# Patient Record
Sex: Female | Born: 1940 | Race: White | Hispanic: No | Smoking: Former smoker
Health system: Southern US, Community
[De-identification: ages and names within clinical notes are randomized; demographics above are authoritative.]

## PROBLEM LIST (undated history)

## (undated) DIAGNOSIS — C801 Malignant (primary) neoplasm, unspecified: Secondary | ICD-10-CM

## (undated) DIAGNOSIS — I10 Essential (primary) hypertension: Secondary | ICD-10-CM

## (undated) DIAGNOSIS — E782 Mixed hyperlipidemia: Secondary | ICD-10-CM

## (undated) DIAGNOSIS — G629 Polyneuropathy, unspecified: Secondary | ICD-10-CM

## (undated) DIAGNOSIS — E538 Deficiency of other specified B group vitamins: Secondary | ICD-10-CM

## (undated) DIAGNOSIS — E119 Type 2 diabetes mellitus without complications: Secondary | ICD-10-CM

## (undated) DIAGNOSIS — I480 Paroxysmal atrial fibrillation: Secondary | ICD-10-CM

## (undated) DIAGNOSIS — I272 Pulmonary hypertension, unspecified: Secondary | ICD-10-CM

## (undated) DIAGNOSIS — J449 Chronic obstructive pulmonary disease, unspecified: Secondary | ICD-10-CM

## (undated) DIAGNOSIS — E049 Nontoxic goiter, unspecified: Secondary | ICD-10-CM

## (undated) DIAGNOSIS — Z9889 Other specified postprocedural states: Secondary | ICD-10-CM

## (undated) DIAGNOSIS — K279 Peptic ulcer, site unspecified, unspecified as acute or chronic, without hemorrhage or perforation: Secondary | ICD-10-CM

## (undated) DIAGNOSIS — Z853 Personal history of malignant neoplasm of breast: Secondary | ICD-10-CM

## (undated) DIAGNOSIS — K900 Celiac disease: Secondary | ICD-10-CM

## (undated) HISTORY — DX: Essential (primary) hypertension: I10

## (undated) HISTORY — PX: MASTECTOMY: SHX3

## (undated) HISTORY — PX: DILATION AND CURETTAGE OF UTERUS: SHX78

## (undated) HISTORY — DX: Type 2 diabetes mellitus without complications: E11.9

## (undated) HISTORY — DX: Paroxysmal atrial fibrillation: I48.0

## (undated) HISTORY — PX: HYSTEROSCOPY W/ ENDOMETRIAL ABLATION: SUR665

## (undated) HISTORY — DX: Peptic ulcer, site unspecified, unspecified as acute or chronic, without hemorrhage or perforation: K27.9

## (undated) HISTORY — DX: Other specified postprocedural states: Z98.890

## (undated) HISTORY — DX: Nontoxic goiter, unspecified: E04.9

## (undated) HISTORY — DX: Personal history of malignant neoplasm of breast: Z85.3

## (undated) HISTORY — DX: Celiac disease: K90.0

## (undated) HISTORY — DX: Pulmonary hypertension, unspecified: I27.20

## (undated) HISTORY — DX: Mixed hyperlipidemia: E78.2

## (undated) HISTORY — DX: Malignant (primary) neoplasm, unspecified: C80.1

## (undated) HISTORY — DX: Chronic obstructive pulmonary disease, unspecified: J44.9

---

## 1996-11-14 ENCOUNTER — Encounter: Payer: Self-pay | Admitting: Internal Medicine

## 2004-05-02 ENCOUNTER — Inpatient Hospital Stay (HOSPITAL_COMMUNITY): Admission: AD | Admit: 2004-05-02 | Discharge: 2004-05-03 | Payer: Self-pay | Admitting: Cardiology

## 2004-05-02 ENCOUNTER — Ambulatory Visit: Payer: Self-pay | Admitting: Cardiology

## 2004-05-21 ENCOUNTER — Ambulatory Visit: Payer: Self-pay | Admitting: Cardiology

## 2005-10-25 ENCOUNTER — Ambulatory Visit: Payer: Self-pay | Admitting: Internal Medicine

## 2009-01-16 ENCOUNTER — Encounter: Payer: Self-pay | Admitting: Internal Medicine

## 2009-01-19 ENCOUNTER — Encounter: Payer: Self-pay | Admitting: Internal Medicine

## 2009-01-23 ENCOUNTER — Ambulatory Visit: Payer: Self-pay | Admitting: Cardiology

## 2009-01-23 ENCOUNTER — Encounter: Payer: Self-pay | Admitting: Internal Medicine

## 2009-01-24 ENCOUNTER — Encounter: Payer: Self-pay | Admitting: Internal Medicine

## 2009-01-24 ENCOUNTER — Telehealth: Payer: Self-pay | Admitting: Internal Medicine

## 2009-01-26 ENCOUNTER — Encounter: Payer: Self-pay | Admitting: Internal Medicine

## 2009-01-26 DIAGNOSIS — E785 Hyperlipidemia, unspecified: Secondary | ICD-10-CM

## 2009-01-26 DIAGNOSIS — I4949 Other premature depolarization: Secondary | ICD-10-CM

## 2009-01-26 DIAGNOSIS — R002 Palpitations: Secondary | ICD-10-CM

## 2009-01-26 DIAGNOSIS — I10 Essential (primary) hypertension: Secondary | ICD-10-CM

## 2009-01-31 ENCOUNTER — Ambulatory Visit: Payer: Self-pay | Admitting: Internal Medicine

## 2009-01-31 DIAGNOSIS — E049 Nontoxic goiter, unspecified: Secondary | ICD-10-CM | POA: Insufficient documentation

## 2009-01-31 DIAGNOSIS — I2789 Other specified pulmonary heart diseases: Secondary | ICD-10-CM | POA: Insufficient documentation

## 2009-02-05 ENCOUNTER — Encounter: Payer: Self-pay | Admitting: Internal Medicine

## 2009-02-12 ENCOUNTER — Encounter: Payer: Self-pay | Admitting: Internal Medicine

## 2009-02-21 ENCOUNTER — Encounter: Payer: Self-pay | Admitting: Internal Medicine

## 2009-02-23 ENCOUNTER — Encounter: Payer: Self-pay | Admitting: Internal Medicine

## 2009-02-27 ENCOUNTER — Ambulatory Visit: Payer: Self-pay | Admitting: Internal Medicine

## 2009-02-27 DIAGNOSIS — R0602 Shortness of breath: Secondary | ICD-10-CM

## 2009-02-28 LAB — CONVERTED CEMR LAB
BUN: 14 mg/dL (ref 6–23)
Basophils Absolute: 0.1 10*3/uL (ref 0.0–0.1)
Basophils Relative: 1.5 % (ref 0.0–3.0)
CO2: 34 meq/L — ABNORMAL HIGH (ref 19–32)
Calcium: 9.2 mg/dL (ref 8.4–10.5)
Chloride: 98 meq/L (ref 96–112)
Creatinine, Ser: 0.7 mg/dL (ref 0.4–1.2)
Eosinophils Absolute: 0.4 10*3/uL (ref 0.0–0.7)
Eosinophils Relative: 4.5 % (ref 0.0–5.0)
GFR calc non Af Amer: 87.89 mL/min (ref >60)
Glucose, Bld: 201 mg/dL — ABNORMAL HIGH (ref 70–99)
HCT: 38.5 % (ref 36.0–46.0)
Hemoglobin: 12.6 g/dL (ref 12.0–15.0)
INR: 1.1 — ABNORMAL HIGH (ref 0.8–1.0)
Lymphocytes Relative: 17 % (ref 12.0–46.0)
Lymphs Abs: 1.3 10*3/uL (ref 0.7–4.0)
MCHC: 32.7 g/dL (ref 30.0–36.0)
MCV: 89.4 fL (ref 78.0–100.0)
Monocytes Absolute: 0.8 10*3/uL (ref 0.1–1.0)
Monocytes Relative: 10 % (ref 3.0–12.0)
Neutro Abs: 5.3 10*3/uL (ref 1.4–7.7)
Neutrophils Relative %: 67 % (ref 43.0–77.0)
Platelets: 247 10*3/uL (ref 150.0–400.0)
Potassium: 4 meq/L (ref 3.5–5.1)
Prothrombin Time: 11 s (ref 9.1–11.7)
RBC: 4.3 M/uL (ref 3.87–5.11)
RDW: 14.5 % (ref 11.5–14.6)
Sodium: 138 meq/L (ref 135–145)
WBC: 7.9 10*3/uL (ref 4.5–10.5)

## 2009-03-02 ENCOUNTER — Ambulatory Visit: Payer: Self-pay | Admitting: Internal Medicine

## 2009-03-02 ENCOUNTER — Inpatient Hospital Stay (HOSPITAL_BASED_OUTPATIENT_CLINIC_OR_DEPARTMENT_OTHER): Admission: RE | Admit: 2009-03-02 | Discharge: 2009-03-02 | Payer: Self-pay | Admitting: Internal Medicine

## 2009-03-09 ENCOUNTER — Telehealth: Payer: Self-pay | Admitting: Internal Medicine

## 2009-03-09 ENCOUNTER — Telehealth (INDEPENDENT_AMBULATORY_CARE_PROVIDER_SITE_OTHER): Payer: Self-pay | Admitting: *Deleted

## 2009-03-20 ENCOUNTER — Other Ambulatory Visit: Admission: RE | Admit: 2009-03-20 | Discharge: 2009-03-20 | Payer: Self-pay | Admitting: Interventional Radiology

## 2009-03-20 ENCOUNTER — Encounter: Payer: Self-pay | Admitting: Internal Medicine

## 2009-03-20 ENCOUNTER — Encounter: Admission: RE | Admit: 2009-03-20 | Discharge: 2009-03-20 | Payer: Self-pay | Admitting: Internal Medicine

## 2009-04-02 ENCOUNTER — Encounter: Payer: Self-pay | Admitting: Internal Medicine

## 2009-04-03 ENCOUNTER — Ambulatory Visit: Payer: Self-pay | Admitting: Internal Medicine

## 2009-04-03 DIAGNOSIS — R609 Edema, unspecified: Secondary | ICD-10-CM

## 2009-04-05 ENCOUNTER — Ambulatory Visit: Payer: Self-pay | Admitting: Cardiology

## 2009-04-05 ENCOUNTER — Encounter: Payer: Self-pay | Admitting: Internal Medicine

## 2009-04-06 LAB — CONVERTED CEMR LAB
ALT: 13 U/L
AST: 13 U/L
Albumin: 3.7 g/dL
Alkaline Phosphatase: 74 U/L
BUN: 14 mg/dL
Bilirubin, Direct: 0.1 mg/dL
CO2: 34 meq/L — ABNORMAL HIGH
Calcium: 9.5 mg/dL
Chloride: 100 meq/L
Creatinine, Ser: 0.8 mg/dL
GFR calc non Af Amer: 75.32 mL/min
Glucose, Bld: 217 mg/dL — ABNORMAL HIGH
Potassium: 4 meq/L
Pro B Natriuretic peptide (BNP): 224 pg/mL — ABNORMAL HIGH
Sodium: 138 meq/L
Total Bilirubin: 0.8 mg/dL
Total Protein: 6.3 g/dL

## 2009-04-24 ENCOUNTER — Encounter: Payer: Self-pay | Admitting: Internal Medicine

## 2009-06-11 ENCOUNTER — Ambulatory Visit: Payer: Self-pay | Admitting: Internal Medicine

## 2009-06-20 ENCOUNTER — Encounter: Payer: Self-pay | Admitting: Internal Medicine

## 2009-06-21 ENCOUNTER — Telehealth: Payer: Self-pay | Admitting: Internal Medicine

## 2009-07-10 ENCOUNTER — Ambulatory Visit: Payer: Self-pay | Admitting: Cardiology

## 2009-07-11 ENCOUNTER — Encounter: Payer: Self-pay | Admitting: Cardiology

## 2009-07-11 ENCOUNTER — Encounter (INDEPENDENT_AMBULATORY_CARE_PROVIDER_SITE_OTHER): Payer: Self-pay | Admitting: *Deleted

## 2009-07-13 ENCOUNTER — Ambulatory Visit: Payer: Self-pay | Admitting: Cardiology

## 2009-07-13 LAB — CONVERTED CEMR LAB: POC INR: 1.1

## 2009-07-16 ENCOUNTER — Encounter: Payer: Self-pay | Admitting: Cardiology

## 2009-07-17 ENCOUNTER — Ambulatory Visit: Payer: Self-pay | Admitting: Cardiology

## 2009-07-17 ENCOUNTER — Encounter: Payer: Self-pay | Admitting: Cardiology

## 2009-07-17 LAB — CONVERTED CEMR LAB: POC INR: 1.6

## 2009-07-19 ENCOUNTER — Encounter (INDEPENDENT_AMBULATORY_CARE_PROVIDER_SITE_OTHER): Payer: Self-pay | Admitting: *Deleted

## 2009-07-20 ENCOUNTER — Ambulatory Visit: Payer: Self-pay | Admitting: Cardiology

## 2009-07-20 LAB — CONVERTED CEMR LAB: POC INR: 2.6

## 2009-07-23 ENCOUNTER — Encounter: Payer: Self-pay | Admitting: Cardiology

## 2009-07-24 ENCOUNTER — Ambulatory Visit: Payer: Self-pay | Admitting: Cardiology

## 2009-07-24 LAB — CONVERTED CEMR LAB: POC INR: 2.3

## 2009-08-03 ENCOUNTER — Ambulatory Visit: Payer: Self-pay | Admitting: Cardiology

## 2009-08-03 LAB — CONVERTED CEMR LAB: POC INR: 2.8

## 2009-08-17 ENCOUNTER — Ambulatory Visit: Payer: Self-pay | Admitting: Cardiology

## 2009-08-17 LAB — CONVERTED CEMR LAB: POC INR: 2.3

## 2009-08-27 ENCOUNTER — Ambulatory Visit: Payer: Self-pay | Admitting: Cardiology

## 2009-08-27 DIAGNOSIS — I4891 Unspecified atrial fibrillation: Secondary | ICD-10-CM | POA: Insufficient documentation

## 2009-09-05 ENCOUNTER — Telehealth (INDEPENDENT_AMBULATORY_CARE_PROVIDER_SITE_OTHER): Payer: Self-pay | Admitting: *Deleted

## 2009-09-07 ENCOUNTER — Ambulatory Visit: Payer: Self-pay | Admitting: Cardiology

## 2009-09-07 LAB — CONVERTED CEMR LAB: POC INR: 3.1

## 2009-09-12 ENCOUNTER — Ambulatory Visit: Payer: Self-pay | Admitting: Internal Medicine

## 2009-09-18 ENCOUNTER — Telehealth (INDEPENDENT_AMBULATORY_CARE_PROVIDER_SITE_OTHER): Payer: Self-pay | Admitting: *Deleted

## 2009-09-18 ENCOUNTER — Ambulatory Visit: Payer: Self-pay | Admitting: Cardiology

## 2009-09-26 ENCOUNTER — Ambulatory Visit: Payer: Self-pay

## 2009-09-26 ENCOUNTER — Ambulatory Visit: Payer: Self-pay | Admitting: Cardiovascular Disease

## 2009-09-26 ENCOUNTER — Ambulatory Visit (HOSPITAL_COMMUNITY): Admission: RE | Admit: 2009-09-26 | Discharge: 2009-09-26 | Payer: Self-pay | Admitting: Internal Medicine

## 2009-09-28 ENCOUNTER — Ambulatory Visit: Payer: Self-pay | Admitting: Cardiology

## 2009-09-28 LAB — CONVERTED CEMR LAB: POC INR: 4.2

## 2009-10-12 ENCOUNTER — Ambulatory Visit: Payer: Self-pay | Admitting: Cardiology

## 2009-10-12 LAB — CONVERTED CEMR LAB: POC INR: 3.1

## 2009-11-02 ENCOUNTER — Ambulatory Visit: Payer: Self-pay | Admitting: Cardiology

## 2009-11-02 LAB — CONVERTED CEMR LAB: POC INR: 2.6

## 2009-11-23 ENCOUNTER — Ambulatory Visit: Payer: Self-pay | Admitting: Cardiology

## 2009-11-23 LAB — CONVERTED CEMR LAB: POC INR: 3.2

## 2009-12-14 ENCOUNTER — Ambulatory Visit: Payer: Self-pay | Admitting: Cardiology

## 2009-12-14 LAB — CONVERTED CEMR LAB: POC INR: 1.9

## 2009-12-25 ENCOUNTER — Ambulatory Visit: Payer: Self-pay | Admitting: Cardiology

## 2009-12-25 ENCOUNTER — Ambulatory Visit: Payer: Self-pay | Admitting: Internal Medicine

## 2009-12-25 LAB — CONVERTED CEMR LAB: POC INR: 2.1

## 2010-01-01 ENCOUNTER — Encounter: Payer: Self-pay | Admitting: Internal Medicine

## 2010-01-06 ENCOUNTER — Telehealth (INDEPENDENT_AMBULATORY_CARE_PROVIDER_SITE_OTHER): Payer: Self-pay | Admitting: *Deleted

## 2010-01-08 ENCOUNTER — Ambulatory Visit: Payer: Self-pay | Admitting: Internal Medicine

## 2010-01-08 ENCOUNTER — Ambulatory Visit: Admission: RE | Admit: 2010-01-08 | Payer: Self-pay | Source: Home / Self Care | Admitting: Internal Medicine

## 2010-01-09 ENCOUNTER — Telehealth: Payer: Self-pay | Admitting: Internal Medicine

## 2010-01-18 ENCOUNTER — Ambulatory Visit: Payer: Self-pay

## 2010-01-22 ENCOUNTER — Encounter: Payer: Self-pay | Admitting: Internal Medicine

## 2010-02-12 ENCOUNTER — Telehealth: Payer: Self-pay | Admitting: Internal Medicine

## 2010-02-15 LAB — BASIC METABOLIC PANEL
BUN: 14 mg/dL (ref 6–23)
CO2: 30 mEq/L (ref 19–32)
Calcium: 9.4 mg/dL (ref 8.4–10.5)
Chloride: 99 mEq/L (ref 96–112)
Creatinine, Ser: 1.27 mg/dL — ABNORMAL HIGH (ref 0.4–1.2)
GFR calc Af Amer: 50 mL/min — ABNORMAL LOW (ref >60)
GFR calc non Af Amer: 41 mL/min — ABNORMAL LOW (ref >60)
Glucose, Bld: 187 mg/dL — ABNORMAL HIGH (ref 70–99)
Potassium: 4.8 mEq/L (ref 3.5–5.1)
Sodium: 136 mEq/L (ref 135–145)

## 2010-02-15 LAB — APTT: aPTT: 32 seconds (ref 24–37)

## 2010-02-15 LAB — CBC
HCT: 36.2 % (ref 36.0–46.0)
Hemoglobin: 11.7 g/dL — ABNORMAL LOW (ref 12.0–15.0)
MCH: 29.1 pg (ref 26.0–34.0)
MCHC: 32.3 g/dL (ref 30.0–36.0)
MCV: 90 fL (ref 78.0–100.0)
Platelets: 318 10*3/uL (ref 150–400)
RBC: 4.02 MIL/uL (ref 3.87–5.11)
RDW: 13 % (ref 11.5–15.5)
WBC: 10.6 10*3/uL — ABNORMAL HIGH (ref 4.0–10.5)

## 2010-02-15 LAB — PROTIME-INR
INR: 0.96 (ref 0.00–1.49)
Prothrombin Time: 13 seconds (ref 11.6–15.2)

## 2010-02-22 ENCOUNTER — Telehealth (INDEPENDENT_AMBULATORY_CARE_PROVIDER_SITE_OTHER): Payer: Self-pay | Admitting: *Deleted

## 2010-02-22 ENCOUNTER — Ambulatory Visit (HOSPITAL_COMMUNITY)
Admission: RE | Admit: 2010-02-22 | Discharge: 2010-02-22 | Payer: Self-pay | Source: Home / Self Care | Attending: Obstetrics and Gynecology | Admitting: Obstetrics and Gynecology

## 2010-02-25 LAB — GLUCOSE, CAPILLARY
Glucose-Capillary: 215 mg/dL — ABNORMAL HIGH (ref 70–99)
Glucose-Capillary: 178 mg/dL — ABNORMAL HIGH (ref 70–99)

## 2010-03-01 NOTE — Op Note (Signed)
  NAMEPENNYE, Wanda [name]              ACCOUNT NO.:  [id number]  MEDICAL RECORD NO.:  [id number]          PATIENT TYPE:  AMB  LOCATION:  [place]                           FACILITY:  [facility]  PHYSICIAN:  Lana Flaim L. Spyros Winch, M.D.DATE OF BIRTH:  [birth year]  DATE OF PROCEDURE:  02/22/2010 DATE OF DISCHARGE:                              OPERATIVE REPORT   PREOPERATIVE DIAGNOSIS:  Postmenopausal bleeding.  POSTOPERATIVE DIAGNOSES:  Postmenopausal bleeding, endometrial polyp and submucosal fibroid.  PROCEDURE:  Dilation and curettage, hysteroscopy, ThermaChoice endometrial ablation.  SURGEON:  Trini Christiansen L. Helane Rima, MD  ANESTHESIA:  MAC with paracervical block.  FINDINGS:  Multiple endometrial polyps and submucosal fibroids sent to Pathology.  ESTIMATED BLOOD LOSS:  Minimal.  COMPLICATIONS:  None.  DESCRIPTION OF PROCEDURE:  The patient was taken to the operating room. She was prepped and draped.  In-and-out catheter was used to empty the bladder.  Speculum was inserted into the vagina.  The cervix was grasped with a tenaculum.  Paracervical block was performed.  The cervical internal os was gently dilated using Pratt dilators and the hysteroscope was inserted.  The uterine cavity was large, there were several small to medium-sized polyps and there was a very large submucosal fibroid that took up almost the posterior wall of the uterus and the hysteroscope was removed and I performed uterine curettage with a curette, retrieving a moderate amount of tissue in use of polyp forceps multiple times to remove all of the polypoid tissue.  I then reinserted the hysteroscope and noted that the uterine cavity was cleaned except for the submucosal fibroid which was way too large to resect by the hysteroscope.  I then inserted the ThermaChoice III machine and performed a ThermaChoice endometrial ablation according to the manufacturer's specifications for an 8-minute cycle.  At the end of the procedure,  the intact balloon was removed, all instruments were removed from the vagina.  All sponge, lap, and instrument counts were correct x2.  The patient went to recovery room in stable condition.     Malayna Noori L. Helane Rima, M.D.    Nevin Bloodgood  D:  02/22/2010  T:  02/22/2010  Job:  [id number]  Electronically Signed by Dian Queen M.D. on 03/01/2010 08:43:58 AM

## 2010-03-05 ENCOUNTER — Ambulatory Visit: Admission: RE | Admit: 2010-03-05 | Discharge: 2010-03-05 | Payer: Self-pay | Source: Home / Self Care

## 2010-03-12 NOTE — Medication Information (Signed)
Summary: ccr-lr  Anticoagulant Therapy  Managed by: Edrick Oh, RN PCP: Lennie Hummer Supervising MD: Dannielle Burn MD, Luvenia Heller Indication 1: Atrial Fibrillation Lab Used: [facility] [place] Site: [place] INR POC 4.2  Dietary changes: no    Health status changes: no    Bleeding/hemorrhagic complications: no    Recent/future hospitalizations: no    Any changes in medication regimen? no    Recent/future dental: no  Any missed doses?: no       Is patient compliant with meds? yes       Allergies: 1)  ! * Dilaudid 2)  ! Pcn 3)  ! Prednisone 4)  ! * Gluten  Anticoagulation Management History:      The patient is taking warfarin and comes in today for a routine follow up visit.  Positive risk factors for bleeding include an age of 18 years or older.  The bleeding index is 'intermediate risk'.  Positive CHADS2 values include History of HTN.  Negative CHADS2 values include Age > 67 years old.  Her last INR was 1.1 ratio.  Anticoagulation responsible provider: Dannielle Burn MD, Luvenia Heller.  INR POC: 4.2.  Cuvette Lot#: [id number].  Exp: 09/2010.    Anticoagulation Management Assessment/Plan:      The patient's current anticoagulation dose is Warfarin sodium 5 mg tabs: use as directed per Anticoagulative Clinic.  The target INR is 2.0-3.0.  The next INR is due 10/12/2009.  Anticoagulation instructions were given to patient.  Results were reviewed/authorized by Edrick Oh, RN.  She was notified by Edrick Oh RN.         Prior Anticoagulation Instructions: INR 3.1 Take coumadin 1/2 tablet tonight then resume 1 1/2 tablets once daily   Current Anticoagulation Instructions: INR 4.2 Hold coumadin tonight then decrease dose to 7.43m once daily except 540mon Tuesdays and Saturdays

## 2010-03-12 NOTE — Medication Information (Signed)
Summary: ccr-lr  Anticoagulant Therapy  Managed by: Edrick Oh, RN PCP: Lennie Hummer Supervising MD: Dannielle Burn MD, Luvenia Heller Indication 1: Atrial Fibrillation Lab Used: [facility] [place] Site: [place] INR POC 2.3  Dietary changes: no    Health status changes: no    Bleeding/hemorrhagic complications: no    Recent/future hospitalizations: no    Any changes in medication regimen? no    Recent/future dental: no  Any missed doses?: no       Is patient compliant with meds? yes       Allergies: 1)  ! * Dilaudid 2)  ! Pcn 3)  ! Prednisone 4)  ! * Gluten  Anticoagulation Management History:      The patient is taking warfarin and comes in today for a routine follow up visit.  Positive risk factors for bleeding include an age of 72 years or older.  The bleeding index is 'intermediate risk'.  Positive CHADS2 values include History of HTN.  Negative CHADS2 values include Age > 3 years old.  Her last INR was 1.1 ratio.  Anticoagulation responsible provider: Dannielle Burn MD, Luvenia Heller.  INR POC: 2.3.  Cuvette Lot#: [id number].    Anticoagulation Management Assessment/Plan:      The next INR is due 08/03/2009.  Anticoagulation instructions were given to patient.  Results were reviewed/authorized by Edrick Oh, RN.  She was notified by Edrick Oh RN.        Coagulation management information includes: Not pending DCCV at this time.  Prior Anticoagulation Instructions: INR 2.6 Continue coumadin 7.50m once daily  Stop Lovenox Recheck INR 07/24/09  Current Anticoagulation Instructions: INR 2.3 Continue coumadin 7.584monce daily

## 2010-03-12 NOTE — Medication Information (Signed)
Summary: ccr-lr  Anticoagulant Therapy  Managed by: Porfirio Oar, PharmD PCP: Lennie Hummer Supervising MD: Dannielle Burn MD, Luvenia Heller Indication 1: Atrial Fibrillation Lab Used: [facility] [place] Site: [place] INR POC 2.8  Dietary changes: no    Health status changes: no    Bleeding/hemorrhagic complications: no    Recent/future hospitalizations: no    Any changes in medication regimen? no    Recent/future dental: no  Any missed doses?: no       Is patient compliant with meds? yes       Allergies: 1)  ! * Dilaudid 2)  ! Pcn 3)  ! Prednisone 4)  ! * Gluten  Anticoagulation Management History:      The patient is taking warfarin and comes in today for a routine follow up visit.  Positive risk factors for bleeding include an age of 72 years or older.  The bleeding index is 'intermediate risk'.  Positive CHADS2 values include History of HTN.  Negative CHADS2 values include Age > 51 years old.  Her last INR was 1.1 ratio.  Anticoagulation responsible provider: Dannielle Burn MD, Luvenia Heller.  INR POC: 2.8.  Cuvette Lot#: [id number].  Exp: 09/2010.    Anticoagulation Management Assessment/Plan:      The next INR is due 08/17/2009.  Anticoagulation instructions were given to patient.  Results were reviewed/authorized by Porfirio Oar, PharmD.  She was notified by Porfirio Oar PharmD.         Prior Anticoagulation Instructions: INR 2.3 Continue coumadin 7.10m once daily   Current Anticoagulation Instructions: INR 2.8  Continue same dose of 1 1/2 tablets every day.

## 2010-03-12 NOTE — Letter (Signed)
Summary: [facility] D/C DR. DHRUV VYAS  [facility] D/C DR. DHRUV VYAS   Imported By: Delfino Lovett 07/24/2009 12:25:44  _____________________________________________________________________  External Attachment:    Type:   Image     Comment:   External Document

## 2010-03-12 NOTE — Progress Notes (Signed)
Summary: PATIENT C/O BEING IN A-FIB  Phone Note Call from Patient Call back at Home Phone [phone]   Summary of Call: patient called saying she thinks she is in a-fib with HR 126, and she just felt different. Nurse instructed patient to go to ED for evaluation. patient verbalized understanding of plan. Initial call taken by: Georgina Peer,  September 18, 2009 10:38 AM

## 2010-03-12 NOTE — Progress Notes (Signed)
Summary: Cardiology - Epistaxis and Vaginal Bleeding  Phone Note Call from Patient Call back at Home Phone [phone]   Caller: Patient Reason for Call: Talk to Doctor Summary of Call: Returned call from pt concerning vaginal bleeding x3 days and blood from her nose.  Pt states that she has had vaginal bleeding when she wipes for the past 3 days or so.  Denies toilet being filled with blood.  She has recently been treated for a UTI but has finished her abx and denies any pain with urination.  Pt also c/o several drops of blood from her nose when she wipes her nose.  Pt is on 3 L of O2 per n/c.  This occured 2-3 times while pt was sitting in church.  She has had no further episodes of epistaxis.  Pt is on coumadin for Afib and had an INR drawn on 11/22 which was 2.3.  She denies any change in medication and states complaince with coumadin instructions.  Pt is also scheduled for a RHC on 11/29.  Therefore, I have advised the patient to hold her coumadin tonight and have her INR redrawn in the morning.  Pt is willing to drive to [place] if necessary.  If INR is ok then I would suggest obtaining a UA to ensure UTI was cleared with abx.  I also feel that the epistaxis may be from the use of oxygen but again pt will have INR drawn to ensure it is not supratherapeutic.  Pt voiced understanding and appreciated the call back.  If she does not hear from our office by noon I have asked her to call.  Pt will report to the ER if bleeding from her nose or vagina can not be stopped.  Initial call taken by: Teressa Senter NP-PA,  January 06, 2010 8:18 PM     Appended Document: Cardiology - Epistaxis and Vaginal Bleeding Called pt.  Nose bleeds and vaginal bleeding has improved although she still has a little of both.  Pt does not want to come in for INR check today because she is scheduled for heart cath in the morning at 8:00am and INR will be checked there.  Appended Document: Cardiology - Epistaxis and  Vaginal Bleeding Will hold coumadin. i called Dr. Dian Queen this am to discuss. They will see her very soon.

## 2010-03-12 NOTE — Letter (Signed)
Summary: Risk analyst at [facility]. [address]   [place], [place] [address]   Phone: [phone]  Fax: [phone]        July 19, 2009 MRN: [id number]   [facility] [address] [place], [place]  [address]   Dear Ms. Mcgregory,  Your test ordered by Rande Lawman has been reviewed by your physician (or physician assistant) and was found to be normal or stable. Your physician (or physician assistant) felt no changes were needed at this time.  ____ Echocardiogram  ____ Cardiac Stress Test  ____ Lab Work  ____ Peripheral vascular study of arms, legs or neck  ____ CT scan or X-ray  ____ Lung or Breathing test  __X__ Other:  1)  hemoccult cards negative x 3 for blood                      2)  EKG within normal limits and maintaining normal sinus rhythm per Dr. Dannielle Burn.   Thank you.   Lovina Reach, LPN    Bryon Lions, M.D., F.A.C.C. Maceo Pro, M.D., F.A.C.C. Cammy Copa, M.D., F.A.C.C. Vonda Antigua, M.D., F.A.C.C. Vita Barley, M.D., F.A.C.C. Mare Ferrari, M.D., F.A.C.C. Emi Belfast, PA-C

## 2010-03-12 NOTE — Assessment & Plan Note (Signed)
Summary: 6 WK FU -PAT.CHECK OUT. 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 The patient also has a history of obesity, hypertension diabetes mellitus and COPD. Her DLCO is 43%. She has combined obstructive and restrictive lung disease. She has significant hypoxemia with hypercarbia at rest and is on oxygen therapy.PA pressures have been measured at 83 meters of mercury systolic. PVR 4.9 which units. LV function is within normal limits.  The patient was admitted several months ago with new onset atrial fibrillation associated with presyncope.in the interim she has gone in and out of atrial fibrillation. However her symptoms of dyspnea have not worsened after rate control was obtained. The patient previously that I would have no plans on starting her on antiarrhythmic drug therapy. She is symptomatic from her pulmonary hypertension but not from paroxysmal atrial fibrillation with rate controlled currently.her blood pressure also is increased we did stop her ACE inhibitor and ARB during her hospitalization due to hypotension. I told her today that we didn't restart this.  The patient's compliant with Coumadin therapy and reports no complications.  Preventive Screening-Counseling & Management  Alcohol-Tobacco     Smoking Status: quit     Year Quit: 1997  Current Medications (verified): 1)  Chlorthalidone 25 Mg Tabs (Chlorthalidone) .Marland Kitchen.. 1 Tab Once Daily 2)  Aspirin 81 Mg Tbec (Aspirin) .... Take One Tablet By Mouth Daily 3)  Toprol Xl 100 Mg Xr24h-Tab (Metoprolol Succinate) .Marland Kitchen.. 1 Tab Once Daily 4)  Fluoxetine Hcl 40 Mg Caps (Fluoxetine Hcl) .Marland Kitchen.. 1 Cap Once Daily 5)  Actos 30 Mg Tabs (Pioglitazone Hcl) .Marland Kitchen.. 1 Tab Once Daily 6)   Omeprazole 20 Mg Tbec (Omeprazole) .Marland Kitchen.. 1 Tab Two Times A Day 7)  Spironolactone 25 Mg Tabs (Spironolactone) .... Take One Tablet By Mouth Daily 8)  Alprazolam 1 Mg Tabs (Alprazolam) .... At Bedtime 9)  Tybaso Nebulizer .... Qid 10)  Digoxin 0.125 Mg Tabs (Digoxin) .... Take 1 Tablet By Mouth Every Other Day 11)  Lovenox 100 Mg/ml Soln (Enoxaparin Sodium) .... Inject 181m Subcutaneously As Directed Two Times A Day 12)  Warfarin Sodium 5 Mg Tabs (Warfarin Sodium) .... Use As Directed Per Anticoagulative Clinic 13)  Tyvaso 0.6 Mg/ml Soln (Treprostinil) .... 9 Breaths Four Times A Day Per Nebulizer 14)  Lisinopril 20 Mg Tabs (Lisinopril) .... Take 1 Tablet By Mouth Once A Day  Allergies (verified): 1)  ! * Dilaudid 2)  ! Pcn 3)  ! Prednisone 4)  ! * Gluten  Comments:  Nurse/Medical Assistant: The patient's medication list and allergies were reviewed with the patient and were updated in the Medication and Allergy Lists.  Past History:  Past Medical History: Last updated: 04/03/2009 1. Hypertension, severe 2. Hyperlipidemia 3. PVCs 4. 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  Pulmoanry HTN      -- Right atrial pressure mean of 11, RV pressure 84/6 with an EDP of 18, PA pressure 83/28 with a mean of 49.  Pulmonary capillary wedge pressure 15.  Fick cardiac output 7.0 L/min.  Cardiac index 3.0 L/min/m2.  Pulmonary vascular resistance is 4.9 Woods units. 5. Diabetes 6. hx of breast cancer s/p L mastectomy 7. Celiac disease 8. Depression 9. COPD 10. Cor pulmonale 11. Morbid obesity 12. PUD  Family History: Last updated:  01/31/2009 Non-contributory  Social History: Last updated: 01/31/2009 Widowed. Former Therapist, art. Retired. H/o heavy tobaccu use. Quit 1997. No signifcant ETOH.   Social History: Smoking Status:  quit  Review of Systems       The patient complains of palpitations and shortness of breath.  The patient denies fatigue, malaise, fever, weight gain/loss,  vision loss, decreased hearing, hoarseness, chest pain, prolonged cough, wheezing, sleep apnea, coughing up blood, abdominal pain, blood in stool, nausea, vomiting, diarrhea, heartburn, incontinence, blood in urine, muscle weakness, joint pain, leg swelling, rash, skin lesions, headache, fainting, dizziness, depression, anxiety, enlarged lymph nodes, easy bruising or bleeding, and environmental allergies.    Vital Signs:  Patient profile:   70 year old female Height:      67 inches Weight:      255 pounds O2 Sat:      91 % on 2 L/min Pulse rate:   56 / minute BP sitting:   176 / 89  (left arm) Cuff size:   large  Vitals Entered By: Georgina Peer (August 27, 2009 10:00 AM)  O2 Flow:  2 L/min  Serial Vital Signs/Assessments:  Time      Position  BP       Pulse  Resp  Temp     By 10:06 AM            163/88   56                    Lydia Anderson                                PEF    PreRx  PostRx Time      O2 Sat  O2 Type     L/min  L/min  L/min   By 10:06 AM  92  %   2 L/min                           Georgina Peer   Physical Exam  Additional Exam:  General: Well-developed, well-nourished in no distress, wearing oxygen head: Normocephalic and atraumatic eyes PERRLA/EOMI intact, conjunctiva and lids normal nose: No deformity or lesions mouth normal dentition, normal posterior pharynx neck: Supple, no JVD.  No masses, thyromegaly or abnormal cervical nodes lungs: Normal breath sounds bilaterally without wheezing.  Normal percussion heart: Irregular rate and rhythm with normal S1 and S2, no S3 or S4.  PMI is normal.  No pathological murmurs abdomen: Normal bowel sounds, abdomen is soft and nontender without masses, organomegaly or hernias noted.  No hepatosplenomegaly musculoskeletal: Back normal, normal gait muscle strength and tone normal pulsus: Pulse is normal in all 4 extremities Extremities: trace peripheral pitting edema neurologic: Alert and oriented x 3 skin: Intact without  lesions or rashes cervical nodes: No significant adenopathy psychologic: Normal affect    EKG  Procedure date:  08/27/2009  Findings:      atrial fibrillation. Heart rate 62 beats per minute. No significant ST-T wave changes.  Impression & Recommendations:  Problem # 1:  ATRIAL FIBRILLATION (ICD-427.31) the patient is being paroxysmal atrial fibrillation. She is in atrial fibrillation the clinic today.however her rate is controlled and I will make no medication changes. Her updated medication list for this problem includes:    Aspirin 81 Mg Tbec (Aspirin) .Marland Kitchen... Take one tablet by mouth daily    Toprol Xl 100 Mg  Xr24h-tab (Metoprolol succinate) .Marland Kitchen... 1 tab once daily    Digoxin 0.125 Mg Tabs (Digoxin) .Marland Kitchen... Take 1 tablet by mouth every other day    Warfarin Sodium 5 Mg Tabs (Warfarin sodium) ..... Use as directed per anticoagulative clinic  Problem # 2:  PULMONARY HYPERTENSION (ICD-416.8) the patient is a treprostenil and further up titration of combination therapy will be decided by the [facility]  office.  Problem # 3:  COUMADIN THERAPY (ICD-V58.61) Assessment: Comment Only  Other Orders: EKG w/ Interpretation (93000)  Patient Instructions: 1)  Lisinopril 76m  2)  Follow up in  6 months Prescriptions: LISINOPRIL 20 MG TABS (LISINOPRIL) Take 1 tablet by mouth once a day  #30 x 6   Entered by:   GLovina Reach LPN   Authorized by:   GTerald Sleeper MD, [facility]  Signed by:   GLovina Reach LPN on 058/44/6520  Method used:   [facility]        [facility] [phone] (retail)       [address]  [address]      Ph: [phone]      Fax: [phone]  RxID:   [id number]

## 2010-03-12 NOTE — Medication Information (Signed)
Summary: [facility]  Anticoagulant Therapy  Managed by: Edrick Oh, RN PCP: Lennie Hummer Supervising MD: Dannielle Burn MD, Luvenia Heller Indication 1: Atrial Fibrillation Lab Used: [facility] [place] Site: [place] INR POC 2.1  Dietary changes: no    Health status changes: no    Bleeding/hemorrhagic complications: no    Recent/future hospitalizations: no    Any changes in medication regimen? no    Recent/future dental: no  Any missed doses?: no       Is patient compliant with meds? yes       Allergies: 1)  ! * Dilaudid 2)  ! Pcn 3)  ! Prednisone 4)  ! * Gluten  Anticoagulation Management History:      The patient is taking warfarin and comes in today for a routine follow up visit.  Positive risk factors for bleeding include an age of 72 years or older.  The bleeding index is 'intermediate risk'.  Positive CHADS2 values include History of HTN.  Negative CHADS2 values include Age > 72 years old.  Her last INR was 1.1 ratio.  Anticoagulation responsible provider: Dannielle Burn MD, Luvenia Heller.  INR POC: 2.1.  Cuvette Lot#: [id number].  Exp: 09/2010.    Anticoagulation Management Assessment/Plan:      The patient's current anticoagulation dose is Warfarin sodium 5 mg tabs: use as directed per Anticoagulative Clinic.  The target INR is 2.0-3.0.  The next INR is due 01/18/2010.  Anticoagulation instructions were given to patient.  Results were reviewed/authorized by Edrick Oh, RN.  She was notified by Edrick Oh RN.         Prior Anticoagulation Instructions: INR 1.9 Take coumadin 2 tablets tonight then resume 7.6m once daily except 517mon T,Th,Sat Will be starting Abx today for UTI  Current Anticoagulation Instructions: INR 2.1 Continue coumadin 7.58m3mnce daily except 58mg60m T,Th,Sat Cath scheduled for 01/08/10 Recheck INR 01/18/10

## 2010-03-12 NOTE — Medication Information (Signed)
Summary: ccr-lr  Anticoagulant Therapy  Managed by: Edrick Oh, RN PCP: Lennie Hummer Supervising MD: Domenic Polite MD, Mikeal Hawthorne Indication 1: Atrial Fibrillation Lab Used: [facility] [place] Site: [place] INR POC 3.1  Dietary changes: no    Health status changes: no    Bleeding/hemorrhagic complications: no    Recent/future hospitalizations: no    Any changes in medication regimen? no    Recent/future dental: no  Any missed doses?: no       Is patient compliant with meds? yes       Allergies: 1)  ! * Dilaudid 2)  ! Pcn 3)  ! Prednisone 4)  ! * Gluten  Anticoagulation Management History:      The patient is taking warfarin and comes in today for a routine follow up visit.  Positive risk factors for bleeding include an age of 72 years or older.  The bleeding index is 'intermediate risk'.  Positive CHADS2 values include History of HTN.  Negative CHADS2 values include Age > 33 years old.  Her last INR was 1.1 ratio.  Anticoagulation responsible provider: Domenic Polite MD, Mikeal Hawthorne.  INR POC: 3.1.  Cuvette Lot#: [id number].  Exp: 09/2010.    Anticoagulation Management Assessment/Plan:      The patient's current anticoagulation dose is Warfarin sodium 5 mg tabs: use as directed per Anticoagulative Clinic.  The target INR is 2.0-3.0.  The next INR is due 11/02/2009.  Anticoagulation instructions were given to patient.  Results were reviewed/authorized by Edrick Oh, RN.  She was notified by Edrick Oh RN.         Prior Anticoagulation Instructions: INR 4.2 Hold coumadin tonight then decrease dose to 7.60m once daily except 529mon Tuesdays and Saturdays  Current Anticoagulation Instructions: INR 3.1 Take coumadin 1 tablet tonight then resume 1 1/2 tablets once daily except 1 tablet on Tuesdays and Saturdays

## 2010-03-12 NOTE — Letter (Signed)
[facility]   Imported By: Marilynne Drivers 02/12/2009 10:09:00  _____________________________________________________________________  External Attachment:    Type:   Image     Comment:   External Document  Appended Document: [facility] - I discussed with Dr. Koleen Nimrod and he would like [name] to do RHC to get clearer assessment of volume status and pulmonary pressures. This is reasonable. Can you arrange with her?  thanks  Appended Document: [facility] cath sch for 1/21, pt aware

## 2010-03-12 NOTE — Assessment & Plan Note (Signed)
Summary: PER CHECK OUT/SF   Visit Type:  Follow-up Referring Provider:  Linde Gillis Primary Provider:  Lennie Hummer  CC:  no complaints.  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 we have been following her for pulmonary hypertension.  Found to be hypoxemic in Dec 2010 started on O2.  CT chest 12/10: No PE. Elevation of R hemidiaphragm. Areas of centrilobular emphysema. atelectasis in both bases. Large left lobe of thyroid with trachea deviation.  Echo 01/23/09: 55-60% mild LVH with pseudonormalizaiton. Mild to moderate MR. RV mild to moderately dilated. Mild RV dysfunction. Mild AS mean 62m HG.  RSVP 67  PFTs FEV1 1.23L (50%) FEV 1.81 (53%) DLCO 43% predicted.   Cath 1/11: RA 11, RV 84/6 with an EDP of 18, PA 83/28 with a mean of 49. PCWP 15  Fick cardiac output 7.0 L/min.  Cardiac index 3.0  PVR is 4.9 Woods units.  Started on Tyvaso in March 2011. Using 4 times a day. Doing much better. Was with her friends over the weeking at [employer] Was able to keep up with her friends while walking. Continues to go to pulmonary rehab and can walk 1/2 mile uses 6L of O2.   No edema or palpitations. BP at rehab 110-115/50-60s.   Current Medications (verified): 1)  Chlorthalidone 25 Mg Tabs (Chlorthalidone) ..Marland Kitchen. 1 Tab Once Daily 2)  Aspirin 81 Mg Tbec (Aspirin) .... Take One Tablet By Mouth Daily 3)  Lisinopril 40 Mg Tabs (Lisinopril) ..Marland Kitchen. 1 Tab Once Daily 4)  Toprol Xl 100 Mg Xr24h-Tab (Metoprolol Succinate) ..Marland Kitchen. 1 Tab Once Daily 5)  Diovan 320 Mg Tabs (Valsartan) ..Marland Kitchen. 1 Tab Once Daily 6)  Fluoxetine Hcl 40 Mg Caps (Fluoxetine Hcl) ..Marland Kitchen. 1 Cap Once Daily 7)  Actos 30 Mg Tabs (Pioglitazone Hcl) ..Marland Kitchen. 1 Tab Once Daily 8)  Omeprazole 20 Mg Tbec (Omeprazole) ..Marland Kitchen. 1 Tab Two Times A Day 9)  Spiriva Handihaler 18 Mcg Caps (Tiotropium Bromide Monohydrate)  .... Use As Directed At Bedtime 10)  Ventolin Hfa 108 (90 Base) Mcg/act Aers (Albuterol Sulfate) .... Use As Directed Three Times A Day 11)  Spironolactone 25 Mg Tabs (Spironolactone) .... Take One Tablet By Mouth Daily 12)  Alprazolam 1 Mg Tabs (Alprazolam) .... At Bedtime 13)  Tybaso Nebulizer .... Qid  Allergies (verified): 1)  ! * Dilaudid 2)  ! Pcn 3)  ! Prednisone 4)  ! * Gluten  Past History:  Past Medical History: Last updated: 04/03/2009 1. Hypertension, severe 2. Hyperlipidemia 3. PVCs 4. 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  Pulmoanry HTN      -- Right atrial pressure mean of 11, RV pressure 84/6 with an EDP of 18, PA pressure 83/28 with a mean of 49.  Pulmonary capillary wedge pressure 15.  Fick cardiac output 7.0 L/min.  Cardiac index 3.0 L/min/m2.  Pulmonary vascular resistance is 4.9 Woods units. 5. Diabetes 6. hx of breast cancer s/p L mastectomy 7. Celiac disease 8. Depression 9. COPD 10. Cor pulmonale 11. Morbid obesity 12. PUD  Review of Systems       As per HPI and past medical history; otherwise all systems negative.   Vital Signs:  Patient profile:   70year old female Height:      69 inches Weight:      253 pounds BMI:     37.50 Pulse rate:   54 /  minute Resp:     16 per minute BP sitting:   172 / 86  (right arm)  Vitals Entered By: Levora Angel, CNA (Jun 11, 2009 10:53 AM)  Physical Exam  General:  looks good. Walks briskly with O2. no resp difficulty.  HEENT: normal. + nasal cannula Neck: supple. JVP 6. Carotids 2+ bilat; no bruits. No lymphadenopathy or thryomegaly appreciated. Cor: PMI nondisplaced. Regular rate & rhythm. No rubs, gallops, 2/6 AS 2/6 TR  no RV heave + Left mastectomy Lungs: clear Abdomen: obese soft, nontender, nondistended. No hepatosplenomegaly. No bruits or masses. Good bowel sounds. Extremities: no cyanosis, clubbing, rash, edema Neuro: alert & orientedx3, cranial nerves grossly intact. moves all 4 extremities  w/o difficulty. affect pleasant    Impression & Recommendations:  Problem # 1:  PULMONARY HYPERTENSION (ICD-416.8) Doing great with Tyvaso. Now NYHA class II. Continue current therapy and pulmonary rehab. Follow 6 MW time with Dr. Koleen Nimrod.  Problem # 2:  HYPERTENSION, UNSPECIFIED (ICD-401.9) BP up today which is unusual for her. Will follow in pulmonary rehab. If still up can get back to [name] to adjust.   Other Orders: EKG w/ Interpretation (93000)  Patient Instructions: 1)  Follow up in 3 months

## 2010-03-12 NOTE — Medication Information (Signed)
Summary: CCN - SRS  Anticoagulant Therapy  Managed by: Edrick Oh, RN PCP: Lennie Hummer Supervising MD: Dannielle Burn MD, Luvenia Heller Indication 1: Atrial Fibrillation Lab Used: [facility] [place] Site: [place] INR POC 1.1  Dietary changes: no    Health status changes: no    Bleeding/hemorrhagic complications: no    Recent/future hospitalizations: yes       Details: Pt d/c from [facility] on 07/11/09  with new A Fib  Has H/O Pulm HTN  Any changes in medication regimen? yes       Details: Started on coumadin 28m qd   Has 588mtablet  Recent/future dental: no  Any missed doses?: no       Is patient compliant with meds? yes      Comments: coumadin teaching performed in the hospital.  Pt is a nurse.  Questions answered.  Allergies: 1)  ! * Dilaudid 2)  ! Pcn 3)  ! Prednisone 4)  ! * Gluten  Anticoagulation Management History:      The patient is taking warfarin and comes in today for a routine follow up visit.  Positive risk factors for bleeding include an age of [age]ears or older.  The bleeding index is 'intermediate risk'.  Positive CHADS2 values include History of HTN.  Negative CHADS2 values include Age > 7518ears old.  Her last INR was 1.1 ratio.  Anticoagulation responsible provider: DeDannielle BurnD, GuLuvenia Heller INR POC: 1.1.    Anticoagulation Management Assessment/Plan:      The next INR is due 07/17/2009.  Anticoagulation instructions were given to patient.  Results were reviewed/authorized by LiEdrick OhRN.  She was notified by LiEdrick OhN.         Current Anticoagulation Instructions: INR 1.1 Pt to take coumadin 1078monight and tomorrow night then increase dose to 7.5mg80mll next INR check on 07/17/09.

## 2010-03-12 NOTE — Medication Information (Signed)
Summary: ccr-lr  Anticoagulant Therapy  Managed by: Edrick Oh, RN PCP: Lennie Hummer Supervising MD: Dannielle Burn MD, Luvenia Heller Indication 1: Atrial Fibrillation Lab Used: [facility] [place] Site: [place] INR POC 2.6  Dietary changes: no    Health status changes: no    Bleeding/hemorrhagic complications: no    Recent/future hospitalizations: no    Any changes in medication regimen? no    Recent/future dental: no  Any missed doses?: no       Is patient compliant with meds? yes       Allergies: 1)  ! * Dilaudid 2)  ! Pcn 3)  ! Prednisone 4)  ! * Gluten  Anticoagulation Management History:      The patient is taking warfarin and comes in today for a routine follow up visit.  Positive risk factors for bleeding include an age of 72 years or older.  The bleeding index is 'intermediate risk'.  Positive CHADS2 values include History of HTN.  Negative CHADS2 values include Age > 28 years old.  Her last INR was 1.1 ratio.  Anticoagulation responsible provider: Dannielle Burn MD, Luvenia Heller.  INR POC: 2.6.  Cuvette Lot#: [id number].  Exp: 09/2010.    Anticoagulation Management Assessment/Plan:      The patient's current anticoagulation dose is Warfarin sodium 5 mg tabs: use as directed per Anticoagulative Clinic.  The target INR is 2.0-3.0.  The next INR is due 11/23/2009.  Anticoagulation instructions were given to patient.  Results were reviewed/authorized by Edrick Oh, RN.  She was notified by Edrick Oh RN.         Prior Anticoagulation Instructions: INR 3.1 Take coumadin 1 tablet tonight then resume 1 1/2 tablets once daily except 1 tablet on Tuesdays and Saturdays  Current Anticoagulation Instructions: INR 2.6 Continue coumadin 7.89m once daily except 541mon T,Sat

## 2010-03-12 NOTE — Miscellaneous (Signed)
Summary: Chronic DZ Progress Note  Chronic DZ Progress Note   Imported By: Sallee Provencal 05/21/2009 15:08:45  _____________________________________________________________________  External Attachment:    Type:   Image     Comment:   External Document

## 2010-03-12 NOTE — Progress Notes (Signed)
Summary: VERIFICATION OF MEDICATION(SPIRONOLACTON&CHLORTHALIDONE)  ---- Converted from flag ---- ---- 09/04/2009 6:03 PM, Terald Sleeper, MD, [facility] wrote: yes she is  ---- 09/04/2009 9:40 AM, Georgina Peer wrote: can you verify that she is to be on both spironolactone and chlorthalidone. thanks ------------------------------  Phone Note From Pharmacy   Caller: [facility] [phone]* Call For: nurse  Summary of Call: verification of patient being on spironolactone and chlorthalidone. Per MD,patientis on both. information faxed to pharmacy. Initial call taken by: Georgina Peer,  September 05, 2009 8:33 AM

## 2010-03-12 NOTE — Medication Information (Signed)
Summary: ccr-lr  Anticoagulant Therapy  Managed by: Edrick Oh, RN PCP: Lennie Hummer Supervising MD: Domenic Polite MD, Mikeal Hawthorne Indication 1: Atrial Fibrillation Lab Used: [facility] [place] Site: [place] INR POC 2.6  Dietary changes: no    Health status changes: no    Bleeding/hemorrhagic complications: no    Recent/future hospitalizations: no    Any changes in medication regimen? no    Recent/future dental: no  Any missed doses?: no       Is patient compliant with meds? yes       Allergies: 1)  ! * Dilaudid 2)  ! Pcn 3)  ! Prednisone 4)  ! * Gluten  Anticoagulation Management History:      The patient is taking warfarin and comes in today for a routine follow up visit.  Positive risk factors for bleeding include an age of 72 years or older.  The bleeding index is 'intermediate risk'.  Positive CHADS2 values include History of HTN.  Negative CHADS2 values include Age > 5 years old.  Her last INR was 1.1 ratio.  Anticoagulation responsible provider: Domenic Polite MD, Mikeal Hawthorne.  INR POC: 2.6.  Cuvette Lot#: [id number].    Anticoagulation Management Assessment/Plan:      The next INR is due 07/24/2009.  Anticoagulation instructions were given to patient.  Results were reviewed/authorized by Edrick Oh, RN.  She was notified by Edrick Oh RN.         Prior Anticoagulation Instructions: INR 1.6 Take coumadin 72m x 2 then resume 7.5106monce daily.  Recheck INR on 07/20/09 Continue lovenox  Current Anticoagulation Instructions: INR 2.6 Continue coumadin 7.35m46mnce daily  Stop Lovenox Recheck INR 07/24/09

## 2010-03-12 NOTE — Consult Note (Signed)
Summary: CARDIOLOGY CONSULT/ [facility]SULT/ [place]   Imported By: Delfino Lovett 07/16/2009 15:40:55  _____________________________________________________________________  External Attachment:    Type:   Image     Comment:   External Document

## 2010-03-12 NOTE — Medication Information (Signed)
Summary: ccr-lr  Anticoagulant Therapy  Managed by: Edrick Oh, RN PCP: Lennie Hummer Supervising MD: Domenic Polite MD, Mikeal Hawthorne Indication 1: Atrial Fibrillation Lab Used: [facility] [place] Site: [place] INR POC 3.1  Dietary changes: no    Health status changes: no    Bleeding/hemorrhagic complications: no    Recent/future hospitalizations: no    Any changes in medication regimen? no    Recent/future dental: no  Any missed doses?: no       Is patient compliant with meds? yes       Allergies: 1)  ! * Dilaudid 2)  ! Pcn 3)  ! Prednisone 4)  ! * Gluten  Anticoagulation Management History:      The patient is taking warfarin and comes in today for a routine follow up visit.  Positive risk factors for bleeding include an age of 68 years or older.  The bleeding index is 'intermediate risk'.  Positive CHADS2 values include History of HTN.  Negative CHADS2 values include Age > 6 years old.  Her last INR was 1.1 ratio.  Anticoagulation responsible provider: Domenic Polite MD, Mikeal Hawthorne.  INR POC: 3.1.  Exp: 09/2010.    Anticoagulation Management Assessment/Plan:      The patient's current anticoagulation dose is Warfarin sodium 5 mg tabs: use as directed per Anticoagulative Clinic.  The target INR is 2.0-3.0.  The next INR is due 09/28/2009.  Anticoagulation instructions were given to patient.  Results were reviewed/authorized by Edrick Oh, RN.  She was notified by Edrick Oh RN.         Prior Anticoagulation Instructions: INR 2.3 Continue coumadin 7.25m once daily   Current Anticoagulation Instructions: INR 3.1 Take coumadin 1/2 tablet tonight then resume 1 1/2 tablets once daily

## 2010-03-12 NOTE — Letter (Signed)
Summary: Cardiac Catheterization Instructions- [place], [place]  [address] [place]   [place],  [address]   Phone: [phone]  Fax: [phone]     02/23/2009 MRN: [id number]  [facility] [address] [place],   [address]  Dear Ms. Kagel,   You are scheduled for a Cardiac Catheterization on Fri 1/21 with  Dr. Haroldine Laws  Please arrive to the 1st floor of the Heart and Vascular Center at [facility] at 7:30 am / pm on the day of your procedure. Please do not arrive before 6:30 a.m. Call the Heart and Vascular Center at [phone] if you are unable to make your appointmnet. The Code to get into the parking garage under the building is 0090. Take the elevators to the 1st floor. You must have someone to drive you home. Someone must be with you for the first 24 hours after you arrive home. Please wear clothes that are easy to get on and off and wear slip-on shoes. Do not eat or drink after midnight except water with your medications that morning. Bring all your medications and current insurance cards with you.  _X__ DO NOT take these medications before your procedure: _______Chlorthalidone, Actos, Spironolactone  ___ Make sure you take your aspirin.  ___ You may take ALL of your medications with water that morning. ________________________________________________________________________________________________________________________________  ___ DO NOT take ANY medications before your procedure.  ___ Pre-med instructions:  ________________________________________________________________________________________________________________________________  The usual length of stay after your procedure is 2 to 3 hours. This can vary.  If you have any questions, please call the office at the number listed above.   Kevan Rosebush, RN

## 2010-03-12 NOTE — Medication Information (Signed)
Summary: ccr-lr  Anticoagulant Therapy  Managed by: Edrick Oh, RN PCP: Lennie Hummer Supervising MD: Dannielle Burn MD, Luvenia Heller Indication 1: Atrial Fibrillation Lab Used: [facility] [place] Site: [place] INR POC 1.6  Dietary changes: no    Health status changes: no    Bleeding/hemorrhagic complications: no    Recent/future hospitalizations: no    Any changes in medication regimen? yes       Details: still on Lovenox  Recent/future dental: no  Any missed doses?: no       Is patient compliant with meds? yes       Allergies: 1)  ! * Dilaudid 2)  ! Pcn 3)  ! Prednisone 4)  ! * Gluten  Anticoagulation Management History:      The patient is taking warfarin and comes in today for a routine follow up visit.  Positive risk factors for bleeding include an age of 72 years or older.  The bleeding index is 'intermediate risk'.  Positive CHADS2 values include History of HTN.  Negative CHADS2 values include Age > 72 years old.  Her last INR was 1.1 ratio.  Anticoagulation responsible provider: Dannielle Burn MD, Luvenia Heller.  INR POC: 1.6.    Anticoagulation Management Assessment/Plan:      The next INR is due 07/20/2009.  Anticoagulation instructions were given to patient.  Results were reviewed/authorized by Edrick Oh, RN.  She was notified by Edrick Oh RN.         Prior Anticoagulation Instructions: INR 1.1 Pt to take coumadin 26m tonight and tomorrow night then increase dose to 7.533mtill next INR check on 07/17/09.  Current Anticoagulation Instructions: INR 1.6 Take coumadin 108m 2 then resume 7.5mg32mce daily.  Recheck INR on 07/20/09 Continue lovenox

## 2010-03-12 NOTE — Assessment & Plan Note (Signed)
Summary: 3 month rov.sl   Visit Type:  Follow-up Referring Provider:  Linde Gillis Primary Provider:  Lennie Hummer  CC:  no complaints.  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 We have been following her for pulmonary hypertension.  Found to be hypoxemic in Dec 2010 started on O2. CT chest 12/10: No PE. Elevation of R hemidiaphragm. Areas of centrilobular emphysema. atelectasis in both bases. Large left lobe of thyroid with trachea deviation.  Echo 01/23/09: 55-60% mild LVH with pseudonormalizaiton. Mild to moderate MR. RV mild to moderately dilated. Mild RV dysfunction. Mild AS mean 61m HG.  RSVP 67. PFTs FEV1 1.23L (50%) FEV 1.81 (53%) DLCO 43% predicted.   Cath 1/11: RA 11, RV 84/6 with an EDP of 18, PA 83/28 with a mean of 49. PCWP 15  Fick cardiac output 7.0 L/min.  Cardiac index 3.0  PVR is 4.9 Woods units.  Started on Tyvaso (treprostinil) in March 2011. Using 4 times a day. Continues to do very well. Feels she can do what she wants. Has finished pulmonary rehab. Had 63MW recently with Dr. HKoleen Nimrod Results not available. Told to increase O2 to 3L at rest at 6L with exertion.  6MW (July 2011): 1952msats 92% rest (3L) ambulations sats to 83%.  Doing fine. Breathing continues to improve. Walks all round [employer] without problem. No problems with Tyvaso. No edema or dizziness.  Echo in 8/11. Normal LV. RV size and function totally normal (reviewed personally). No signifcant TR - unable to measure RV pressure. No recurrent AF.     Current Medications (verified): 1)  Chlorthalidone 25 Mg Tabs (Chlorthalidone) ...Marland Kitchen 1 Tab Once Daily 2)  Aspirin 81 Mg Tbec (Aspirin) .... Take One Tablet By Mouth Daily 3)  Fluoxetine Hcl 40 Mg Caps (Fluoxetine Hcl) ...Marland Kitchen 1 Cap Once Daily 4)  Omeprazole 20 Mg Tbec (Omeprazole) ...Marland Kitchen 1 Tab Once Daily 5)  Spironolactone  25 Mg Tabs (Spironolactone) .... Take One Tablet By Mouth Daily 6)  Alprazolam 1 Mg Tabs (Alprazolam) .... At Bedtime 7)  Digoxin 0.125 Mg Tabs (Digoxin) .... Take 1 Tablet By Mouth Every Other Day 8)  Warfarin Sodium 5 Mg Tabs (Warfarin Sodium) .... Use As Directed Per Anticoagulative Clinic 9)  Tyvaso 0.6 Mg/ml Soln (Treprostinil) .... 9 Breaths Four Times A Day Per Nebulizer 10)  Lisinopril 20 Mg Tabs (Lisinopril) .... Take 1 Tablet By Mouth Once A Day 11)  Diltiazem Hcl Er Beads 240 Mg Xr24h-Cap (Diltiazem Hcl Er Beads) .... Take One Capsule By Mouth Daily  Allergies (verified): 1)  ! * Dilaudid 2)  ! Pcn 3)  ! Prednisone 4)  ! * Gluten  Past History:  Past Medical History: Last updated: 04/03/2009 1. Hypertension, severe 2. Hyperlipidemia 3. PVCs 4. 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  Pulmoanry HTN      -- Right atrial pressure mean of 11, RV pressure 84/6 with an EDP of 18, PA pressure 83/28 with a mean of 49.  Pulmonary capillary wedge pressure 15.  Fick cardiac output 7.0 L/min.  Cardiac index 3.0 L/min/m2.  Pulmonary vascular resistance is 4.9 Woods units. 5. Diabetes 6. hx of breast cancer s/p L mastectomy 7. Celiac disease 8. Depression 9. COPD 10. Cor pulmonale 11. Morbid obesity 12. PUD  Review of Systems       As per HPI and past medical history; otherwise all systems  negative.   Vital Signs:  Patient profile:   70 year old female Height:      67 inches Weight:      250 pounds BMI:     39.30 Pulse rate:   67 / minute BP sitting:   128 / 70  (left arm) Cuff size:   large  Vitals Entered By: Mignon Pine, RMA (December 25, 2009 11:36 AM) CC: no complaints Comments 6 min walk performed, pt walked 1085f, oxygen was increased to 4L due to sats staying around 82-83% sats then came up to around 87% HKevan Rosebush RN  December 25, 2009 12:47 PM    Physical Exam  General:  looks good. Walks briskly with O2. no resp difficulty.  HEENT: normal. + nasal  cannula Neck: supple. JVP flat. Carotids 2+ bilat; no bruits. No lymphadenopathy or thryomegaly appreciated. Cor: PMI nondisplaced. Regular rate & rhythm. No rubs, gallops, 2/6 AS 2/6 TR  no RV heave + Left mastectomy Lungs: clear Abdomen: obese soft, nontender, nondistended. No hepatosplenomegaly. No bruits or masses. Good bowel sounds. Extremities: no cyanosis, clubbing, rash, edema Neuro: alert & orientedx3, cranial nerves grossly intact. moves all 4 extremities w/o difficulty. affect pleasant    New Orders:     1)  EKG w/ Interpretation (93000)  Due: 12/25/2009   Impression & Recommendations:  Problem # 1:  PULMONARY HYPERTENSION (ICD-416.8) Much improved by echo and symptomatically. Now NYHA II-III. Continue Tyvaso. Repeat 6MW today to assess functional capacity and assure adequate oxygenation. Repeat RHC in next few weeks to assess need for combination therapy.   Problem # 2:  ATRIAL FIBRILLATION (ICD-427.31) No recent re-occurence. Continue coumadin.   Other Orders: EKG w/ Interpretation (93000)  Patient Instructions: 1)  You are scheduled for a heart cath on Tue 11/29, please see instruction sheet given to you. 2)  Your physician recommends that you return for lab work in: week of 11/22 at your primary care MDs office, we have given you a prescription for this. 3)  Follow up in 4 months.

## 2010-03-12 NOTE — Letter (Signed)
Summary: [facility] - BP Readings   Imported By: Marilynne Drivers 06/21/2009 16:56:13  _____________________________________________________________________  External Attachment:    Type:   Image     Comment:   External Document

## 2010-03-12 NOTE — Progress Notes (Signed)
Summary: pt has med questions post hospital  Phone Note Call from Patient   Caller: Patient [phone] Reason for Call: Talk to Nurse Summary of Call: pt has questions re taking med and being a diabetic,also when can she resume coumadin? (today's her birthyday) Initial call taken by: Lorenda Hatchet,  January 09, 2010 9:07 AM  Follow-up for Phone Call        spoke w/pt and discussed w/Dr Bensimhon pt to hold coumadin until she sees Dr Helane Rima and she is to start metformin today and will f/u w/pcp regarding diabetes Kevan Rosebush, RN  January 09, 2010 2:08 PM

## 2010-03-12 NOTE — Miscellaneous (Signed)
Summary: Rehab Report/ CARDIAC REHAB DISCHARGE SUMMARY  Rehab Report/ CARDIAC REHAB DISCHARGE SUMMARY   Imported By: Bartholomew Boards 07/23/2009 16:31:57  _____________________________________________________________________  External Attachment:    Type:   Image     Comment:   External Document

## 2010-03-12 NOTE — Medication Information (Signed)
Summary: ccr-lr  Anticoagulant Therapy  Managed by: Edrick Oh, RN PCP: Lennie Hummer Supervising MD: Dannielle Burn MD, Luvenia Heller Indication 1: Atrial Fibrillation Lab Used: [facility] [place] Site: [place] INR POC 1.9  Dietary changes: no    Health status changes: no    Bleeding/hemorrhagic complications: no    Recent/future hospitalizations: no    Any changes in medication regimen? no    Recent/future dental: no  Any missed doses?: no       Is patient compliant with meds? yes       Allergies: 1)  ! * Dilaudid 2)  ! Pcn 3)  ! Prednisone 4)  ! * Gluten  Anticoagulation Management History:      Positive risk factors for bleeding include an age of 65 years or older.  The bleeding index is 'intermediate risk'.  Positive CHADS2 values include History of HTN.  Negative CHADS2 values include Age > 58 years old.  Her last INR was 1.1 ratio.  Anticoagulation responsible provider: Dannielle Burn MD, Luvenia Heller.  INR POC: 1.9.  Exp: 09/2010.    Anticoagulation Management Assessment/Plan:      The patient's current anticoagulation dose is Warfarin sodium 5 mg tabs: use as directed per Anticoagulative Clinic.  The target INR is 2.0-3.0.  The next INR is due 12/25/2009.  Anticoagulation instructions were given to patient.  Results were reviewed/authorized by Edrick Oh, RN.  She was notified by Edrick Oh RN.         Prior Anticoagulation Instructions: INR 3.2 Take 92m tonight then decrease dose to 7.556monce daily except 36m39mn T,Th,Sat  Current Anticoagulation Instructions: INR 1.9 Take coumadin 2 tablets tonight then resume 7.36mg52mce daily except 36mg 18mT,Th,Sat Will be starting Abx today for UTI

## 2010-03-12 NOTE — Progress Notes (Signed)
  Phone Note From Other Clinic   Caller: Dr.WIlliam Koleen Nimrod Details for Reason: Pt.Information Initial call taken by: Farley Ly    Faxed Cath over to [place]  March 09, 2009 8:49 AM

## 2010-03-12 NOTE — Medication Information (Signed)
Summary: ccr-lr  Anticoagulant Therapy  Managed by: Edrick Oh, RN PCP: Lennie Hummer Supervising MD: Dannielle Burn MD, Luvenia Heller Indication 1: Atrial Fibrillation Lab Used: [facility] [place] Site: [place] INR POC 3.2  Dietary changes: no    Health status changes: no    Bleeding/hemorrhagic complications: no    Recent/future hospitalizations: no    Any changes in medication regimen? no    Recent/future dental: no  Any missed doses?: no       Is patient compliant with meds? yes       Allergies: 1)  ! * Dilaudid 2)  ! Pcn 3)  ! Prednisone 4)  ! * Gluten  Anticoagulation Management History:      The patient is taking warfarin and comes in today for a routine follow up visit.  Positive risk factors for bleeding include an age of 37 years or older.  The bleeding index is 'intermediate risk'.  Positive CHADS2 values include History of HTN.  Negative CHADS2 values include Age > 38 years old.  Her last INR was 1.1 ratio.  Anticoagulation responsible Thamara Leger: Dannielle Burn MD, Luvenia Heller.  INR POC: 3.2.  Cuvette Lot#: [id number].  Exp: 09/2010.    Anticoagulation Management Assessment/Plan:      The patient's current anticoagulation dose is Warfarin sodium 5 mg tabs: use as directed per Anticoagulative Clinic.  The target INR is 2.0-3.0.  The next INR is due 11/23/2009.  Anticoagulation instructions were given to patient.  Results were reviewed/authorized by Edrick Oh, RN.  She was notified by Edrick Oh RN.         Prior Anticoagulation Instructions: INR 2.6 Continue coumadin 7.64m once daily except 511mon T,Sat  Current Anticoagulation Instructions: INR 3.2 Take 65m3monight then decrease dose to 7.65mg46mce daily except 65mg 42mT,Th,Sat

## 2010-03-12 NOTE — Miscellaneous (Signed)
Summary: Lovenox, Digoxin  Clinical Lists Changes  Medications: Added new medication of DIGOXIN 0.125 MG TABS (DIGOXIN) Take 1 tablet by mouth every other day - Signed Added new medication of LOVENOX 100 MG/ML SOLN (ENOXAPARIN SODIUM) Inject 135m subcutaneously as directed two times a day - Signed Rx of DIGOXIN 0.125 MG TABS (DIGOXIN) Take 1 tablet by mouth every other day;  #15 x 6;  Signed;  Entered by: JGurney Maxin RN, BSN;  Authorized by: GTerald Sleeper MD, FSwift [facility]  Method used: [phone], [address] [place]  [address] Ph: [phone] Fax: [phone] of LOVENOX 100 MG/ML SOLN (ENOXAPARIN SODIUM) Inject 1072msubcutaneously as directed two times a day;  #10 x 1;  Signed;  Entered by: JeGurney MaxinRN, BSN;  Authorized by: GuTerald SleeperMD, FAMethodist [facility] Method used: [facility] [phone] [address]  [address]Ph: [phone]: [phone]  Prescriptions: LOVENOX 100 MG/ML SOLN (ENOXAPARIN SODIUM) Inject 10089mubcutaneously as directed two times a day  #10 x 1   Entered by:   JenGurney MaxinN, BSN   Authorized by:   GuyTerald SleeperD, FACManati Medical Center Dr Alejandro Otero LopezSigned by:   JenGurney MaxinN, BSN on 07/11/2009   Method used:   [facility]        [phone]       [address]  [id number]    Ph: [phone]    Fax: [phone]:   [id number] 0.125 MG TABS (DIGOXIN) Take 1 tablet by mouth every other day  #15 x 6   Entered by:   JenGurney MaxinN, BSN   Authorized by:   GuyTerald SleeperD, FACIllinois Valley Community HospitalSigned by:   JenGurney MaxinN, BSN on 07/11/2009   Method used:   [facility]        [phone])       [address]  [address]    Ph: [phone]    Fax: [phone]:  :    [id number] Per Dr. De Lutricia Feilend prescriptions for Digoxin 0.125m51m mouth every other day and Lovenox 100mg96mcutaneously two times a [facility]. JenniGurney Maxin BSN  July 11, 2009 1:38 PM

## 2010-03-12 NOTE — Assessment & Plan Note (Signed)
Summary: 2 month rov.sl   Visit Type:  Follow-up Referring Provider:  Linde Gillis Primary Provider:  Lennie Hummer  CC:  Afib episode saw Dr Dannielle Burn.  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 We have been following her for pulmonary hypertension.  Found to be hypoxemic in Dec 2010 started on O2.  CT chest 12/10: No PE. Elevation of R hemidiaphragm. Areas of centrilobular emphysema. atelectasis in both bases. Large left lobe of thyroid with trachea deviation.  Echo 01/23/09: 55-60% mild LVH with pseudonormalizaiton. Mild to moderate MR. RV mild to moderately dilated. Mild RV dysfunction. Mild AS mean 40m HG.  RSVP 67  PFTs FEV1 1.23L (50%) FEV 1.81 (53%) DLCO 43% predicted.   Cath 1/11: RA 11, RV 84/6 with an EDP of 18, PA 83/28 with a mean of 49. PCWP 15  Fick cardiac output 7.0 L/min.  Cardiac index 3.0  PVR is 4.9 Woods units.  Started on Tyvaso (treprostinil) in March 2011. Using 4 times a day. Continues to do very well. Feels she can do what she wants. Has finished pulmonary rehab. Had 62MW recently with Dr. HKoleen Nimrod Results not available. Told to increase O2 to 3L at rest at 6L with exertion.  6MW (July 2011): 1956msats 92% rest (3L) ambulations sats to 83%  Last month had an episode of AF, lasted about 24 hours and resolved. Had recurrent epsiode a week or two later. None since. Tolerating coumadin well.  No syncope, presyncope.   New Orders:     1)  EKG w/ Interpretation (93000)     2)  Echocardiogram (Echo)   Current Medications (verified): 1)  Chlorthalidone 25 Mg Tabs (Chlorthalidone) ...Marland Kitchen 1 Tab Once Daily 2)  Aspirin 81 Mg Tbec (Aspirin) .... Take One Tablet By Mouth Daily 3)  Toprol Xl 100 Mg Xr24h-Tab (Metoprolol Succinate) ...Marland Kitchen 1 Tab Once Daily 4)  Fluoxetine Hcl 40 Mg Caps (Fluoxetine Hcl) ...Marland Kitchen 1 Cap Once Daily 5)  Actos 30 Mg  Tabs (Pioglitazone Hcl) ...Marland Kitchen 1 Tab Once Daily 6)  Omeprazole 20 Mg Tbec (Omeprazole) ...Marland Kitchen 1 Tab Once Daily 7)  Spironolactone 25 Mg Tabs (Spironolactone) .... Take One Tablet By Mouth Daily 8)  Alprazolam 1 Mg Tabs (Alprazolam) .... At Bedtime 9)  Digoxin 0.125 Mg Tabs (Digoxin) .... Take 1 Tablet By Mouth Every Other Day 10)  Warfarin Sodium 5 Mg Tabs (Warfarin Sodium) .... Use As Directed Per Anticoagulative Clinic 11)  Tyvaso 0.6 Mg/ml Soln (Treprostinil) .... 9 Breaths Four Times A Day Per Nebulizer 12)  Lisinopril 20 Mg Tabs (Lisinopril) .... Take 1 Tablet By Mouth Once A Day  Allergies (verified): 1)  ! * Dilaudid 2)  ! Pcn 3)  ! Prednisone 4)  ! * Gluten  Past History:  Past Medical History: Last updated: 04/03/2009 1. Hypertension, severe 2. Hyperlipidemia 3. PVCs 4. 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  Pulmoanry HTN      -- Right atrial pressure mean of 11, RV pressure 84/6 with an EDP of 18, PA pressure 83/28 with a mean of 49.  Pulmonary capillary wedge pressure 15.  Fick cardiac output 7.0 L/min.  Cardiac index 3.0 L/min/m2.  Pulmonary vascular resistance is 4.9 Woods units. 5. Diabetes 6. hx of breast cancer s/p L mastectomy 7. Celiac disease 8. Depression 9. COPD 10. Cor pulmonale 11. Morbid obesity 12. PUD  Review of Systems  As per HPI and past medical history; otherwise all systems negative.   Vital Signs:  Patient profile:   70 year old female Height:      67 inches Weight:      256 pounds BMI:     40.24 Pulse rate:   50 / minute BP sitting:   146 / 90  (left arm) Cuff size:   large  Vitals Entered By: Mignon Pine, RMA (September 12, 2009 10:26 AM)  Physical Exam  General:  looks good. Walks briskly with O2. no resp difficulty.  HEENT: normal. + nasal cannula Neck: supple. JVP flat. Carotids 2+ bilat; no bruits. No lymphadenopathy or thryomegaly appreciated. Cor: PMI nondisplaced. Regular rate & rhythm. No rubs, gallops, 2/6 AS 2/6 TR   no RV heave + Left mastectomy Lungs: clear Abdomen: obese soft, nontender, nondistended. No hepatosplenomegaly. No bruits or masses. Good bowel sounds. Extremities: no cyanosis, clubbing, rash, edema Neuro: alert & orientedx3, cranial nerves grossly intact. moves all 4 extremities w/o difficulty. affect pleasant    Impression & Recommendations:  Problem # 1:  PULMONARY HYPERTENSION (ICD-416.8) Symptomatically improved on Tyvaso but 6MW still poor. Will repeat echo to reassess pulmonary pressures. I thinkw eshould consider adding bosentan fo combination therapy however given lung disease will have to watch very closely for shunting and worsening hypoxemia. I have left a message for Dr. Koleen Nimrod to discuss.   Problem # 2:  ATRIAL FIBRILLATION (ICD-427.31) Quiescent for now. Given lung disease will change Toprol to cardizem 180. Continue coumadin.   Other Orders: EKG w/ Interpretation (93000) Echocardiogram (Echo)  Patient Instructions: 1)  Stop Toprol 2)  Start Diltiazem 130m daily 3)  Your physician has requested that you have an echocardiogram.  Echocardiography is a painless test that uses sound waves to create images of your heart. It provides your doctor with information about the size and shape of your heart and how well your heart's chambers and valves are working.  This procedure takes approximately one hour. There are no restrictions for this procedure. 4)  Follow up in 3 months. Prescriptions: DILT-CD 180 MG XR24H-CAP (DILTIAZEM HCL COATED BEADS) Take 1 tablet by mouth once a day  #30 x 6   Entered by:   HKevan Rosebush RN   Authorized by:   DJolaine Artist MD, FVal[facility]  Signed by:   HKevan Rosebush RN on 09/12/2009   Method used:   [facility]ron[facility] to        [facility] [phone] (retail)       [address] [place]  [address]      Ph: [phone]      Fax: [phone]  RxID:   [id number]

## 2010-03-12 NOTE — Progress Notes (Signed)
Summary: bp   Phone Note Call from Patient Call back at Home Phone [phone]   Caller: Patient Reason for Call: Talk to Nurse Summary of Call: request call back, wants to speak to nurse about bp, please call before 1:00pm Initial call taken by: Darnell Level,  Jun 21, 2009 9:37 AM  Follow-up for Phone Call        pt reports BP at pulm rehab has been running ok by manual checks, will let us know if becomes elevated Kevan Rosebush, RN  Jun 21, 2009 12:08 PM

## 2010-03-12 NOTE — Letter (Signed)
Summary: Cardiac Catheterization Instructions- [place] [place]  [address] [place]   [place], [place] [address]   Phone: [phone]  Fax: [phone]     12/25/2009 MRN: [id number]  [facility] [address] [place], [place]  [address]  Dear Ms. Otten,   You are scheduled for a Cardiac Catheterization on Tue 01/08/10 with Dr.Bensimhon  Please arrive to the 1st floor of the Heart and Vascular Center at [facility] at 12:30pm on the day of your procedure. Please do not arrive before 6:30 a.m. Call the Heart and Vascular Center at [phone] if you are unable to make your appointmnet. The Code to get into the parking garage under the building is 2000. Take the elevators to the 1st floor. You must have someone to drive you home. Someone must be with you for the first 24 hours after you arrive home. Please wear clothes that are easy to get on and off and wear slip-on shoes. Do not eat or drink after midnight except water with your medications that morning. Bring all your medications and current insurance cards with you.  ___ DO NOT take these medications before your procedure: ________________________________________________________________  ___ Make sure you take your aspirin.  ___ You may take ALL of your medications with water that morning. ________________________________________________________________________________________________________________________________  ___ DO NOT take ANY medications before your procedure.  ___ Pre-med instructions:  ________________________________________________________________________________________________________________________________  The usual length of stay after your procedure is 2 to 3 hours. This can vary.  If you have any questions, please call the office at the number listed above.   Kevan Rosebush, RN

## 2010-03-12 NOTE — Progress Notes (Signed)
Summary: thyroid biopsy  Phone Note Other Incoming   Caller: Dr Henderson's office Summary of Call: received called this am from Dr Henderson's office, they were going to sch pt for thyroid biopsy but she wants it done in [place] so they would like for [name] to handle it.  Per Dr Haroldine Laws need to sch ultrasound guided fine needle aspiration of thyroid, have called pt and Left message to call back  Initial call taken by: Kevan Rosebush, RN,  March 09, 2009 5:46 PM  Follow-up for Phone Call        pt is aware we will sch thryoid u/s, she is available about anytime Kevan Rosebush, RN  March 13, 2009 4:52 PM

## 2010-03-12 NOTE — Assessment & Plan Note (Signed)
Summary: Wanda [name]   Referring Provider:  Linde Gillis Primary Provider:  Lennie Hummer  CC:  no complaints.  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 we have been following her for pulmonary hypertension.   Had cardiac cath March 2006 for palpitations. Minimimal non-obstructive CAD. Blood pressure was severely elevated 212/107. LVEDP 30.  Found to be hypoxemic in Dec 2010 started on O2.  CT chest 12/10: No PE. Elevation of R hemidiaphragm. Areas of centrilobular emphysema. atelectasis in both bases. Large left lobe of thyroid with trachea deviation.  Echo 01/23/09: 55-60% mild LVH with pseudonormalizaiton. Mild to moderate MR. RV mild to moderately dilated. Mild RV dysfunction. Mild AS mean 32m HG.  RSVP 67  PFTs FEV1 1.23L (50%) FEV 1.81 (53%) DLCO 43% predicted.   Cath 1/11: RA 11, RV 84/6 with an EDP of 18, PA 83/28 with a mean of 49. PCWP 15  Fick cardiac output 7.0 L/min.  Cardiac index 3.0  PVR is 4.9 Woods units.  Doing much better. Able to do most activities if she goes slowly. Continues to go to pulmonary rehab and sats stay in 92-93% on 3L. Edema resolved. No syncope or presyncope. Much more energy. Has sleep study and was told she didn't need CPAP. No has much more energy. Dr. HKoleen Nimrodconsidering starting Tyvaso.  Had thryoid biopsy which was benign. 148y/[facility]  Current Medications (verified): 1)  Chlorthalidone 25 Mg Tabs (Chlorthalidone) ..Marland Kitchen. 1 Tab Once Daily 2)  Aspirin 81 Mg Tbec (Aspirin) .... Take One Tablet By Mouth Daily 3)  Lisinopril 40 Mg Tabs (Lisinopril) ..Marland Kitchen. 1 Tab Once Daily 4)  Toprol Xl 100 Mg Xr24h-Tab (Metoprolol Succinate) ..Marland Kitchen. 1 Tab Once Daily 5)  Diovan 320 Mg Tabs (Valsartan) ..Marland Kitchen. 1 Tab Once Daily 6)  Fluoxetine Hcl 40 Mg Caps (Fluoxetine Hcl) ..Marland Kitchen. 1 Cap Once Daily 7)  Actos 30 Mg Tabs  (Pioglitazone Hcl) ..Marland Kitchen. 1 Tab Once Daily 8)  Omeprazole 20 Mg Tbec (Omeprazole) ..Marland Kitchen. 1 Tab Two Times A Day 9)  Spiriva Handihaler 18 Mcg Caps (Tiotropium Bromide Monohydrate) .... Use As Directed At Bedtime 10)  Ventolin Hfa 108 (90 Base) Mcg/act Aers (Albuterol Sulfate) .... Use As Directed Three Times A Day 11)  Spironolactone 25 Mg Tabs (Spironolactone) .... Take One Tablet By Mouth Daily 12)  Alprazolam 1 Mg Tabs (Alprazolam) .... At Bedtime  Allergies (verified): 1)  ! * Dilaudid 2)  ! Pcn 3)  ! Prednisone 4)  ! * Gluten  Past History:  Past Medical History: 1. Hypertension, severe 2. Hyperlipidemia 3. PVCs 4. 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  Pulmoanry HTN      -- Right atrial pressure mean of 11, RV pressure 84/6 with an EDP of 18, PA pressure 83/28 with a mean of 49.  Pulmonary capillary wedge pressure 15.  Fick cardiac output 7.0 L/min.  Cardiac index 3.0 L/min/m2.  Pulmonary vascular resistance is 4.9 Woods units. 5. Diabetes 6. hx of breast cancer s/p L mastectomy 7. Celiac disease 8. Depression 9. COPD 10. Cor pulmonale 11. Morbid obesity 12. PUD  Review of Systems       As per HPI and past medical history; otherwise all systems negative.   Vital Signs:  Patient profile:   70year old female Height:      69 inches Weight:      254  pounds BMI:     37.64 Pulse rate:   60 / minute BP sitting:   128 / 82  (left arm) Cuff size:   large  Vitals Entered By: Mignon Pine, RMA (April 03, 2009 11:25 AM)  Physical Exam  General:  Elderly. Walks briskly with O2. no resp difficulty.  HEENT: normal. + nasal cannula Neck: supple. JVP 6. Carotids 2+ bilat; no bruits. No lymphadenopathy or thryomegaly appreciated. Cor: PMI nondisplaced. Regular rate & rhythm. No rubs, gallops, 2/6 AS 2/6 TR  no RV heave + Left mastectomy Lungs: clear Abdomen: obese soft, nontender, nondistended. No hepatosplenomegaly. No bruits or masses. Good bowel sounds. Extremities: no  cyanosis, clubbing, rash, edema Neuro: alert & orientedx3, cranial nerves grossly intact. moves all 4 extremities w/o difficulty. affect pleasant    Impression & Recommendations:  Problem # 1:  PULMONARY HYPERTENSION (ICD-416.8) She is much improved with adequate oxygenation and pulmonary rehab. I am very happy with her progress. I suspect most of her PAH is due to intrinsic lung disease and chronic left sided diastolic dysfunction, however given that her PA pressures are elevated out of proportion to her lung disease, I think it is reasonable to try inhaled prostacyclins to she if she improves. Need to watch closely for any decompensation dure to increased shunting. Will repeat echo to see if pulmonary pressures improving in face of improved oxygenation.  Problem # 2:  HYPERTENSION, UNSPECIFIED (ICD-401.9) Blood pressure well controlled. Continue current regimen. Check electrolytes on spironolactone.  Problem # 3:  EDEMA (ICD-782.3) Resolved.   Other Orders: TLB-BMP (Basic Metabolic Panel-BMET) (25366-YQIHKVQ) TLB-Hepatic/Liver Function Pnl (80076-HEPATIC) TLB-BNP (B-Natriuretic Peptide) (83880-BNPR) Echocardiogram (Echo)  Patient Instructions: 1)  Labs today 2)  Your physician has requested that you have an echocardiogram.  Echocardiography is a painless test that uses sound waves to create images of your heart. It provides your doctor with information about the size and shape of your heart and how well your heart's chambers and valves are working.  This procedure takes approximately one hour. There are no restrictions for this procedure. 3)  Follow up in 3 months

## 2010-03-12 NOTE — Cardiovascular Report (Signed)
Summary: CDC Pre-Cath Orders  CDC Pre-Cath Orders   Imported By: Jamelle Haring 02/28/2009 09:36:30  _____________________________________________________________________  External Attachment:    Type:   Image     Comment:   External Document

## 2010-03-12 NOTE — Letter (Signed)
Summary: [facility]   Imported By: Marilynne Drivers 08/28/2009 17:59:39  _____________________________________________________________________  External Attachment:    Type:   Image     Comment:   External Document

## 2010-03-12 NOTE — Letter (Signed)
Summary: [facility] Cardiac Progress Note  [facility] Cardiac Progress Note   Imported By: Sallee Provencal 04/12/2009 11:19:16  _____________________________________________________________________  External Attachment:    Type:   Image     Comment:   External Document

## 2010-03-12 NOTE — Medication Information (Signed)
Summary: rov/sp  Anticoagulant Therapy  Managed by: Edrick Oh, RN PCP: Lennie Hummer Supervising MD: Domenic Polite MD, Mikeal Hawthorne Indication 1: Atrial Fibrillation Lab Used: [facility] [place] Site: [place] INR POC 2.3  Dietary changes: no    Health status changes: no    Bleeding/hemorrhagic complications: no    Recent/future hospitalizations: no    Any changes in medication regimen? no    Recent/future dental: no  Any missed doses?: no       Is patient compliant with meds? yes       Allergies: 1)  ! * Dilaudid 2)  ! Pcn 3)  ! Prednisone 4)  ! * Gluten  Anticoagulation Management History:      The patient is taking warfarin and comes in today for a routine follow up visit.  Positive risk factors for bleeding include an age of 72 years or older.  The bleeding index is 'intermediate risk'.  Positive CHADS2 values include History of HTN.  Negative CHADS2 values include Age > 61 years old.  Her last INR was 1.1 ratio.  Anticoagulation responsible provider: Domenic Polite MD, Mikeal Hawthorne.  INR POC: 2.3.  Cuvette Lot#: [id number].  Exp: 09/2010.    Anticoagulation Management Assessment/Plan:      The target INR is 2.0-3.0.  The next INR is due 09/07/2009.  Anticoagulation instructions were given to patient.  Results were reviewed/authorized by Edrick Oh, RN.  She was notified by Edrick Oh RN.         Prior Anticoagulation Instructions: INR 2.8  Continue same dose of 1 1/2 tablets every day.   Current Anticoagulation Instructions: INR 2.3 Continue coumadin 7.59m once daily

## 2010-03-14 NOTE — Progress Notes (Signed)
Summary: from office not in nov - clear for surgery in jan 13  Phone Note From Other Clinic   Caller: heather office [phone]. fax [phone] Details of Complaint: from office notes in nov. can pt be clear for surgery . [facility]  Initial call taken by: Neil Crouch,  February 12, 2010 10:17 AM  Follow-up for Phone Call        Pt is scheduled for a Hysteroscopy D& C and Endometrial ablation on 1/13/`12.  Nira Conn is aware Dr. Haroldine Laws is out of the office until Thursday and will await surgical clearance letter. Horton Chin RN     Appended Document: from office not in nov - clear for surgery in jan 13 ok to proceed.  Appended Document: from office not in nov - clear for surgery in jan 13 Heather aware and note faxed

## 2010-03-14 NOTE — Progress Notes (Signed)
Summary: Coumadin Question  Phone Note From Other Clinic   Summary of Call: Received a call from nurse in Dr. Christen Butter office asking when pt should restart coumadin following endometrial ablation. Notified nurse that the physician who performed procedure requiring Coumadin to be held should make the determination as to when to restart the Coumadin as they know how much the pt bled during the procedure or could bleed following the procedure. Notified nurse that pt should have CCR visit 3-4 days after resuming coumadin. Nurse verbalized understanding. Initial call taken by: Gurney Maxin, RN, BSN,  February 22, 2010 5:05 PM

## 2010-03-14 NOTE — Medication Information (Signed)
Summary: rov/tm  Anticoagulant Therapy  Managed by: Edrick Oh, RN PCP: Lennie Hummer Supervising MD: Domenic Polite MD, Mikeal Hawthorne Indication 1: Atrial Fibrillation Lab Used: [facility] [place] Site: [place] INR POC 1.6  Dietary changes: no    Health status changes: yes       Details: Had uterine bleeding  Bleeding/hemorrhagic complications: no    Recent/future hospitalizations: yes       Details: Saw Dr Cynda Familia   Had D & C and ablation for endometriosis  Any changes in medication regimen? no    Recent/future dental: no  Any missed doses?: yes     Details: Was off coumadin for above procedure but restarted last week at regular dose  Is patient compliant with meds? yes       Allergies: 1)  ! * Dilaudid 2)  ! Pcn 3)  ! Prednisone 4)  ! * Gluten  Anticoagulation Management History:      The patient is taking warfarin and comes in today for a routine follow up visit.  Positive risk factors for bleeding include an age of 72 years or older.  The bleeding index is 'intermediate risk'.  Positive CHADS2 values include History of HTN.  Negative CHADS2 values include Age > 72 years old.  Her last INR was 1.1 ratio.  Anticoagulation responsible provider: Domenic Polite MD, Mikeal Hawthorne.  INR POC: 1.6.  Cuvette Lot#: [id number].  Exp: 09/2010.    Anticoagulation Management Assessment/Plan:      The patient's current anticoagulation dose is Warfarin sodium 5 mg tabs: use as directed per Anticoagulative Clinic.  The target INR is 2.0-3.0.  The next INR is due 03/19/2010.  Anticoagulation instructions were given to patient.  Results were reviewed/authorized by Edrick Oh, RN.  She was notified by Edrick Oh RN.         Prior Anticoagulation Instructions: INR 2.1 Continue coumadin 7.78m once daily except 562mon T,Th,Sat Cath scheduled for 01/08/10 Recheck INR 01/18/10  Current Anticoagulation Instructions: INR 1.6 Restarted coumadin last week Take 1019monight then resume 7.5mg47mce daily except 5mg 36m T,Th,Sat

## 2010-03-19 ENCOUNTER — Encounter: Payer: Self-pay | Admitting: Cardiology

## 2010-03-19 ENCOUNTER — Encounter (INDEPENDENT_AMBULATORY_CARE_PROVIDER_SITE_OTHER): Payer: Medicare Other

## 2010-03-19 DIAGNOSIS — Z7901 Long term (current) use of anticoagulants: Secondary | ICD-10-CM

## 2010-03-19 DIAGNOSIS — I4891 Unspecified atrial fibrillation: Secondary | ICD-10-CM

## 2010-03-20 NOTE — Letter (Signed)
Summary: [facility] Office Visit Note   [facility] Office Visit Note   Imported By: Sallee Provencal 03/12/2010 15:58:30  _____________________________________________________________________  External Attachment:    Type:   Image     Comment:   External Document

## 2010-03-28 NOTE — Medication Information (Signed)
Summary: ccr-lr  Lab Visit  Orders Today:  Anticoagulant Therapy  Managed by: Edrick Oh, RN PCP: Lennie Hummer Supervising MD: Dannielle Burn MD, Luvenia Heller Indication 1: Atrial Fibrillation Lab Used: [facility] [place] Site: [place] INR POC 3.0  Dietary changes: no    Health status changes: no    Bleeding/hemorrhagic complications: no    Recent/future hospitalizations: no    Any changes in medication regimen? no    Recent/future dental: no  Any missed doses?: no       Is patient compliant with meds? yes         Anticoagulation Management History:      The patient is taking warfarin and comes in today for a routine follow up visit.  Positive risk factors for bleeding include an age of 72 years or older.  The bleeding index is 'intermediate risk'.  Positive CHADS2 values include History of HTN.  Negative CHADS2 values include Age > 92 years old.  Her last INR was 1.1 ratio.  Anticoagulation responsible provider: Dannielle Burn MD, Luvenia Heller.  INR POC: 3.0.  Cuvette Lot#: [id number].  Exp: 09/2010.    Anticoagulation Management Assessment/Plan:      The patient's current anticoagulation dose is Warfarin sodium 5 mg tabs: use as directed per Anticoagulative Clinic.  The target INR is 2.0-3.0.  The next INR is due 04/02/2010.  Anticoagulation instructions were given to patient.  Results were reviewed/authorized by Edrick Oh, RN.  She was notified by Edrick Oh RN.         Prior Anticoagulation Instructions: INR 1.6 Restarted coumadin last week Take 64m tonight then resume 7.5565monce daily except 65m9mn T,Th,Sat  Current Anticoagulation Instructions: INR 3.0 Continue coumadin 7.65mg3mce daily except 65mg 36mTuesdays, Thursdays and Saturdays

## 2010-04-02 ENCOUNTER — Encounter: Payer: Self-pay | Admitting: Cardiology

## 2010-04-02 ENCOUNTER — Encounter (INDEPENDENT_AMBULATORY_CARE_PROVIDER_SITE_OTHER): Payer: Medicare Other

## 2010-04-02 DIAGNOSIS — Z7901 Long term (current) use of anticoagulants: Secondary | ICD-10-CM

## 2010-04-02 DIAGNOSIS — I4891 Unspecified atrial fibrillation: Secondary | ICD-10-CM

## 2010-04-02 LAB — CONVERTED CEMR LAB: POC INR: 2.5

## 2010-04-08 ENCOUNTER — Telehealth (INDEPENDENT_AMBULATORY_CARE_PROVIDER_SITE_OTHER): Payer: Self-pay | Admitting: *Deleted

## 2010-04-09 ENCOUNTER — Encounter: Payer: Self-pay | Admitting: Cardiology

## 2010-04-09 ENCOUNTER — Encounter (INDEPENDENT_AMBULATORY_CARE_PROVIDER_SITE_OTHER): Payer: Medicare Other

## 2010-04-09 DIAGNOSIS — I4891 Unspecified atrial fibrillation: Secondary | ICD-10-CM

## 2010-04-09 DIAGNOSIS — Z7901 Long term (current) use of anticoagulants: Secondary | ICD-10-CM

## 2010-04-09 NOTE — Medication Information (Signed)
Summary: ccr-lr  Anticoagulant Therapy  Managed by: Edrick Oh, RN PCP: Lennie Hummer Supervising MD: Dannielle Burn MD, Luvenia Heller Indication 1: Atrial Fibrillation Lab Used: [facility] [place] Site: [place] INR POC 2.5  Dietary changes: no    Health status changes: yes       Details: Has UTI  Bleeding/hemorrhagic complications: no    Recent/future hospitalizations: no    Any changes in medication regimen? yes       Details: Started on Bactrim DS 03/31/10  Will finish 04/07/10  Recent/future dental: no  Any missed doses?: yes     Details: Dr Woody Seller to pt to hold coumadin last 2 nights due to Abx  Is patient compliant with meds? yes       Allergies: 1)  ! * Dilaudid 2)  ! Pcn 3)  ! Prednisone 4)  ! * Gluten  Anticoagulation Management History:      The patient is taking warfarin and comes in today for a routine follow up visit.  Positive risk factors for bleeding include an age of 7 years or older.  The bleeding index is 'intermediate risk'.  Positive CHADS2 values include History of HTN.  Negative CHADS2 values include Age > 63 years old.  Her last INR was 1.1 ratio.  Anticoagulation responsible provider: Dannielle Burn MD, Luvenia Heller.  INR POC: 2.5.  Cuvette Lot#: [id number].  Exp: 09/2010.    Anticoagulation Management Assessment/Plan:      The patient's current anticoagulation dose is Warfarin sodium 5 mg tabs: use as directed per Anticoagulative Clinic.  The target INR is 2.0-3.0.  The next INR is due 04/09/2010.  Anticoagulation instructions were given to patient.  Results were reviewed/authorized by Edrick Oh, RN.  She was notified by Edrick Oh RN.         Prior Anticoagulation Instructions: INR 3.0 Continue coumadin 7.40m once daily except 560mon Tuesdays, Thursdays and Saturdays  Current Anticoagulation Instructions: INR 2.5 Taking Bactrim DS Take coumadin 52m452mnce daily until finished with Bactrim then resume 7.52mg83mce daily except 52mg 852mT,Th,Sat

## 2010-04-10 ENCOUNTER — Encounter: Payer: Self-pay | Admitting: Physician Assistant

## 2010-04-10 ENCOUNTER — Ambulatory Visit (INDEPENDENT_AMBULATORY_CARE_PROVIDER_SITE_OTHER): Payer: Medicare Other | Admitting: Cardiology

## 2010-04-10 DIAGNOSIS — I2510 Atherosclerotic heart disease of native coronary artery without angina pectoris: Secondary | ICD-10-CM

## 2010-04-10 DIAGNOSIS — I4891 Unspecified atrial fibrillation: Secondary | ICD-10-CM

## 2010-04-16 ENCOUNTER — Encounter (INDEPENDENT_AMBULATORY_CARE_PROVIDER_SITE_OTHER): Payer: Medicare Other

## 2010-04-16 ENCOUNTER — Encounter: Payer: Self-pay | Admitting: Cardiology

## 2010-04-16 DIAGNOSIS — I4891 Unspecified atrial fibrillation: Secondary | ICD-10-CM

## 2010-04-16 DIAGNOSIS — Z7901 Long term (current) use of anticoagulants: Secondary | ICD-10-CM

## 2010-04-16 LAB — CONVERTED CEMR LAB: POC INR: 2

## 2010-04-18 NOTE — Progress Notes (Signed)
Summary: Antibiotic  Phone Note Call from Patient   Caller: Patient Reason for Call: Talk to Nurse Summary of Call: patient states she has been put on antibiotic and wants to know if she has to change her Coumadin/tg Initial call taken by: Alphonsus Sias [facility],  April 08, 2010 2:25 PM  Follow-up for Phone Call        Pt will be starting cipro 567m two times a day today x 7 days.  She has appt for INR check tomorrow and I will adjust coumadin then based on her INR results.  Pt aware. Follow-up by: LEdrick OhRN,  April 08, 2010 2:47 PM

## 2010-04-18 NOTE — Assessment & Plan Note (Addendum)
Summary: 6 month fu recv reminder vs   Visit Type:  Follow-up Primary Provider:  Lennie Hummer   History of Present Illness: patient presents for scheduled six-month followup with Dr. Dannielle Burn, for ongoing monitoring and management of atrial fibrillation. She is followed by Dr. Haroldine Laws, in our [place] office, one pulmonary hypertension, and is due to see him next month, after having undergone relatively recent reevaluation with a right heart catheterization.  Clinically, she is doing extremely well. She has lost nearly 20 pounds, since her last OV with [name] in July. She is on continuous oxygen and  Tyvaso, for her pulmonary hypertension. She feels that the latter medication has helped improve her blood pressure control.  She is currently on a second trial of antibiotic for a UTI.  Preventive Screening-Counseling & Management  Alcohol-Tobacco     Smoking Status: quit  Comments: quit smoking about 15 yrs ago.  Current Medications (verified): 1)  Chlorthalidone 25 Mg Tabs (Chlorthalidone) .Marland Kitchen.. 1 Tab Once Daily 2)  Aspirin 81 Mg Tbec (Aspirin) .... Take One Tablet By Mouth Daily 3)  Fluoxetine Hcl 40 Mg Caps (Fluoxetine Hcl) .Marland Kitchen.. 1 Cap Once Daily 4)  Omeprazole 20 Mg Tbec (Omeprazole) .Marland Kitchen.. 1 Tab Once Daily 5)  Spironolactone 25 Mg Tabs (Spironolactone) .... Take One Tablet By Mouth Daily 6)  Alprazolam 1 Mg Tabs (Alprazolam) .... At Bedtime 7)  Digoxin 0.125 Mg Tabs (Digoxin) .... Take 1 Tablet By Mouth Every Other Day 8)  Warfarin Sodium 5 Mg Tabs (Warfarin Sodium) .... Use As Directed Per Anticoagulative Clinic 9)  Tyvaso 0.6 Mg/ml Soln (Treprostinil) .... 9 Breaths Four Times A Day Per Nebulizer 10)  Lisinopril 20 Mg Tabs (Lisinopril) .... Take 1 Tablet By Mouth Once A Day 11)  Diltiazem Hcl Er Beads 240 Mg Xr24h-Cap (Diltiazem Hcl Er Beads) .... Take One Capsule By Mouth Daily  Allergies: 1)  ! * Dilaudid 2)  ! Pcn 3)  ! Prednisone 4)  ! * Gluten  Comments:  Nurse/Medical  Assistant: The patient's medications were reviewed with the patient and were updated in the Medication List. Pt verbalized understanding.  Gurney Maxin, RN, BSN (April 10, 2010 11:03 AM)  Past History:  Past Medical History: Last updated: 04/03/2009 1. Hypertension, severe 2. Hyperlipidemia 3. PVCs 4. 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  Pulmoanry HTN      -- Right atrial pressure mean of 11, RV pressure 84/6 with an EDP of 18, PA pressure 83/28 with a mean of 49.  Pulmonary capillary wedge pressure 15.  Fick cardiac output 7.0 L/min.  Cardiac index 3.0 L/min/m2.  Pulmonary vascular resistance is 4.9 Woods units. 5. Diabetes 6. hx of breast cancer s/p L mastectomy 7. Celiac disease 8. Depression 9. COPD 10. Cor pulmonale 11. Morbid obesity 12. PUD  Review of Systems       No fevers, chills, hemoptysis, dysphagia, melena, hematocheezia, hematuria, rash, claudication, orthopnea, pnd, pedal edema. All other systems negative.   Vital Signs:  Patient profile:   70 year old female Height:      67 inches Weight:      238 pounds Pulse rate:   64 / minute BP sitting:   123 / 80  (left arm) Cuff size:   large  Vitals Entered By: Gurney Maxin, RN, BSN (April 10, 2010 10:57 AM) Comments Follow up office visit.    Physical Exam  Additional Exam:  GEN: 70 year old female, obese, sitting upright, no distress HEENT: NCAT,PERRLA,EOMI; nasal cannula in place NECK: palpable  pulses, no bruits; no JVD; no TM LUNGS: CTA bilaterally HEART: RRR (S1S2); no significant murmurs; no rubs; no gallops ABD: soft, NT; intact BS EXT: intact distal pulses; no edema SKIN: warm, dry MUSC: no obvious deformity NEURO: A/O (x3)     EKG  Procedure date:  04/10/2010  Findings:      first-degree AV block at 65 bpm; normal axis; no ischemic changes; question prior ASMI  Impression & Recommendations:  Problem # 1:  ATRIAL FIBRILLATION (ICD-427.31)  maintaining normal sinus rhythm.  On chronic Coumadin, followed here in our [place] clinic.  Problem # 2:  PULMONARY HYPERTENSION (ICD-416.8)  stable on current treatment regimen, followed by Dr. Haroldine Laws in our [place] office.  Problem # 3:  HYPERTENSION, UNSPECIFIED (ICD-401.9)  markedly improved, on current medication regimen. Lisinopril added, at time of last OV.  Problem # 4:  MORBID OBESITY (ICD-278.01)  patient has lost 17 pounds, by improved diet and increased exercise tolerance.  Other Orders: EKG w/ Interpretation (93000)  Patient Instructions: 1)  Your physician recommends that you continue on your current medications as directed. Please refer to the Current Medication list given to you today. 2)  Follow up in  6 months

## 2010-04-18 NOTE — Medication Information (Signed)
Summary: ccr-lr  Anticoagulant Therapy  Managed by: Edrick Oh, RN PCP: Lennie Hummer Supervising MD: Domenic Polite MD, Mikeal Hawthorne Indication 1: Atrial Fibrillation Lab Used: [facility] [place] Site: [place] INR POC 3.3  Dietary changes: no    Health status changes: yes       Details: Has UTI  Bleeding/hemorrhagic complications: no    Recent/future hospitalizations: no    Any changes in medication regimen? yes       Details: Started on cipro 560m bid x 7 days today  Recent/future dental: no  Any missed doses?: no       Is patient compliant with meds? yes       Allergies: 1)  ! * Dilaudid 2)  ! Pcn 3)  ! Prednisone 4)  ! * Gluten  Anticoagulation Management History:      The patient is taking warfarin and comes in today for a routine follow up visit.  Positive risk factors for bleeding include an age of 89years or older.  The bleeding index is 'intermediate risk'.  Positive CHADS2 values include History of HTN.  Negative CHADS2 values include Age > 740years old.  Her last INR was 1.1 ratio.  Anticoagulation responsible provider: MDomenic PoliteMD, SMikeal Hawthorne  INR POC: 3.3.  Cuvette Lot#: [id number]  Exp: 09/2010.    Anticoagulation Management Assessment/Plan:      The patient's current anticoagulation dose is Warfarin sodium 5 mg tabs: use as directed per Anticoagulative Clinic.  The target INR is 2.0-3.0.  The next INR is due 04/16/2010.  Anticoagulation instructions were given to patient.  Results were reviewed/authorized by LEdrick Oh RN.  She was notified by LEdrick OhRN.         Prior Anticoagulation Instructions: INR 2.5 Taking Bactrim DS Take coumadin 532monce daily until finished with Bactrim then resume 7.63m39mnce daily except 63mg37m T,Th,Sat  Current Anticoagulation Instructions: INR 3.3 Hold coumadin tonight then continue coumadin 63mg 22me daily until finished with Cipro.  Recheck INR on 04/16/10

## 2010-04-19 ENCOUNTER — Encounter: Payer: Self-pay | Admitting: Internal Medicine

## 2010-04-19 ENCOUNTER — Ambulatory Visit (INDEPENDENT_AMBULATORY_CARE_PROVIDER_SITE_OTHER): Payer: Medicare Other | Admitting: Internal Medicine

## 2010-04-19 DIAGNOSIS — I2789 Other specified pulmonary heart diseases: Secondary | ICD-10-CM

## 2010-04-19 DIAGNOSIS — I4891 Unspecified atrial fibrillation: Secondary | ICD-10-CM

## 2010-04-23 LAB — POCT I-STAT 3, ART BLOOD GAS (G3+)
Acid-base deficit: 5 mmol/L — ABNORMAL HIGH (ref 0.0–2.0)
Bicarbonate: 22.4 mEq/L (ref 20.0–24.0)
O2 Saturation: 93 %
pO2, Arterial: 77 mmHg — ABNORMAL LOW (ref 80.0–100.0)

## 2010-04-23 LAB — POCT I-STAT, CHEM 8
Calcium, Ion: 1.17 mmol/L (ref 1.12–1.32)
Chloride: 96 mEq/L (ref 96–112)
HCT: 35 % — ABNORMAL LOW (ref 36.0–46.0)
Potassium: 4.3 mEq/L (ref 3.5–5.1)
Sodium: 141 mEq/L (ref 135–145)

## 2010-04-23 LAB — POCT I-STAT 3, VENOUS BLOOD GAS (G3P V)
Acid-Base Excess: 3 mmol/L — ABNORMAL HIGH (ref 0.0–2.0)
O2 Saturation: 53 %
pH, Ven: 7.363 — ABNORMAL HIGH (ref 7.250–7.300)

## 2010-04-23 NOTE — Medication Information (Signed)
Summary: ccr-lr  Anticoagulant Therapy  Managed by: Edrick Oh, RN PCP: Lennie Hummer Supervising MD: Dannielle Burn MD, Luvenia Heller Indication 1: Atrial Fibrillation Lab Used: [facility] [place] Site: [place] INR POC 2.0  Dietary changes: no    Health status changes: no    Bleeding/hemorrhagic complications: no    Recent/future hospitalizations: no    Any changes in medication regimen? no    Recent/future dental: no  Any missed doses?: no       Is patient compliant with meds? yes       Allergies: 1)  ! * Dilaudid 2)  ! Pcn 3)  ! Prednisone 4)  ! * Gluten  Anticoagulation Management History:      The patient is taking warfarin and comes in today for a routine follow up visit.  Positive risk factors for bleeding include an age of 72 years or older.  The bleeding index is 'intermediate risk'.  Positive CHADS2 values include History of HTN.  Negative CHADS2 values include Age > 10 years old.  Her last INR was 1.1 ratio.  Anticoagulation responsible provider: Dannielle Burn MD, Luvenia Heller.  INR POC: 2.0.  Cuvette Lot#: [id number].  Exp: 09/2010.    Anticoagulation Management Assessment/Plan:      The patient's current anticoagulation dose is Warfarin sodium 5 mg tabs: use as directed per Anticoagulative Clinic.  The target INR is 2.0-3.0.  The next INR is due 05/03/2010.  Anticoagulation instructions were given to patient.  Results were reviewed/authorized by Edrick Oh, RN.  She was notified by Edrick Oh RN.         Prior Anticoagulation Instructions: INR 3.3 Hold coumadin tonight then continue coumadin 34m once daily until finished with Cipro.  Recheck INR on 04/16/10  Current Anticoagulation Instructions: INR 2.0 Finished cipro today Resume coumadin 7.549monce daily except 50m24mn T,Th,Sat

## 2010-04-28 LAB — POCT I-STAT 3, VENOUS BLOOD GAS (G3P V)
Acid-Base Excess: 6 mmol/L — ABNORMAL HIGH (ref 0.0–2.0)
Acid-Base Excess: 6 mmol/L — ABNORMAL HIGH (ref 0.0–2.0)
Bicarbonate: 32.7 mEq/L — ABNORMAL HIGH (ref 20.0–24.0)
TCO2: 36 mmol/L (ref 0–100)
pCO2, Ven: 55.1 mmHg — ABNORMAL HIGH (ref 45.0–50.0)
pCO2, Ven: 57.5 mmHg — ABNORMAL HIGH (ref 45.0–50.0)
pH, Ven: 7.368 — ABNORMAL HIGH (ref 7.250–7.300)
pH, Ven: 7.404 — ABNORMAL HIGH (ref 7.250–7.300)
pO2, Ven: 34 mmHg (ref 30.0–45.0)
pO2, Ven: 36 mmHg (ref 30.0–45.0)

## 2010-04-30 NOTE — Assessment & Plan Note (Signed)
Summary: 4 MONTH/D.MILLER/BH   Visit Type:  Follow-up Referring Provider:  Linde Gillis Primary Provider:  Lennie Hummer  CC:  pt has no complaints today.  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  We have been following her for pulmonary hypertension.  Found to be hypoxemic in Dec 2010 started on O2. CT chest 12/10: No PE. Elevation of R hemidiaphragm. Areas of centrilobular emphysema. atelectasis in both bases. Large left lobe of thyroid with trachea deviation.  Echo 01/23/09: 55-60% mild LVH with pseudonormalizaiton. Mild to moderate MR. RV mild to moderately dilated. Mild RV dysfunction. Mild AS mean 63m HG.  RSVP 67. PFTs FEV1 1.23L (50%) FEV 1.81 (53%) DLCO 43% predicted.  Cath 1/11: RA 11, RV 84/6 with an EDP of 18, PA 83/28 with a mean of 49. PCWP 15  Fick cardiac output 7.0 L/min.  Cardiac index 3.0  PVR is 4.9 Woods units.  Started on Tyvaso (treprostinil) in March 2011. Repeat RHC 11/11 with marked improvement  of pressures. RA 4 RV 43/4 with EDP of 10.  PAP 42/15 with a mean of 25.  PCWP 9 Fick cardiac output 4.7.  Cardiac index 2.1.  PVR 3.4 Woods units.  Femoral artery saturation is 93% on 3 L of supplemental oxygen.  PA saturations were 53% and 57%.  6MW (July 2011): 1929msats 92% rest (3L) ambulations sats to 83%.  Doing fine. Breathing continues to improve. Walks all round [employer] without problem. No problems with Tyvaso. No edema or dizziness.  Echo in 8/11. Normal LV. RV size and function totally normal (reviewed personally). No signifcant TR - unable to measure RV pressure. No recurrent AF.     Problems Prior to Update: 1)  Morbid Obesity  (ICD-278.01) 2)  Coumadin Therapy  (ICD-V58.61) 3)  Atrial Fibrillation  (ICD-427.31) 4)  Edema  (ICD-782.3) 5)  Pre-operative Cardiovascular Examination  (ICD-V72.81) 6)  Shortness of  Breath  (ICD-786.05) 7)  Goiter, Unspecified  (ICD-240.9) 8)  Pulmonary Hypertension  (ICD-416.8) 9)  Hypertension, Unspecified  (ICD-401.9) 10)  Hyperlipidemia-mixed  (ICD-272.4) 11)  Premature Ventricular Contractions  (ICD-427.69) 12)  Palpitations  (ICD-785.1)  Current Medications (verified): 1)  Chlorthalidone 25 Mg Tabs (Chlorthalidone) ...Marland Kitchen 1 Tab Once Daily 2)  Aspirin 81 Mg Tbec (Aspirin) .... Take One Tablet By Mouth Daily 3)  Fluoxetine Hcl 40 Mg Caps (Fluoxetine Hcl) ...Marland Kitchen 1 Cap Once Daily 4)  Omeprazole 20 Mg Tbec (Omeprazole) ...Marland Kitchen 1 Tab Once Daily 5)  Spironolactone 25 Mg Tabs (Spironolactone) .... Take One Tablet By Mouth Daily 6)  Alprazolam 1 Mg Tabs (Alprazolam) .... At Bedtime 7)  Digoxin 0.125 Mg Tabs (Digoxin) .... Take 1 Tablet By Mouth Every Other Day 8)  Warfarin Sodium 5 Mg Tabs (Warfarin Sodium) .... Use As Directed Per Anticoagulative Clinic 9)  Tyvaso 0.6 Mg/ml Soln (Treprostinil) .... 9 Breaths Four Times A Day Per Nebulizer 10)  Lisinopril 20 Mg Tabs (Lisinopril) .... Take 1 Tablet By Mouth Once A Day 11)  Diltiazem Hcl Er Beads 240 Mg Xr24h-Cap (Diltiazem Hcl Er Beads) .... Take One Capsule By Mouth Daily 12)  Kombiglyze Xr 06-998 Mg Xr24h-Tab (Saxagliptin-Metformin) .... Take 1 Tablet By Mouth Once A Day  Allergies (verified): 1)  ! * Dilaudid 2)  ! Pcn 3)  ! Prednisone 4)  ! * Gluten  Past History:  Past Medical History: Last updated: 04/03/2009 1. Hypertension,  severe 2. Hyperlipidemia 3. PVCs 4. 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  Pulmoanry HTN      -- Right atrial pressure mean of 11, RV pressure 84/6 with an EDP of 18, PA pressure 83/28 with a mean of 49.  Pulmonary capillary wedge pressure 15.  Fick cardiac output 7.0 L/min.  Cardiac index 3.0 L/min/m2.  Pulmonary vascular resistance is 4.9 Woods units. 5. Diabetes 6. hx of breast cancer s/p L mastectomy 7. Celiac disease 8. Depression 9. COPD 10. Cor pulmonale 11. Morbid obesity 12.  PUD  Review of Systems       As per HPI and past medical history; otherwise all systems negative.   Vital Signs:  Patient profile:   70 year old female Height:      67 inches Weight:      242 pounds BMI:     38.04 Pulse rate:   68 / minute Resp:     19 per minute BP sitting:   130 / 72  (left arm) Cuff size:   large  Vitals Entered By: Evelene Croon, CMA (April 19, 2010 10:42 AM) CC: pt has no complaints today   Serial Vital Signs/Assessments:  Comments: 6 min.walk--on 3L oxygen.  Rest oxygen sat 96% heart rate 66. Low of 86% at 3 minutes. Continued at 86% for rest of walk. Walked 304 meters. By: Thompson Grayer, RN, BSN    Physical Exam  General:  looks good. Walks briskly with O2. no resp difficulty.  HEENT: normal. + nasal cannula Neck: supple. JVP flat. Carotids 2+ bilat; no bruits. No lymphadenopathy or thryomegaly appreciated. Cor: PMI nondisplaced. Regular rate & rhythm. No rubs, gallops, 2/6 AS 2/6 TR  no RV heave + Left mastectomy Lungs: clear Abdomen: obese soft, nontender, nondistended. No hepatosplenomegaly. No bruits or masses. Good bowel sounds. Extremities: no cyanosis, clubbing, rash, edema Neuro: alert & orientedx3, cranial nerves grossly intact. moves all 4 extremities w/o difficulty. affect pleasant    Impression & Recommendations:  Problem # 1:  PULMONARY HYPERTENSION (ICD-416.8) Doing well. Pressures much improved. NYHA II. Will check 6 MW today, Continue Tyvaso and supplemenatal O2.   Addendum 6MW today estimated at 387m(patient and nurse walked off typical course for a bit) - much improved from previous (1973m However desaturated to 86%. Have instructed her to increase oxygen to 4-5L/min with exertion and follow with her home pulse-ox - keep sats >=90%  Problem # 2:  ATRIAL FIBRILLATION (ICD-427.31) Quiescent. Continue coumadin  Patient Instructions: 1)  Your physician recommends that you schedule a follow-up appointment in: 4 months  with Dr. BeHaroldine Laws)  Your physician recommends that you continue on your current medications as directed. Please refer to the Current Medication list given to you today.

## 2010-05-01 ENCOUNTER — Encounter: Payer: Self-pay | Admitting: *Deleted

## 2010-05-01 DIAGNOSIS — Z7901 Long term (current) use of anticoagulants: Secondary | ICD-10-CM

## 2010-05-01 DIAGNOSIS — I4891 Unspecified atrial fibrillation: Secondary | ICD-10-CM

## 2010-05-03 ENCOUNTER — Ambulatory Visit (INDEPENDENT_AMBULATORY_CARE_PROVIDER_SITE_OTHER): Payer: Medicare Other | Admitting: *Deleted

## 2010-05-03 DIAGNOSIS — I4891 Unspecified atrial fibrillation: Secondary | ICD-10-CM

## 2010-05-03 DIAGNOSIS — Z7901 Long term (current) use of anticoagulants: Secondary | ICD-10-CM | POA: Insufficient documentation

## 2010-05-28 ENCOUNTER — Ambulatory Visit (INDEPENDENT_AMBULATORY_CARE_PROVIDER_SITE_OTHER): Payer: Medicare Other | Admitting: *Deleted

## 2010-05-28 DIAGNOSIS — I4891 Unspecified atrial fibrillation: Secondary | ICD-10-CM

## 2010-05-28 DIAGNOSIS — Z7901 Long term (current) use of anticoagulants: Secondary | ICD-10-CM

## 2010-05-28 LAB — POCT INR: INR: 2.9

## 2010-06-20 ENCOUNTER — Other Ambulatory Visit: Payer: Self-pay | Admitting: *Deleted

## 2010-06-20 ENCOUNTER — Other Ambulatory Visit: Payer: Self-pay | Admitting: Cardiology

## 2010-06-20 MED ORDER — DIGOXIN 125 MCG PO TABS: 125.0000 ug | ORAL_TABLET | ORAL | Status: DC

## 2010-06-25 ENCOUNTER — Encounter: Payer: Medicare Other | Admitting: *Deleted

## 2010-06-28 ENCOUNTER — Ambulatory Visit (INDEPENDENT_AMBULATORY_CARE_PROVIDER_SITE_OTHER): Payer: Medicare Other | Admitting: *Deleted

## 2010-06-28 DIAGNOSIS — Z7901 Long term (current) use of anticoagulants: Secondary | ICD-10-CM

## 2010-06-28 DIAGNOSIS — I4891 Unspecified atrial fibrillation: Secondary | ICD-10-CM

## 2010-06-28 LAB — POCT INR: INR: 2.3

## 2010-06-28 NOTE — Cardiovascular Report (Signed)
[name], [name]              ACCOUNT NO.:  [id number]   MEDICAL RECORD NO.:  [id number]          PATIENT TYPE:  INP   LOCATION:  5956                         FACILITY:  [place]   PHYSICIAN:  Loretha Brasil. Lia Foyer, M.D. 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  She has multiple cardiac risk  factors.  Therefore, cardiac catheterization was felt to be indicated and  she was transferred by Dr. Dannielle Burn for further evaluation and treatment.   PROCEDURES:  1.  Left heart catheterization.  2.  Selective coronary arteriography.  3.  Selective left ventriculography.   DESCRIPTION OF PROCEDURE:  The patient was brought to the catheterization  lab, prepped and draped in usual fashion.  Through an anterior puncture, the  right femoral artery was easily entered.  A 6 French sheath was placed.  Views of the left and right coronaries were obtained in multiple  angiographic projections.  Central aorta and left atrial pressures were  measured with pigtail.  Ventriculography was performed in the RAO  projection.  Following a pressure pullback, the pigtail was removed.  During  the course of the procedure, the patient was noted be hypertensive and was  given two doses of intravenous labetalol, the first been 20 mg and the  second being 10 mg respectively.  She tolerated procedure well was taken to  holding area in satisfactory clinical condition.   HEMODYNAMIC DATA:  1.  Central aorta pressure 212/107.  2.  Left ventricular pressure 191/30.  3.  No gradient or pullback across the aortic valve.   ANGIOGRAPHIC DATA:  1.  Ventriculography was formed in the RAO projection.  Overall systolic      function was preserved.  2.  Left main was free of critical disease.  3.  Left anterior  descending artery coursed to the apex.  There was some      mild luminal irregularity in the LAD particularly in the bend and in the      distal vessel where the vessel tapered rather quickly.  The worst area      was probably no more than 30% narrowing just after the takeoff of the      major diagonal branch.  It did not appear to be high grade.  4.  The circumflex was a large caliber vessel that provided three marginal      branches.  Other than minimal luminal irregularity it was free of      critical disease.  5.  The right coronary artery provided a posterior descending and      posterolateral branch.  There is perhaps 30% narrowing proximal and mid      vessel.  No areas of high-grade obstruction were noted.   CONCLUSION:  1.  Well-preserved left ventricular function.  2.  Systemic hypertension.  3.  Noncritical coronary abnormalities as noted above.   DISPOSITION:  1.  Extensive risk factor  reduction.  2.  I discussed the case with Dr. Dannielle Burn.  She will follow up with him in      [place] and with Dr. Ronne Binning.      TDS/MEDQ  D:  05/03/2004  T:  05/03/2004  Job:  [id number]   cc:   Zollie Beckers, M.D.   Ernestine Mcmurray, M.D. [facility]   CV Laboratory

## 2010-06-28 NOTE — Cardiovascular Report (Signed)
Wanda [name], FAITH              ACCOUNT NO.:  [id number]   MEDICAL RECORD NO.:  [id number]          PATIENT TYPE:  INP   LOCATION:  5638                         FACILITY:  [place]   PHYSICIAN:  Loretha Brasil. Lia Foyer, M.D. [facility] OF BIRTH:  1940-06-02   DATE OF PROCEDURE:  05/03/2004  DATE OF DISCHARGE:                              CARDIAC CATHETERIZATION   INDICATIONS:  A 70 year old female transferred from [facility] because of  symptoms associated with rapid heartbeat.  The patient was seen by Dr.  Dannielle Burn and cardiac catheterization recommended.  She had chest heaviness and  has a history of diabetes, hypertension, hypercholesterolemia, and in fact,  has been a long-term smoker, although recently quit.  She was brought to the  catheterization lab for further evaluation.   PROCEDURES:  1.  Left heart catheterization.  2.  Selective coronary arteriography  3.  Selective left ventriculography.   DESCRIPTION OF PROCEDURE:  The patient was brought to the catheterization  lab, prepped and draped in usual fashion. Through an anterior puncture, the  right femoral artery was easily entered and a 6 French sheath was placed.  The blood pressure was quite high at 212/110.  With this, first a 20 mg dose  of intravenous labetalol was administered, and this was followed by a 10 mg  dose bringing the blood pressure into an acceptable range.  Coronary  arteriography was performed without complication.  Ventriculography was  performed in the RAO projection.  Overall, she tolerated procedure well.  There were no major complications.   HEMODYNAMIC DATA:  1.  Central aortic pressure 212/107.  2.  Left ventricular pressure 191/30.   ANGIOGRAPHIC DATA:  1.  Left main coronary is free of critical disease.  2.  The left anterior descending artery coursed to the apex.   Dictation ended at this point.      TDS/MEDQ  D:  05/03/2004  T:  05/03/2004  Job:  [id number]

## 2010-06-28 NOTE — Discharge Summary (Signed)
NAMEZAHRIYAH, JOO              ACCOUNT NO.:  [id number]   MEDICAL RECORD NO.:  [id number]          PATIENT TYPE:  INP   LOCATION:  4132                         FACILITY:  [place]   PHYSICIAN:  Loretha Brasil. Lia Foyer, M.D. [facility] OF BIRTH:  1940/12/25   DATE OF ADMISSION:  05/02/2004  DATE OF DISCHARGE:                           DISCHARGE SUMMARY - REFERRING   DISCHARGING PHYSICIAN:  Dr. Lia Foyer.   SUMMARY OF HISTORY:  Mr. Weigel is a 70 year old female who, on the day  prior to her [facility] admission, while coming back from grocery shopping,  suddenly developed a rapid and possibly irregular heart rate which lasted  over an hour.  This was associated with shortness of breath, chest tightness  radiating to the neck.  She drove herself to the emergency room, but at the  time of arrival, her symptoms had abated and the EKG did not show any  dysrhythmias.  She did have some sinus tachycardia with occasional PVCs at  [facility].   Her history is notable for a history of palpitations associated with PVCs  with a negative echocardiogram in the past.  She also has a history of  hypertension, dyslipidemia, diabetes, on insulin, breast cancer with left  breast reconstruction, celiac disease and depression, remote tobacco use.   LABORATORY:  At [facility], admission sodium was 141, potassium 3.7,  BUN 13, creatinine 0.8, glucose 207, normal LFTs.  TSH was 0.73 with the T3  uptake of 39, thyroxine 8.7 and an FTI of 8.5.  CK, MBs and troponin were  negative for myocardial infarction.  PTT was 27.4 with a PT of 11.7, INR 0.9  and a BNP of 227.  Hemoglobin and hematocrit were 14.4 and 43.8, normal  indices, platelets 352,000, WBC 10.2.   EKG showed normal sinus rhythm, delayed R wave progression.   Subsequent chemistries and hematology at [facility] were unremarkable.   Chest x-ray at [facility] showed prominent heart size with scarring in the bases  versus vascular congestion.   HOSPITAL COURSE:  Ms. Seese was transferred to [facility] and admitted  to 3700.  She underwent cardiac catheterization on May 03, 2004; please  refer to dictated note.  According to Dr. Maren Beach note, she had a normal  ejection fraction, 30% mid LAD and 30% proximal RCA and it was felt that her  chest discomfort was noncardiac.  A D-dimer was less than 0.22.  After her  bed rest and sheath removal, it was felt that the patient could be  discharged home.  It was noted that during her admission she did not have  any dysrhythmias on telemetry.   DISCHARGE DIAGNOSES:  1.  Noncardiac chest discomfort.  2.  Palpitations.  3.  History as previously.   DISPOSITION:  Her new medication will include Toprol XL 50 mg daily.  She  was asked to continue her lisinopril 10 mg, aspirin 325 mg daily, as well as  her Xanax, Prozac, Avandia and chlorthalidone.   ACTIVITY:  She was advised no lifting, driving, sexual activity or heavy  exertion for 2 days.   DIET:  Maintain low-salt/-fat-cholesterol ADA  diet.   DISCHARGE INSTRUCTIONS:  If she has any problems with her catheterization  site, she was asked to call our office immediately.   FOLLOWUP:  She was asked to call [place] office on Monday to arrange a 2- to 3-  week followup appointment with Dr. Dannielle Burn.      EW/MEDQ  D:  05/03/2004  T:  05/04/2004  Job:  [id number]   cc:   Ernestine Mcmurray, M.D. [facility] [place] Torrie Mayers, MD

## 2010-07-26 ENCOUNTER — Encounter: Payer: Medicare Other | Admitting: *Deleted

## 2010-07-30 ENCOUNTER — Ambulatory Visit (INDEPENDENT_AMBULATORY_CARE_PROVIDER_SITE_OTHER): Payer: Medicare Other | Admitting: *Deleted

## 2010-07-30 DIAGNOSIS — Z7901 Long term (current) use of anticoagulants: Secondary | ICD-10-CM

## 2010-07-30 DIAGNOSIS — I4891 Unspecified atrial fibrillation: Secondary | ICD-10-CM

## 2010-08-22 ENCOUNTER — Other Ambulatory Visit: Payer: Self-pay | Admitting: Cardiology

## 2010-08-27 ENCOUNTER — Encounter: Payer: Self-pay | Admitting: Cardiology

## 2010-08-27 ENCOUNTER — Encounter: Payer: Medicare Other | Admitting: *Deleted

## 2010-09-03 ENCOUNTER — Ambulatory Visit (INDEPENDENT_AMBULATORY_CARE_PROVIDER_SITE_OTHER): Payer: Medicare Other | Admitting: *Deleted

## 2010-09-03 DIAGNOSIS — Z7901 Long term (current) use of anticoagulants: Secondary | ICD-10-CM

## 2010-09-03 DIAGNOSIS — I4891 Unspecified atrial fibrillation: Secondary | ICD-10-CM

## 2010-09-03 LAB — POCT INR: INR: 3.1

## 2010-09-09 ENCOUNTER — Other Ambulatory Visit: Payer: Self-pay | Admitting: *Deleted

## 2010-09-09 MED ORDER — DIGOXIN 125 MCG PO TABS: 125.0000 ug | ORAL_TABLET | ORAL | Status: DC

## 2010-09-09 MED ORDER — WARFARIN SODIUM 5 MG PO TABS: ORAL_TABLET | ORAL | Status: DC

## 2010-09-09 MED ORDER — LISINOPRIL 20 MG PO TABS: 20.0000 mg | ORAL_TABLET | Freq: Every day | ORAL | Status: DC

## 2010-09-10 ENCOUNTER — Other Ambulatory Visit: Payer: Self-pay | Admitting: Internal Medicine

## 2010-09-11 ENCOUNTER — Other Ambulatory Visit: Payer: Self-pay | Admitting: Cardiology

## 2010-09-18 ENCOUNTER — Telehealth: Payer: Self-pay | Admitting: Internal Medicine

## 2010-09-18 NOTE — Telephone Encounter (Signed)
Pharmacy calling regarding RX refill of pt spironolactone. Please return pharmacy call to advise/discuss.  VGJ#:[id number]

## 2010-09-23 ENCOUNTER — Telehealth: Payer: Self-pay | Admitting: Internal Medicine

## 2010-09-23 ENCOUNTER — Other Ambulatory Visit: Payer: Self-pay | Admitting: *Deleted

## 2010-09-23 MED ORDER — SPIRONOLACTONE 25 MG PO TABS: 25.0000 mg | ORAL_TABLET | Freq: Every day | ORAL | Status: DC

## 2010-09-23 NOTE — Telephone Encounter (Signed)
Please return call to provide approval and directions for pt to take spironolactone.  Ref # [id number]

## 2010-09-23 NOTE — Telephone Encounter (Signed)
Spoke w/[facility] and gave verbal order for med

## 2010-10-01 ENCOUNTER — Ambulatory Visit (INDEPENDENT_AMBULATORY_CARE_PROVIDER_SITE_OTHER): Payer: Medicare Other | Admitting: *Deleted

## 2010-10-01 DIAGNOSIS — I4891 Unspecified atrial fibrillation: Secondary | ICD-10-CM

## 2010-10-01 DIAGNOSIS — Z7901 Long term (current) use of anticoagulants: Secondary | ICD-10-CM

## 2010-10-01 LAB — POCT INR: INR: 3.3

## 2010-10-22 ENCOUNTER — Ambulatory Visit (INDEPENDENT_AMBULATORY_CARE_PROVIDER_SITE_OTHER): Payer: Medicare Other | Admitting: *Deleted

## 2010-10-22 DIAGNOSIS — I4891 Unspecified atrial fibrillation: Secondary | ICD-10-CM

## 2010-10-22 DIAGNOSIS — Z7901 Long term (current) use of anticoagulants: Secondary | ICD-10-CM

## 2010-11-19 ENCOUNTER — Ambulatory Visit (INDEPENDENT_AMBULATORY_CARE_PROVIDER_SITE_OTHER): Payer: Medicare Other | Admitting: *Deleted

## 2010-11-19 DIAGNOSIS — I4891 Unspecified atrial fibrillation: Secondary | ICD-10-CM

## 2010-11-19 DIAGNOSIS — Z7901 Long term (current) use of anticoagulants: Secondary | ICD-10-CM

## 2010-12-10 ENCOUNTER — Other Ambulatory Visit: Payer: Self-pay | Admitting: *Deleted

## 2010-12-10 MED ORDER — DIGOXIN 125 MCG PO TABS: 125.0000 ug | ORAL_TABLET | ORAL | Status: DC

## 2010-12-17 ENCOUNTER — Ambulatory Visit (INDEPENDENT_AMBULATORY_CARE_PROVIDER_SITE_OTHER): Payer: Medicare Other | Admitting: *Deleted

## 2010-12-17 DIAGNOSIS — I4891 Unspecified atrial fibrillation: Secondary | ICD-10-CM

## 2010-12-17 DIAGNOSIS — Z7901 Long term (current) use of anticoagulants: Secondary | ICD-10-CM

## 2011-01-07 ENCOUNTER — Ambulatory Visit (INDEPENDENT_AMBULATORY_CARE_PROVIDER_SITE_OTHER): Payer: Medicare Other | Admitting: *Deleted

## 2011-01-07 DIAGNOSIS — Z7901 Long term (current) use of anticoagulants: Secondary | ICD-10-CM

## 2011-01-07 DIAGNOSIS — I4891 Unspecified atrial fibrillation: Secondary | ICD-10-CM

## 2011-01-13 ENCOUNTER — Telehealth: Payer: Self-pay | Admitting: Internal Medicine

## 2011-01-13 ENCOUNTER — Other Ambulatory Visit: Payer: Self-pay | Admitting: *Deleted

## 2011-01-13 MED ORDER — DILTIAZEM HCL ER COATED BEADS 240 MG PO CP24: 240.0000 mg | ORAL_CAPSULE | Freq: Every day | ORAL | Status: DC

## 2011-01-17 ENCOUNTER — Encounter: Payer: Medicare Other | Admitting: *Deleted

## 2011-01-17 ENCOUNTER — Ambulatory Visit: Payer: Medicare Other | Admitting: Cardiology

## 2011-02-07 ENCOUNTER — Ambulatory Visit (INDEPENDENT_AMBULATORY_CARE_PROVIDER_SITE_OTHER): Payer: Medicare Other | Admitting: *Deleted

## 2011-02-07 DIAGNOSIS — I4891 Unspecified atrial fibrillation: Secondary | ICD-10-CM

## 2011-02-07 DIAGNOSIS — Z7901 Long term (current) use of anticoagulants: Secondary | ICD-10-CM

## 2011-02-21 ENCOUNTER — Ambulatory Visit (INDEPENDENT_AMBULATORY_CARE_PROVIDER_SITE_OTHER): Payer: Medicare Other | Admitting: *Deleted

## 2011-02-21 DIAGNOSIS — I4891 Unspecified atrial fibrillation: Secondary | ICD-10-CM

## 2011-02-21 DIAGNOSIS — Z7901 Long term (current) use of anticoagulants: Secondary | ICD-10-CM

## 2011-02-21 LAB — POCT INR: INR: 2

## 2011-02-25 ENCOUNTER — Encounter: Payer: Self-pay | Admitting: *Deleted

## 2011-02-28 ENCOUNTER — Ambulatory Visit: Payer: Medicare Other | Admitting: Cardiology

## 2011-03-06 ENCOUNTER — Ambulatory Visit (INDEPENDENT_AMBULATORY_CARE_PROVIDER_SITE_OTHER): Payer: Medicare Other | Admitting: *Deleted

## 2011-03-06 ENCOUNTER — Ambulatory Visit (INDEPENDENT_AMBULATORY_CARE_PROVIDER_SITE_OTHER): Payer: Medicare Other | Admitting: Cardiology

## 2011-03-06 ENCOUNTER — Encounter: Payer: Self-pay | Admitting: Cardiology

## 2011-03-06 VITALS — BP 116/79 | HR 74 | Ht 67.0 in | Wt 231.0 lb

## 2011-03-06 DIAGNOSIS — I4891 Unspecified atrial fibrillation: Secondary | ICD-10-CM

## 2011-03-06 DIAGNOSIS — I2789 Other specified pulmonary heart diseases: Secondary | ICD-10-CM

## 2011-03-06 DIAGNOSIS — I272 Pulmonary hypertension, unspecified: Secondary | ICD-10-CM

## 2011-03-06 DIAGNOSIS — I480 Paroxysmal atrial fibrillation: Secondary | ICD-10-CM

## 2011-03-06 DIAGNOSIS — Z0389 Encounter for observation for other suspected diseases and conditions ruled out: Secondary | ICD-10-CM

## 2011-03-06 DIAGNOSIS — R0602 Shortness of breath: Secondary | ICD-10-CM

## 2011-03-06 DIAGNOSIS — Z853 Personal history of malignant neoplasm of breast: Secondary | ICD-10-CM

## 2011-03-06 DIAGNOSIS — Z7901 Long term (current) use of anticoagulants: Secondary | ICD-10-CM

## 2011-03-06 LAB — POCT INR: INR: 1.9

## 2011-03-06 NOTE — Patient Instructions (Signed)
Your physician wants you to follow-up in: 6 months. You will receive a reminder letter in the mail one-two months in advance. If you don't receive a letter, please call our office to schedule the follow-up appointment. Your physician recommends that you continue on your current medications as directed. Please refer to the Current Medication list given to you today. 6 minute walk test on oxygen Your physician has recommended that you wear a holter monitor. Holter monitors are medical devices that record the heart's electrical activity. Doctors most often use these monitors to diagnose arrhythmias. Arrhythmias are problems with the speed or rhythm of the heartbeat. The monitor is a small, portable device. You can wear one while you do your normal daily activities. This is usually used to diagnose what is causing palpitations/syncope (passing out). If the results of your test are normal or stable, you will receive a letter. If they are abnormal, the nurse will contact you by phone.

## 2011-03-07 ENCOUNTER — Encounter: Payer: Medicare Other | Admitting: *Deleted

## 2011-03-09 ENCOUNTER — Encounter: Payer: Self-pay | Admitting: Cardiology

## 2011-03-09 DIAGNOSIS — I272 Pulmonary hypertension, unspecified: Secondary | ICD-10-CM | POA: Insufficient documentation

## 2011-03-09 DIAGNOSIS — Z0389 Encounter for observation for other suspected diseases and conditions ruled out: Secondary | ICD-10-CM | POA: Insufficient documentation

## 2011-03-09 DIAGNOSIS — Z853 Personal history of malignant neoplasm of breast: Secondary | ICD-10-CM | POA: Insufficient documentation

## 2011-03-09 DIAGNOSIS — I4891 Unspecified atrial fibrillation: Secondary | ICD-10-CM | POA: Insufficient documentation

## 2011-03-09 NOTE — Progress Notes (Signed)
Wanda Real, MD, [facility] ABIM Board Certified in Adult Cardiovascular Medicine,Internal Medicine and Critical Care Medicine    CC: followup patient with history of paroxysmal atrial fibrillation and pulmonary hypertension  HPI:  The patient is a 71 year old female who I followed for paroxysmal atrial fibrillation.  She is followed in the pulmonary hypertension clinic in [facility] for severe pulmonary hypertension.  However, she has responded remarkably well to treprostinil With improvement of PVR from 4.9 with units to 3.4 which units.  PA systolic pressure decreased from 84 mmHg to 43 mmHg, but on most recent catheterization, cardiac output was significantly lower than during her first cardiac catheterization.  Nevertheless, both numerically and clinically the patient has improved significantly on her vasodilator therapy.  Also her 6 min. Walk, has improved from 198 m per 304 m on 3 L O2 saturation.  I do not think that she had significant desaturation during her last Smith walk, although I am not completely certain about that.  There was some recommendation to increase her oxygen to 4-5 L/m during activity but the patient states that she monitors her oxygen levels with a pulse oximeter and during activity.  She never really goes below 90%. Today, she saw to be back in atrial fibrillation, although clinically she is not aware of this.  Her heart rate also is well controlled.  She reports no tachypalpitations.  She also has no increase in shortness of breath, orthopnea, PND.  Clinically, she has remained stable despite traversing back to atrial fibrillation.   PMH: reviewed and listed in Problem List in Electronic Records (and see below) Past Medical History  Diagnosis Date  . Morbid obesity   . Encounter for long-term (current) use of anticoagulants   . Edema   . Shortness of breath     secondary to pulmonary hypertension on oxygenCT scanDecember 2010.  No pulmonary emboli.  Areas of  centrilobular emphysema.  Large left located trachea deviation.  . Goiter   . Other chronic pulmonary heart diseases     - Right atrial pressure mean of 11, RV pressure 84/6 with an  EDP of 18, PA pressure 83/28 with a mean of 49.  Pulmonary capillary wedge pressure 15.  Fick cardiac output 7.0 L/min.  Cardiac index 3.0 L/min/m2.  Pulmonary vascular resistance is 4.9 Woods units.   Marland Kitchen Unspecified essential hypertension     severe  . Other and unspecified hyperlipidemia   . Other premature beats   . Palpitations   . Diabetes mellitus   . History of breast cancer     status post mastectomy  . Celiac disease   . COPD (chronic obstructive pulmonary disease)   . Pulmonary hypertension     right heart catheterization January 2011, RA 11, RV 84/6 EDP 18, PA 80 of 28 with a mean of 49 over Her wedge pressure 15 mmHg.  Cardiac output 7 L/m cardiac index 3.0, PVR 4.9 with units.started on Treprostinil repeat right heart catheter November 2011, with marked improvement of PA pressures.  PA systolic pressure 42 mmHg.  PA diastolic pressure 15 mmHg cardiac index 2.1.  PVR 3.4 with units.  . PUD (peptic ulcer disease)   . Coronary artery disease (CAD) excluded     Cardiac catheterization 2006  . Paroxysmal atrial fibrillation     on Coumadin therapy   Past Surgical History  Procedure Date  . Cardiac catheterization 2006    non-obs CAD  . Mastectomy   . Dilation and curettage of uterus   .  Hysteroscopy w/ endometrial ablation     [employer]  . Cardiac catheterization 01/08/2010    Allergies/SH/FHX : available in Electronic Records for review  Allergies  Allergen Reactions  . Gluten   . Hydromorphone Hcl     REACTION: low bp  . Penicillins   . Prednisone    History   Social History  . Marital Status: Widowed    Spouse Name: N/A    Number of Children: N/A  . Years of Education: N/A   Occupational History  .      Retired former Therapist, art at [employer]  . Smoking status: Former Smoker    Types: Cigarettes    Quit date: 02/11/1995  . Smokeless tobacco: Never Used  . Alcohol Use: Not on file  . Drug Use: Not on file  . Sexually Active: Not on file   Other Topics Concern  . Not on file   Social History Narrative  . No narrative on file   No family history on file.  Medications: Current Outpatient Prescriptions  Medication Sig Dispense Refill  . ALPRAZolam (XANAX) 1 MG tablet Take 1 mg by mouth at bedtime.      Marland Kitchen aspirin EC 81 MG tablet Take 81 mg by mouth daily.      . digoxin (LANOXIN) 0.125 MG tablet Take 1 tablet (125 mcg total) by mouth every other day.  45 tablet  3  . diltiazem (CARDIZEM CD) 240 MG 24 hr capsule Take 1 capsule (240 mg total) by mouth daily.  90 capsule  3  . exenatide (BYETTA) 10 MCG/0.04ML SOLN Inject 10 mcg into the skin 2 (two) times daily with a meal.      . FLUoxetine (PROZAC) 40 MG capsule Take 40 mg by mouth daily.      Marland Kitchen lisinopril (PRINIVIL,ZESTRIL) 20 MG tablet take 1 tablet by mouth once daily  30 tablet  1  . omeprazole (PRILOSEC) 20 MG capsule Take 20 mg by mouth daily.      . Saxagliptin-Metformin (KOMBIGLYZE XR) 06-998 MG TB24 Take 1 tablet by mouth daily.      Marland Kitchen spironolactone (ALDACTONE) 25 MG tablet Take 1 tablet (25 mg total) by mouth daily.  90 tablet  0  . Treprostinil (TYVASO) 0.6 MG/ML SOLN Inhale 18 mcg into the lungs 4 (four) times daily.      Marland Kitchen warfarin (COUMADIN) 5 MG tablet take 1 and 1/2 tablets once daily or as directed PER COAGULATION CLINIC  135 tablet  2    ROS: No nausea or vomiting. No fever or chills.No melena or hematochezia.No bleeding.No claudication  Physical Exam: BP 116/79  Pulse 74  Ht _0  (1.702 m)  Wt 231 lb (104.781 kg)  BMI 36.18 kg/m2  SpO2 95% General:well-nourished white female.  Wearing oxygen Neck:normal carotid upstroke and no carotid bruits.  No thyromegaly.  Nonnodular thyroid.  JVP is7 cm Lungs:diminished breath sounds bilaterally but  no wheezing Cardiac:irregular rate and rhythmwith normal S1 and prominent second heart sound.  No pathological murmurs Vascular:trace edema.  Normal distal pulses bilaterally Skin:warm and dry Physcologic:normal affect  12lead STM:HDQQIW fibrillation with nonspecific ST-T wave changes Limited bedside ECHO:N/A INR 1.9 today   Patient Active Problem List  Diagnoses  . GOITER, UNSPECIFIED  . HYPERLIPIDEMIA-MIXED  . HYPERTENSION, UNSPECIFIED  . PREMATURE VENTRICULAR CONTRACTIONS  . EDEMA  . PALPITATIONS-resolved  . SHORTNESS OF BREATH-stable  . MORBID OBESITY  . Encounter for long-term (current) use of anticoagulants  .  Paroxysmal atrial fibrillation.  -recurrent  . Coronary artery disease (CAD) excluded  . Pulmonary hypertension-significantly improved.  See details above  . History of breast cancer    PLAN   Patient is back in atrial fibrillation but by her history should be appears to be asymptomatic.  We will formally evaluate this with a 6 min. Walk test, check her oxygen levels as well as her heart rate and walking distance.  I adjusted her Coumadin today and changed the dosing to 7.5 mg by mouth daily.  I asked the patient also to followup with the pulmonary hypertension clinic in [place].  Unless the patient become symptomatic.  I do not see a clear indication to try to restore normal sinus rhythm, but rather to continue rate control as it's unlikely that the patient will remain in normal sinus rhythm without antiarrhythmic therapy and I do not think that her underlying lung disease and pulmonary hypertension amiodarone is a good choice and dofetilide may put her at risk for daily prolongation.

## 2011-03-10 ENCOUNTER — Other Ambulatory Visit: Payer: Self-pay | Admitting: *Deleted

## 2011-03-10 DIAGNOSIS — I4891 Unspecified atrial fibrillation: Secondary | ICD-10-CM

## 2011-03-14 ENCOUNTER — Ambulatory Visit (INDEPENDENT_AMBULATORY_CARE_PROVIDER_SITE_OTHER): Payer: Medicare Other | Admitting: *Deleted

## 2011-03-14 DIAGNOSIS — I4891 Unspecified atrial fibrillation: Secondary | ICD-10-CM

## 2011-03-14 DIAGNOSIS — Z7901 Long term (current) use of anticoagulants: Secondary | ICD-10-CM

## 2011-03-14 LAB — POCT INR: INR: 2.7

## 2011-03-20 ENCOUNTER — Telehealth: Payer: Self-pay | Admitting: *Deleted

## 2011-03-20 NOTE — Telephone Encounter (Signed)
Holter monitor report - per Dr. Dannielle Burn, patient in permanent atrial fib now - no plans for DCCV if she remains asymptomatic.  Heart rate overall controlled, no change in meds for now.  Needs follow up in 4 weeks to discuss & see how she's doing.    Patient notified of above & OV scheduled for 3/11 at 9:30 with GD.

## 2011-03-28 ENCOUNTER — Telehealth: Payer: Self-pay | Admitting: *Deleted

## 2011-03-28 NOTE — Telephone Encounter (Signed)
Pt called and left message that she went to ED on 03/26/11 and INR was 2.5.  LMOM for pt to continue same dose of coumadin and recheck INR on 04/11/11 as previously scheduled.  Told her to call me back if she is on Abx or needs to be checked sooner.

## 2011-04-11 ENCOUNTER — Ambulatory Visit (INDEPENDENT_AMBULATORY_CARE_PROVIDER_SITE_OTHER): Payer: Medicare Other | Admitting: *Deleted

## 2011-04-11 DIAGNOSIS — I4891 Unspecified atrial fibrillation: Secondary | ICD-10-CM

## 2011-04-11 DIAGNOSIS — Z7901 Long term (current) use of anticoagulants: Secondary | ICD-10-CM

## 2011-04-14 ENCOUNTER — Telehealth: Payer: Self-pay | Admitting: Internal Medicine

## 2011-04-14 NOTE — Telephone Encounter (Signed)
Per patient Wanda [name] [phone]  Patient wanted Dr. Haroldine Laws to know that she is sick and terribly sorry she had to cancel 3/5 appnt. She will miss him dearly as he transitions to [facility].  Also she would like to confirm that she should f/u with Dr. Lutricia Feil from this point forward. She can be reached at hm#

## 2011-04-14 NOTE — Telephone Encounter (Signed)
We can see her in the HF clinic a few times a year for her [place].

## 2011-04-15 ENCOUNTER — Ambulatory Visit: Payer: Medicare Other | Admitting: Internal Medicine

## 2011-04-15 ENCOUNTER — Ambulatory Visit: Payer: Medicare Other | Admitting: Cardiology

## 2011-04-15 NOTE — Telephone Encounter (Signed)
Dawn, can you just let her know that Dr Haroldine Laws can see her a couple a times a year to keep a tab on her, thanks

## 2011-04-21 ENCOUNTER — Encounter: Payer: Self-pay | Admitting: Cardiology

## 2011-04-21 ENCOUNTER — Ambulatory Visit (INDEPENDENT_AMBULATORY_CARE_PROVIDER_SITE_OTHER): Payer: Medicare Other | Admitting: Cardiology

## 2011-04-21 VITALS — BP 117/75 | HR 76 | Ht 67.0 in | Wt 227.0 lb

## 2011-04-21 DIAGNOSIS — I728 Aneurysm of other specified arteries: Secondary | ICD-10-CM

## 2011-04-21 DIAGNOSIS — I729 Aneurysm of unspecified site: Secondary | ICD-10-CM

## 2011-04-21 DIAGNOSIS — I4891 Unspecified atrial fibrillation: Secondary | ICD-10-CM

## 2011-04-21 NOTE — Patient Instructions (Addendum)
Continue all current medications. Referral to Vascular Surgery  Your physician wants you to follow up in: 6 months.  You will receive a reminder letter in the mail one-two months in advance.  If you don't receive a letter, please call our office to schedule the follow up appointment

## 2011-04-21 NOTE — Progress Notes (Signed)
Wanda Real, MD, [facility] ABIM Board Certified in Adult Cardiovascular Medicine,Internal Medicine and Critical Care Medicine    CC: followup patient with pulmonary arterial hypertension and atrial fibrillation  HPI:  The patient overall is doing well. She reports no chest pain or increased shortness of breath. During the last office visit she was in atrial fibrillation and we applied a 48 hour Holter monitor. The patient is in permanent atrial fibrillation. I told the patient had no specific plans to cardiovert her, as I suspect long-term she hasn't maintaining normal sinus rhythm or other small. Also the patient was not aware she was in atrial fibrillation and by Holter monitor her rate in general have been well-controlled. Also there was no deterioration in her exercise capacity is at the same time we did a 6 minute walk while she was in atrial fibrillation and the patient ambulated 412.8 m. She completed to walk on 3 L of oxygen with minimal decrease in oxygen levels. She reported moderate shortness of breath. She denies any palpitations orthopnea or PND She did point out to me that she had some swelling which seems to be pulsating just above her left eye. The patient reports no visual loss or facial pain. No fever or chills. INR levels in general have been poorly controlled.    PMH: reviewed and listed in Problem List in Electronic Records (and see below) Past Medical History  Diagnosis Date  . Morbid obesity   . Encounter for long-term (current) use of anticoagulants   . Edema   . Shortness of breath     secondary to pulmonary hypertension on oxygenCT scanDecember 2010.  No pulmonary emboli.  Areas of centrilobular emphysema.  Large left located trachea deviation.  . Goiter   . Other chronic pulmonary heart diseases     - Right atrial pressure mean of 11, RV pressure 84/6 with an  EDP of 18, PA pressure 83/28 with a mean of 49.  Pulmonary capillary wedge pressure 15.  Fick cardiac  output 7.0 L/min.  Cardiac index 3.0 L/min/m2.  Pulmonary vascular resistance is 4.9 Woods units.   Marland Kitchen Unspecified essential hypertension     severe  . Other and unspecified hyperlipidemia   . Other premature beats   . Palpitations   . Diabetes mellitus   . History of breast cancer     status post mastectomy  . Celiac disease   . COPD (chronic obstructive pulmonary disease)   . Pulmonary hypertension     right heart catheterization January 2011, RA 11, RV 84/6 EDP 18, PA 80 of 28 with a mean of 49 over Her wedge pressure 15 mmHg.  Cardiac output 7 L/m cardiac index 3.0, PVR 4.9 with units.started on Treprostinil repeat right heart catheter November 2011, with marked improvement of PA pressures.  PA systolic pressure 42 mmHg.  PA diastolic pressure 15 mmHg cardiac index 2.1.  PVR 3.4 with units.  . PUD (peptic ulcer disease)   . Coronary artery disease (CAD) excluded     Cardiac catheterization 2006  . Paroxysmal atrial fibrillation     on Coumadin therapy   Past Surgical History  Procedure Date  . Cardiac catheterization 2006    non-obs CAD  . Mastectomy   . Dilation and curettage of uterus   . Hysteroscopy w/ endometrial ablation     [facility]  . Cardiac catheterization 01/08/2010    Allergies/SH/FHX : available in Electronic Records for review  Allergies  Allergen Reactions  . Gluten   .  Hydromorphone Hcl     REACTION: low bp  . Penicillins   . Prednisone    History   Social History  . Marital Status: Widowed    Spouse Name: N/A    Number of Children: N/A  . Years of Education: N/A   Occupational History  .      Retired former Therapist, art at [employer]  . Smoking status: Former Smoker    Types: Cigarettes    Quit date: 02/11/1995  . Smokeless tobacco: Never Used  . Alcohol Use: Not on file  . Drug Use: Not on file  . Sexually Active: Not on file   Other Topics Concern  . Not on file   Social History Narrative  . No  narrative on file   No family history on file.  Medications: Current Outpatient Prescriptions  Medication Sig Dispense Refill  . ALPRAZolam (XANAX) 1 MG tablet Take 1 mg by mouth at bedtime.      Marland Kitchen aspirin EC 81 MG tablet Take 81 mg by mouth daily.      . digoxin (LANOXIN) 0.125 MG tablet Take 1 tablet (125 mcg total) by mouth every other day.  45 tablet  3  . diltiazem (CARDIZEM CD) 240 MG 24 hr capsule Take 1 capsule (240 mg total) by mouth daily.  90 capsule  3  . exenatide (BYETTA) 10 MCG/0.04ML SOLN Inject 10 mcg into the skin 2 (two) times daily with a meal.      . fexofenadine (ALLEGRA) 180 MG tablet Take 180 mg by mouth daily.      Marland Kitchen FLUoxetine (PROZAC) 40 MG capsule Take 40 mg by mouth daily.      Marland Kitchen lisinopril (PRINIVIL,ZESTRIL) 20 MG tablet take 1 tablet by mouth once daily  30 tablet  1  . omeprazole (PRILOSEC) 20 MG capsule Take 20 mg by mouth daily.      . Saxagliptin-Metformin (KOMBIGLYZE XR) 06-998 MG TB24 Take 1 tablet by mouth daily.      Marland Kitchen spironolactone (ALDACTONE) 25 MG tablet Take 1 tablet (25 mg total) by mouth daily.  90 tablet  0  . Treprostinil (TYVASO) 0.6 MG/ML SOLN Inhale 18 mcg into the lungs 4 (four) times daily.      Marland Kitchen warfarin (COUMADIN) 5 MG tablet take 1 and 1/2 tablets once daily or as directed PER COAGULATION CLINIC  135 tablet  2    ROS: No nausea or vomiting. No fever or chills.No melena or hematochezia.No bleeding.No claudication  Physical Exam: BP 117/75  Pulse 76  Ht _0  (1.702 m)  Wt 227 lb (102.967 kg)  BMI 35.55 kg/m2  SpO2 94% General: Well-nourished white female in no apparent distress Neck: Normal carotid upstroke no carotid bruits. No thyromegaly nonnodular thyroid. Referred to image: Pulsating mass about half a centimeter consistent with aneurysm of the frontal branch of the superficial temporal artery. The area is nontender to pulsating. Lungs: Clear breath sounds bilaterally somewhat diminished but no wheezing Cardiac: Irregular  rate and rhythm with normal S1-S2 no pathological murmurs Vascular: No edema. Normal distal pulses bilaterally Skin: Warm and dry Physcologic: Normal affect      12lead ECG: Not performed Limited bedside ECHO:N/A No images are attached to the encounter.    Patient Active Problem List  Diagnoses  . GOITER, UNSPECIFIED  . HYPERLIPIDEMIA-MIXED  . HYPERTENSION, UNSPECIFIED  . PREMATURE VENTRICULAR CONTRACTIONS  . EDEMA  . PALPITATIONS  . SHORTNESS OF BREATH  . MORBID OBESITY  .  Encounter for long-term (current) use of anticoagulants  .  permanent atrial fibrillation-continue anticoagulation   . Coronary artery disease (CAD) excluded  . Pulmonary hypertension-followed in [place] improved on treprostinil   . History of breast cancer  . 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 This is not associated with pain or tenderness. She does not appear to have temporal arteritis. Nevertheless I will refer her to vascular surgery as apparently this area is getting slowly bigger.  The patient is also in permanent atrial fibrillation and based on her 6 minute walk as well as her clinical symptoms the patient is unaware that she is in atrial fibrillation. I do not think that there is an indication to try to restore normal sinus rhythm but rather to continue Coumadin  We discussed Coumadin management as her INR levels in general have been poorly controlled.  The patient will followup in heart failure clinic for further management and additional therapy and possible followup right heart catheterization for pulmonary hypertension if so needed.

## 2011-04-29 ENCOUNTER — Ambulatory Visit (INDEPENDENT_AMBULATORY_CARE_PROVIDER_SITE_OTHER): Payer: Medicare Other | Admitting: *Deleted

## 2011-04-29 DIAGNOSIS — I4891 Unspecified atrial fibrillation: Secondary | ICD-10-CM

## 2011-04-29 DIAGNOSIS — Z7901 Long term (current) use of anticoagulants: Secondary | ICD-10-CM

## 2011-04-29 LAB — POCT INR: INR: 2.9

## 2011-05-13 ENCOUNTER — Other Ambulatory Visit: Payer: Self-pay | Admitting: *Deleted

## 2011-05-13 DIAGNOSIS — I728 Aneurysm of other specified arteries: Secondary | ICD-10-CM

## 2011-05-14 ENCOUNTER — Ambulatory Visit (HOSPITAL_COMMUNITY)
Admission: RE | Admit: 2011-05-14 | Discharge: 2011-05-14 | Disposition: A | Discharge status: Home / Self Care | Payer: Medicare Other | Source: Ambulatory Visit | Attending: Cardiology | Admitting: Cardiology

## 2011-05-14 DIAGNOSIS — I728 Aneurysm of other specified arteries: Secondary | ICD-10-CM

## 2011-05-21 ENCOUNTER — Ambulatory Visit (HOSPITAL_COMMUNITY)
Admission: RE | Admit: 2011-05-21 | Discharge: 2011-05-21 | Disposition: A | Discharge status: Home / Self Care | Payer: Medicare Other | Source: Ambulatory Visit | Attending: Internal Medicine | Admitting: Internal Medicine

## 2011-05-21 VITALS — BP 100/54 | HR 75 | Wt 230.2 lb

## 2011-05-21 DIAGNOSIS — I480 Paroxysmal atrial fibrillation: Secondary | ICD-10-CM

## 2011-05-21 DIAGNOSIS — I4891 Unspecified atrial fibrillation: Secondary | ICD-10-CM

## 2011-05-21 DIAGNOSIS — I272 Pulmonary hypertension, unspecified: Secondary | ICD-10-CM

## 2011-05-21 DIAGNOSIS — I2789 Other specified pulmonary heart diseases: Secondary | ICD-10-CM | POA: Insufficient documentation

## 2011-05-21 NOTE — Assessment & Plan Note (Addendum)
Doing very well from [facility] standpoint. PA pressures normalized by cath and RV normal on echo. Recent 6MW distance impressive. Will continue Tyvaso. No change at this point.

## 2011-05-21 NOTE — Assessment & Plan Note (Signed)
Tolerating AF well. Agree with rate control. Given variability in INR I wonder if switching her to novel anti-coagulant like apixaban may be a better option. She is willing to consider this. I have asked her to f/u with Dr. Dannielle Burn to further discuss.

## 2011-05-21 NOTE — Progress Notes (Signed)
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  We have been following her for pulmonary hypertension.  Found to be hypoxemic in Dec 2010 started on O2. CT chest 12/10: No PE. Elevation of R hemidiaphragm. Areas of centrilobular emphysema. atelectasis in both bases. Large left lobe of thyroid with trachea deviation.  Echo 01/23/09: 55-60% mild LVH with pseudonormalizaiton. Mild to moderate MR. RV mild to moderately dilated. Mild RV dysfunction. Mild AS mean 7m HG.  RSVP 67. PFTs FEV1 1.23L (50%) FEV 1.81 (53%) DLCO 43% predicted.  Cath 1/11: RA 11, RV 84/6 with an EDP of 18, PA 83/28 with a mean of 49. PCWP 15  Fick cardiac output 7.0 L/min.  Cardiac index 3.0  PVR is 4.9 Woods units.  Started on Tyvaso (treprostinil) in March 2011. Repeat RHC 11/11 with marked improvement  of pressures. RA 4 RV 43/4 with EDP of 10.  PAP 42/15 with a mean of 25.  PCWP 9 Fick cardiac output 4.7.  Cardiac index 2.1.  PVR 3.4 Woods units.  Femoral artery saturation is 93% on 3 L of supplemental oxygen.  PA saturations were 53% and 57%.  6MW (July 2011): 19980msats 92% rest (3L) ambulations sats to 83%. Echo in 8/11. Normal LV. RV size and function totally normal (reviewed personally). No signifcant TR - unable to measure RV pressure.  She has [facility] In January 2013 had 6MW with Dr. HeKoleen Nimroduring which she covered only 112.80m67mee scanned report). Sats dropped to 87% and O2 increased. [place] 6MW [place] January or February and per Dr. DegArlina Robeste she went 412.80m!1mho rerpeated and looked ok. Holter monitor showed chronic AF with good rate control. No problems with Tyvaso. No edema or dizziness. No syncope. No bleeding coumadin. Continue on home O2.   Over past few months has noticed a pulsatile lump on left temporal area.  Saw Dr. DegeDannielle Burn felt it was likely aneurysm of the superficial branch of left temporal artery. Sent to see Dr. DeveKathi Ludwig elected to follow it conservatively given her high procedural risk.    ROS: All systems negative except as listed in HPI, PMH and Problem List.  Past Medical History  Diagnosis Date  . Morbid obesity   . Encounter for long-term (current) use of anticoagulants   . Edema   . Shortness of breath     secondary to pulmonary hypertension on oxygenCT scanDecember 2010.  No pulmonary emboli.  Areas of centrilobular emphysema.  Large left located trachea deviation.  . Goiter   . Other chronic pulmonary heart diseases     - Right atrial pressure mean of 11, RV pressure 84/6 with an  EDP of 18, PA pressure 83/28 with a mean of 49.  Pulmonary capillary wedge pressure 15.  Fick cardiac output 7.0 L/min.  Cardiac index 3.0 L/min/m2.  Pulmonary vascular resistance is 4.9 Woods units.   . UnMarland Kitchenpecified essential hypertension     severe  . Other and unspecified hyperlipidemia   . Other premature beats   . Palpitations   . Diabetes mellitus   . History of breast cancer     status post mastectomy  . Celiac disease   . COPD (chronic obstructive pulmonary disease)   . Pulmonary hypertension     right heart catheterization January 2011, RA 11, RV  84/6 EDP 18, PA 80 of 28 with a mean of 49 over Her wedge pressure 15 mmHg.  Cardiac output 7 L/m cardiac index 3.0, PVR 4.9 with units.started on Treprostinil repeat right heart catheter November 2011, with marked improvement of PA pressures.  PA systolic pressure 42 mmHg.  PA diastolic pressure 15 mmHg cardiac index 2.1.  PVR 3.4 with units.  . PUD (peptic ulcer disease)   . Coronary artery disease (CAD) excluded     Cardiac catheterization 2006  . Paroxysmal atrial fibrillation     on Coumadin therapy    Current Outpatient Prescriptions  Medication Sig Dispense Refill  . ALPRAZolam (XANAX) 1 MG tablet Take 1 mg by mouth at bedtime.       Marland Kitchen aspirin EC 81 MG tablet Take 81 mg by mouth daily.      . digoxin (LANOXIN) 0.125 MG tablet Take 1 tablet (125 mcg total) by mouth every other day.  45 tablet  3  . diltiazem (CARDIZEM CD) 240 MG 24 hr capsule Take 1 capsule (240 mg total) by mouth daily.  90 capsule  3  . exenatide (BYETTA) 10 MCG/0.04ML SOLN Inject 10 mcg into the skin 2 (two) times daily with a meal.      . fexofenadine (ALLEGRA) 180 MG tablet Take 180 mg by mouth daily.      Marland Kitchen FLUoxetine (PROZAC) 40 MG capsule Take 40 mg by mouth daily.      Marland Kitchen lisinopril (PRINIVIL,ZESTRIL) 20 MG tablet take 1 tablet by mouth once daily  30 tablet  1  . omeprazole (PRILOSEC) 20 MG capsule Take 20 mg by mouth daily.      Marland Kitchen spironolactone (ALDACTONE) 25 MG tablet Take 1 tablet (25 mg total) by mouth daily.  90 tablet  0  . Treprostinil (TYVASO) 0.6 MG/ML SOLN Inhale 18 mcg into the lungs 4 (four) times daily.      Marland Kitchen warfarin (COUMADIN) 5 MG tablet take 1 and 1/2 tablets once daily or as directed PER COAGULATION CLINIC  135 tablet  2     PHYSICAL EXAM: Filed Vitals:   05/21/11 1348  BP: 100/54  Pulse: 75   Weight change:  General: Well-nourished white female in no apparent distress  Neck: Normal carotid upstroke no carotid bruits. No thyromegaly nonnodular thyroid.  Pulsating mass about half a centimeter consistent with aneurysm of the frontal branch of the superficial temporal artery. The area is nontender to pulpation. Lungs: Clear breath sounds bilaterally somewhat diminished but no wheezing  Cardiac: Irregular rate and rhythm with normal S1-S2 no pathological murmurs  Vascular: No edema. Normal distal pulses bilaterally  Skin: Warm and dry  Physcologic: Normal affect    ASSESSMENT & PLAN:

## 2011-05-27 ENCOUNTER — Ambulatory Visit (INDEPENDENT_AMBULATORY_CARE_PROVIDER_SITE_OTHER): Payer: Medicare Other | Admitting: *Deleted

## 2011-05-27 DIAGNOSIS — Z7901 Long term (current) use of anticoagulants: Secondary | ICD-10-CM

## 2011-05-27 DIAGNOSIS — I4891 Unspecified atrial fibrillation: Secondary | ICD-10-CM

## 2011-05-27 DIAGNOSIS — I480 Paroxysmal atrial fibrillation: Secondary | ICD-10-CM

## 2011-05-27 LAB — POCT INR: INR: 2.7

## 2011-06-24 ENCOUNTER — Ambulatory Visit (INDEPENDENT_AMBULATORY_CARE_PROVIDER_SITE_OTHER): Payer: Medicare Other | Admitting: *Deleted

## 2011-06-24 DIAGNOSIS — Z7901 Long term (current) use of anticoagulants: Secondary | ICD-10-CM

## 2011-06-24 DIAGNOSIS — I4891 Unspecified atrial fibrillation: Secondary | ICD-10-CM

## 2011-06-24 DIAGNOSIS — I480 Paroxysmal atrial fibrillation: Secondary | ICD-10-CM

## 2011-07-22 ENCOUNTER — Ambulatory Visit (INDEPENDENT_AMBULATORY_CARE_PROVIDER_SITE_OTHER): Payer: Medicare Other | Admitting: *Deleted

## 2011-07-22 DIAGNOSIS — I4891 Unspecified atrial fibrillation: Secondary | ICD-10-CM

## 2011-07-22 DIAGNOSIS — Z7901 Long term (current) use of anticoagulants: Secondary | ICD-10-CM

## 2011-07-22 DIAGNOSIS — I480 Paroxysmal atrial fibrillation: Secondary | ICD-10-CM

## 2011-08-05 ENCOUNTER — Ambulatory Visit (INDEPENDENT_AMBULATORY_CARE_PROVIDER_SITE_OTHER): Payer: Medicare Other | Admitting: *Deleted

## 2011-08-05 DIAGNOSIS — I480 Paroxysmal atrial fibrillation: Secondary | ICD-10-CM

## 2011-08-05 DIAGNOSIS — Z7901 Long term (current) use of anticoagulants: Secondary | ICD-10-CM

## 2011-08-05 DIAGNOSIS — I4891 Unspecified atrial fibrillation: Secondary | ICD-10-CM

## 2011-09-09 ENCOUNTER — Ambulatory Visit (INDEPENDENT_AMBULATORY_CARE_PROVIDER_SITE_OTHER): Payer: Medicare Other | Admitting: *Deleted

## 2011-09-09 DIAGNOSIS — I4891 Unspecified atrial fibrillation: Secondary | ICD-10-CM

## 2011-09-09 DIAGNOSIS — Z7901 Long term (current) use of anticoagulants: Secondary | ICD-10-CM

## 2011-09-09 DIAGNOSIS — I480 Paroxysmal atrial fibrillation: Secondary | ICD-10-CM

## 2011-09-19 ENCOUNTER — Other Ambulatory Visit: Payer: Self-pay | Admitting: Cardiology

## 2011-09-19 MED ORDER — LISINOPRIL 20 MG PO TABS: 20.0000 mg | ORAL_TABLET | Freq: Every day | ORAL | Status: DC

## 2011-09-19 NOTE — Telephone Encounter (Signed)
Wanda [name] needs to have Lisinopril 20 mg sent to [facility].

## 2011-09-26 ENCOUNTER — Other Ambulatory Visit: Payer: Self-pay | Admitting: *Deleted

## 2011-09-26 MED ORDER — LISINOPRIL 20 MG PO TABS: 20.0000 mg | ORAL_TABLET | Freq: Every day | ORAL | Status: DC

## 2011-09-26 NOTE — Telephone Encounter (Signed)
Patient called in and left VM for nurse. She needed a refill for Lisinopril 20 mg and asked that [facility]. [facility].

## 2011-09-26 NOTE — Telephone Encounter (Signed)
Message left on nurse's voicemail that a refill is needed. Patient did not specify which one. Please call this patient to get additional information.

## 2011-10-07 ENCOUNTER — Ambulatory Visit (INDEPENDENT_AMBULATORY_CARE_PROVIDER_SITE_OTHER): Payer: Medicare Other | Admitting: *Deleted

## 2011-10-07 DIAGNOSIS — I480 Paroxysmal atrial fibrillation: Secondary | ICD-10-CM

## 2011-10-07 DIAGNOSIS — I4891 Unspecified atrial fibrillation: Secondary | ICD-10-CM

## 2011-10-07 DIAGNOSIS — Z7901 Long term (current) use of anticoagulants: Secondary | ICD-10-CM

## 2011-10-20 ENCOUNTER — Telehealth: Payer: Self-pay | Admitting: *Deleted

## 2011-10-20 ENCOUNTER — Encounter: Payer: Self-pay | Admitting: *Deleted

## 2011-10-20 NOTE — Progress Notes (Signed)
  This encounter was created in error - please disregard.

## 2011-10-20 NOTE — Telephone Encounter (Signed)
Decrease coumadin to 1 tablet daily and recheck INR on 10/24/11.  Pt told and verbalized understanding.  Also may eat greens/salads this week.

## 2011-10-20 NOTE — Telephone Encounter (Signed)
Mrs. Wanda [name] has UTI was started on Bactrim today 10-20-2011 by Dr. Woody Seller. She was told to cut back on coumdin and Mrs. Wanda [name] Is concerned about this. Please ask Wanda [name] to review and call patient.

## 2011-10-24 ENCOUNTER — Ambulatory Visit (INDEPENDENT_AMBULATORY_CARE_PROVIDER_SITE_OTHER): Payer: Medicare Other | Admitting: *Deleted

## 2011-10-24 DIAGNOSIS — Z7901 Long term (current) use of anticoagulants: Secondary | ICD-10-CM

## 2011-10-24 DIAGNOSIS — I4891 Unspecified atrial fibrillation: Secondary | ICD-10-CM

## 2011-10-24 DIAGNOSIS — I480 Paroxysmal atrial fibrillation: Secondary | ICD-10-CM

## 2011-10-31 ENCOUNTER — Ambulatory Visit (INDEPENDENT_AMBULATORY_CARE_PROVIDER_SITE_OTHER): Payer: Medicare Other | Admitting: *Deleted

## 2011-10-31 DIAGNOSIS — Z7901 Long term (current) use of anticoagulants: Secondary | ICD-10-CM

## 2011-10-31 DIAGNOSIS — I4891 Unspecified atrial fibrillation: Secondary | ICD-10-CM

## 2011-10-31 DIAGNOSIS — I480 Paroxysmal atrial fibrillation: Secondary | ICD-10-CM

## 2011-11-10 ENCOUNTER — Other Ambulatory Visit: Payer: Self-pay | Admitting: Cardiology

## 2011-11-10 MED ORDER — DILTIAZEM HCL ER COATED BEADS 240 MG PO CP24: 240.0000 mg | ORAL_CAPSULE | Freq: Every day | ORAL | Status: DC

## 2011-11-21 ENCOUNTER — Ambulatory Visit (INDEPENDENT_AMBULATORY_CARE_PROVIDER_SITE_OTHER): Payer: Medicare Other | Admitting: *Deleted

## 2011-11-21 DIAGNOSIS — I4891 Unspecified atrial fibrillation: Secondary | ICD-10-CM

## 2011-11-21 DIAGNOSIS — I480 Paroxysmal atrial fibrillation: Secondary | ICD-10-CM

## 2011-11-21 DIAGNOSIS — Z7901 Long term (current) use of anticoagulants: Secondary | ICD-10-CM

## 2011-12-09 ENCOUNTER — Ambulatory Visit (INDEPENDENT_AMBULATORY_CARE_PROVIDER_SITE_OTHER): Payer: Medicare Other | Admitting: *Deleted

## 2011-12-09 DIAGNOSIS — I480 Paroxysmal atrial fibrillation: Secondary | ICD-10-CM

## 2011-12-09 DIAGNOSIS — Z7901 Long term (current) use of anticoagulants: Secondary | ICD-10-CM

## 2011-12-09 DIAGNOSIS — I4891 Unspecified atrial fibrillation: Secondary | ICD-10-CM

## 2011-12-15 ENCOUNTER — Ambulatory Visit (HOSPITAL_COMMUNITY): Payer: Medicare Other

## 2011-12-22 ENCOUNTER — Ambulatory Visit (HOSPITAL_COMMUNITY)
Admission: RE | Admit: 2011-12-22 | Discharge: 2011-12-22 | Disposition: A | Discharge status: Home / Self Care | Payer: Medicare Other | Source: Ambulatory Visit | Attending: Internal Medicine | Admitting: Internal Medicine

## 2011-12-22 VITALS — BP 120/68 | HR 75 | Wt 216.0 lb

## 2011-12-22 DIAGNOSIS — I4891 Unspecified atrial fibrillation: Secondary | ICD-10-CM

## 2011-12-22 DIAGNOSIS — I272 Pulmonary hypertension, unspecified: Secondary | ICD-10-CM

## 2011-12-22 DIAGNOSIS — I2789 Other specified pulmonary heart diseases: Secondary | ICD-10-CM | POA: Insufficient documentation

## 2011-12-22 NOTE — Patient Instructions (Addendum)
Follow up at [facility] with an ECHO   Follow up in 3-4 months with Dr Haroldine Laws

## 2011-12-22 NOTE — Progress Notes (Signed)
6 min walk test completed, pt walked 1320 ft on 3L O2 sat ranged from 91-95%

## 2011-12-22 NOTE — Assessment & Plan Note (Addendum)
Overall much improved. She stopped taking Tyvaso 4 months ago and prefers to stay off it. I am not sure this is the right strategy based on her response to therapy. Will recheck echo - if PA pressures climbing may need to reconsider. Continue O2 supplementation as needed - she will follow with pulse-ox. 6 MW 404 feet. Follow up in 3 months.

## 2011-12-23 ENCOUNTER — Other Ambulatory Visit (HOSPITAL_COMMUNITY): Payer: Medicare Other

## 2011-12-26 ENCOUNTER — Other Ambulatory Visit: Payer: Self-pay | Admitting: Cardiology

## 2011-12-26 ENCOUNTER — Ambulatory Visit (HOSPITAL_COMMUNITY)
Admission: RE | Admit: 2011-12-26 | Discharge: 2011-12-26 | Disposition: A | Discharge status: Home / Self Care | Payer: Medicare Other | Source: Ambulatory Visit | Attending: Adult Health | Admitting: Adult Health

## 2011-12-26 DIAGNOSIS — E785 Hyperlipidemia, unspecified: Secondary | ICD-10-CM | POA: Insufficient documentation

## 2011-12-26 DIAGNOSIS — I059 Rheumatic mitral valve disease, unspecified: Secondary | ICD-10-CM

## 2011-12-26 DIAGNOSIS — J4489 Other specified chronic obstructive pulmonary disease: Secondary | ICD-10-CM | POA: Insufficient documentation

## 2011-12-26 DIAGNOSIS — I079 Rheumatic tricuspid valve disease, unspecified: Secondary | ICD-10-CM | POA: Insufficient documentation

## 2011-12-26 DIAGNOSIS — I270 Primary pulmonary hypertension: Secondary | ICD-10-CM | POA: Insufficient documentation

## 2011-12-26 DIAGNOSIS — I272 Pulmonary hypertension, unspecified: Secondary | ICD-10-CM

## 2011-12-26 DIAGNOSIS — I517 Cardiomegaly: Secondary | ICD-10-CM | POA: Insufficient documentation

## 2011-12-26 DIAGNOSIS — I10 Essential (primary) hypertension: Secondary | ICD-10-CM | POA: Insufficient documentation

## 2011-12-26 DIAGNOSIS — J449 Chronic obstructive pulmonary disease, unspecified: Secondary | ICD-10-CM | POA: Insufficient documentation

## 2011-12-26 MED ORDER — DIGOXIN 125 MCG PO TABS: 125.0000 ug | ORAL_TABLET | ORAL | Status: DC

## 2011-12-26 NOTE — Progress Notes (Signed)
  Echocardiogram 2D Echocardiogram has been performed.  Janalee Dane M 12/26/2011, 10:55 AM

## 2011-12-30 ENCOUNTER — Encounter: Payer: Self-pay | Admitting: Cardiology

## 2011-12-30 ENCOUNTER — Ambulatory Visit (INDEPENDENT_AMBULATORY_CARE_PROVIDER_SITE_OTHER): Payer: Medicare Other | Admitting: *Deleted

## 2011-12-30 ENCOUNTER — Ambulatory Visit (INDEPENDENT_AMBULATORY_CARE_PROVIDER_SITE_OTHER): Payer: Medicare Other | Admitting: Cardiology

## 2011-12-30 VITALS — BP 110/67 | HR 73 | Ht 68.0 in | Wt 216.8 lb

## 2011-12-30 DIAGNOSIS — I4891 Unspecified atrial fibrillation: Secondary | ICD-10-CM

## 2011-12-30 DIAGNOSIS — I272 Pulmonary hypertension, unspecified: Secondary | ICD-10-CM

## 2011-12-30 DIAGNOSIS — Z7901 Long term (current) use of anticoagulants: Secondary | ICD-10-CM

## 2011-12-30 DIAGNOSIS — I480 Paroxysmal atrial fibrillation: Secondary | ICD-10-CM

## 2011-12-30 DIAGNOSIS — I2789 Other specified pulmonary heart diseases: Secondary | ICD-10-CM

## 2011-12-30 LAB — POCT INR: INR: 2.4

## 2011-12-30 NOTE — Progress Notes (Signed)
Clinical Summary Wanda [name] is a 71 y.o.female presenting for followup. She is a former patient of Dr. Dannielle Burn, prefers to stay with [place]. She was just recently seen in early November in the advanced heart failure clinic in [place], where she follows for pulmonary hypertension as well.  Records reviewed. She stopped Tyvaso 4 months and she can not tell a difference. Her 6 minute walk test just recently showed 1320 feet on 3 L oxygen with saturations ranging 91-95%. Followup echocardiogram showed mild LVH with LVEF 65-70%, mild mitral regurgitation, moderate left atrial enlargement, mildly dilated right ventricle with normal function, moderately to severely dilated right atrium, mild tricuspid regurgitation, PA systolic pressure 46 mm mercury based on somewhat incomplete TR velocity envelope.  She states that she feels "quite well." She uses oxygen at night only intermittently. Denies any palpitations. Reports NYHA class II dyspnea on exertion typically. ECG today shows rate-controlled atrial fibrillation with nonspecific ST-T changes.   Allergies  Allergen Reactions  . Gluten   . Hydromorphone Hcl     REACTION: low bp  . Penicillins   . Prednisone     Current Outpatient Prescriptions  Medication Sig Dispense Refill  . ALPRAZolam (XANAX) 1 MG tablet Take 1 mg by mouth at bedtime.      Marland Kitchen aspirin EC 81 MG tablet Take 81 mg by mouth daily.      Marland Kitchen atorvastatin (LIPITOR) 10 MG tablet Take 10 mg by mouth daily.      . digoxin (LANOXIN) 0.125 MG tablet Take 1 tablet (125 mcg total) by mouth every other day.  45 tablet  0  . diltiazem (CARDIZEM CD) 240 MG 24 hr capsule Take 1 capsule (240 mg total) by mouth daily.  90 capsule  3  . exenatide (BYETTA) 10 MCG/0.04ML SOLN Inject 10 mcg into the skin 2 (two) times daily with a meal.      . FLUoxetine (PROZAC) 40 MG capsule Take 40 mg by mouth daily.      Marland Kitchen lisinopril (PRINIVIL,ZESTRIL) 20 MG tablet Take 1 tablet (20 mg total) by mouth daily.   90 tablet  3  . omeprazole (PRILOSEC) 20 MG capsule Take 20 mg by mouth daily.      Marland Kitchen spironolactone (ALDACTONE) 25 MG tablet Take 1 tablet (25 mg total) by mouth daily.  90 tablet  0  . warfarin (COUMADIN) 5 MG tablet take 1 and 1/2 tablets once daily or as directed PER COAGULATION CLINIC  135 tablet  2    Past Medical History  Diagnosis Date  . Long term (current) use of anticoagulants   . Goiter   . Essential hypertension, benign   . Mixed hyperlipidemia   . Type 2 diabetes mellitus   . History of breast cancer     Status post mastectomy  . Celiac disease   . COPD (chronic obstructive pulmonary disease)   . Pulmonary hypertension     right heart catheterization January 2011, RA 11, RV 84/6 EDP 18, PA 80 of 28 with a mean of 49 over Her wedge pressure 15 mmHg.  Cardiac output 7 L/m cardiac index 3.0, PVR 4.9 with units.started on Treprostinil repeat right heart catheter November 2011, with marked improvement of PA pressures.  PA systolic pressure 42 mmHg.  PA diastolic pressure 15 mmHg cardiac index 2.1.  PVR 3.4 with units.  . PUD (peptic ulcer disease)   . History of cardiac catheterization     No significant CAD 2006  . Paroxysmal atrial fibrillation  Coumadin therapy    Social History Ms. Vanyo reports that she quit smoking about 16 years ago. Her smoking use included Cigarettes. She has never used smokeless tobacco. Ms. Tavano reports that she does not drink alcohol.  Review of Systems No reported bleeding episodes on Coumadin. No cough, hemoptysis. Stable appetite. Otherwise negative.  Physical Examination Filed Vitals:   12/30/11 1422  BP: 110/67  Pulse: 73   Filed Weights   12/30/11 1422  Weight: 216 lb 12.8 oz (98.34 kg)   No acute distress. HEENT: Conjunctiva and lids normal, oropharynx clear. Neck: Supple, no elevated JVP or carotid bruits, no thyromegaly. Lungs: Clear to auscultation, diminished, nonlabored breathing at rest. Cardiac: Irregularly  irregular, distant, no S3. Abdomen: Soft, nontender, bowel sounds present. Extremities: No pitting edema, distal pulses 2+. Skin: Warm and dry.   Problem List and Plan   Atrial fibrillation Persistent, rate controlled, and on Coumadin. No plan for cardioversion or antiarrhythmic therapy at this time with low likelihood of maintaining sinus rhythm in light of other underlying heart disease with chamber enlargement and pulmonary hypertension.  Pulmonary hypertension Followed in the advanced heart failure clinic in [place]. She is doing well symptomatically. Now off Tyvaso with PRN oxygen use. Recent followup echocardiogram noted.    Satira Sark, M.D., F.A.C.C.

## 2011-12-30 NOTE — Patient Instructions (Addendum)

## 2011-12-30 NOTE — Assessment & Plan Note (Signed)
Persistent, rate controlled, and on Coumadin. No plan for cardioversion or antiarrhythmic therapy at this time with low likelihood of maintaining sinus rhythm in light of other underlying heart disease with chamber enlargement and pulmonary hypertension.

## 2011-12-30 NOTE — Assessment & Plan Note (Signed)
Followed in the advanced heart failure clinic in [place]. She is doing well symptomatically. Now off Tyvaso with PRN oxygen use. Recent followup echocardiogram noted.

## 2012-01-10 NOTE — Progress Notes (Signed)
Patient ID: Wanda [name], female   DOB: Jul 26, 1940, 71 y.o.   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  We have been following her for pulmonary hypertension.  Found to be hypoxemic in Dec 2010 started on O2. CT chest 12/10: No PE. Elevation of R hemidiaphragm. Areas of centrilobular emphysema. atelectasis in both bases. Large left lobe of thyroid with trachea deviation.  Echo 01/23/09: 55-60% mild LVH with pseudonormalizaiton. Mild to moderate MR. RV mild to moderately dilated. Mild RV dysfunction. Mild AS mean 55m HG.  RSVP 67. PFTs FEV1 1.23L (50%) FEV 1.81 (53%) DLCO 43% predicted.  Cath 1/11: RA 11, RV 84/6 with an EDP of 18, PA 83/28 with a mean of 49. PCWP 15  Fick cardiac output 7.0 L/min.  Cardiac index 3.0  PVR is 4.9 Woods units.  Started on Tyvaso (treprostinil) in March 2011. Repeat RHC 11/11 with marked improvement  of pressures. RA 4 RV 43/4 with EDP of 10.  PAP 42/15 with a mean of 25.  PCWP 9 Fick cardiac output 4.7.  Cardiac index 2.1.  PVR 3.4 Woods units.  Femoral artery saturation is 93% on 3 L of supplemental oxygen.  PA saturations were 53% and 57%.  6MW (July 2011): 1977msats 92% rest (3L) ambulations sats to 83%. Echo in 8/11. Normal LV. RV size and function totally normal (reviewed personally). No signifcant TR - unable to measure RV pressure. 6MW Jan or Feb 2013 418 meters. 6 MW 12/22/11 404 meters on 3 liters [place]  She returns for follow up with her boyfriend. She stopped Tyvaso 4 months and she can not tell a difference.  Feels fine. Denies SOB/PND/Orthopnea. Does admit she has dyspnea going up stairs. She continues on 3 liters [place] continuous. Compliant with medications. Weight at home going down. At least 14 pounds.   ROS: All systems negative except as listed in HPI, PMH and Problem List.  Past Medical History  Diagnosis  Date  . Long term (current) use of anticoagulants   . Goiter   . Essential hypertension, benign   . Mixed hyperlipidemia   . Type 2 diabetes mellitus   . History of breast cancer     Status post mastectomy  . Celiac disease   . COPD (chronic obstructive pulmonary disease)   . Pulmonary hypertension     right heart catheterization January 2011, RA 11, RV 84/6 EDP 18, PA 80 of 28 with a mean of 49 over Her wedge pressure 15 mmHg.  Cardiac output 7 L/m cardiac index 3.0, PVR 4.9 with units.started on Treprostinil repeat right heart catheter November 2011, with marked improvement of PA pressures.  PA systolic pressure 42 mmHg.  PA diastolic pressure 15 mmHg cardiac index 2.1.  PVR 3.4 with units.  . PUD (peptic ulcer disease)   . History of cardiac catheterization     No significant CAD 2006  . Paroxysmal atrial fibrillation     Coumadin therapy    Current Outpatient Prescriptions  Medication Sig Dispense Refill  . ALPRAZolam (XANAX) 1 MG tablet Take 1 mg by mouth at bedtime.      . Marland Kitchenspirin EC 81 MG tablet Take 81 mg by mouth daily.      . Marland Kitchentorvastatin (LIPITOR) 10 MG tablet Take 10 mg by mouth daily.      . Marland Kitcheniltiazem (CARDIZEM CD) 240 MG 24  hr capsule Take 1 capsule (240 mg total) by mouth daily.  90 capsule  3  . exenatide (BYETTA) 10 MCG/0.04ML SOLN Inject 10 mcg into the skin 2 (two) times daily with a meal.      . FLUoxetine (PROZAC) 40 MG capsule Take 40 mg by mouth daily.      Marland Kitchen lisinopril (PRINIVIL,ZESTRIL) 20 MG tablet Take 1 tablet (20 mg total) by mouth daily.  90 tablet  3  . omeprazole (PRILOSEC) 20 MG capsule Take 20 mg by mouth daily.      Marland Kitchen spironolactone (ALDACTONE) 25 MG tablet Take 1 tablet (25 mg total) by mouth daily.  90 tablet  0  . warfarin (COUMADIN) 5 MG tablet take 1 and 1/2 tablets once daily or as directed PER COAGULATION CLINIC  135 tablet  2  . digoxin (LANOXIN) 0.125 MG tablet Take 1 tablet (125 mcg total) by mouth every other day.  45 tablet  0      PHYSICAL EXAM: Filed Vitals:   12/22/11 1414  BP: 120/68  Pulse: 75   Weight change:  216 ( 230) General: Well-nourished white female in no apparent distress  Neck: Normal carotid upstroke no carotid bruits. No thyromegaly nonnodular thyroid.  Lungs: Clear breath sounds bilaterally somewhat diminished but no wheezing 3 liters [place] Cardiac: Irregular rate and rhythm with normal R4-M3 soft systolic murmur over aortic valve Vascular: No edema. Normal distal pulses bilaterally  Skin: Warm and dry  Forehead ecchymotic on L Physcologic: Normal affect    ASSESSMENT & PLAN:

## 2012-01-10 NOTE — Assessment & Plan Note (Signed)
Doing well. We discussed possibility of NOAC but she will remain on coumadin for now. Consider stopping digoxin.

## 2012-01-20 ENCOUNTER — Ambulatory Visit (INDEPENDENT_AMBULATORY_CARE_PROVIDER_SITE_OTHER): Payer: Medicare Other | Admitting: *Deleted

## 2012-01-20 DIAGNOSIS — Z7901 Long term (current) use of anticoagulants: Secondary | ICD-10-CM

## 2012-01-20 DIAGNOSIS — I4891 Unspecified atrial fibrillation: Secondary | ICD-10-CM

## 2012-01-20 LAB — POCT INR: INR: 2.6

## 2012-02-17 ENCOUNTER — Ambulatory Visit (INDEPENDENT_AMBULATORY_CARE_PROVIDER_SITE_OTHER): Payer: Medicare Other | Admitting: *Deleted

## 2012-02-17 DIAGNOSIS — Z7901 Long term (current) use of anticoagulants: Secondary | ICD-10-CM

## 2012-02-17 DIAGNOSIS — I4891 Unspecified atrial fibrillation: Secondary | ICD-10-CM

## 2012-02-23 ENCOUNTER — Telehealth: Payer: Self-pay | Admitting: Cardiology

## 2012-02-23 MED ORDER — WARFARIN SODIUM 5 MG PO TABS: ORAL_TABLET | ORAL | Status: DC

## 2012-02-23 NOTE — Telephone Encounter (Signed)
Needs warfin 54m - took last one this morning [facility]

## 2012-02-23 NOTE — Telephone Encounter (Signed)
Left message to inform pt that refill request for coumadin was sent in to [facility]

## 2012-03-19 ENCOUNTER — Ambulatory Visit (INDEPENDENT_AMBULATORY_CARE_PROVIDER_SITE_OTHER): Payer: Medicare Other | Admitting: *Deleted

## 2012-03-19 DIAGNOSIS — Z7901 Long term (current) use of anticoagulants: Secondary | ICD-10-CM

## 2012-03-19 DIAGNOSIS — I4891 Unspecified atrial fibrillation: Secondary | ICD-10-CM

## 2012-05-04 ENCOUNTER — Ambulatory Visit (INDEPENDENT_AMBULATORY_CARE_PROVIDER_SITE_OTHER): Payer: Medicare Other | Admitting: *Deleted

## 2012-05-04 DIAGNOSIS — I4891 Unspecified atrial fibrillation: Secondary | ICD-10-CM

## 2012-05-04 DIAGNOSIS — Z7901 Long term (current) use of anticoagulants: Secondary | ICD-10-CM

## 2012-05-10 ENCOUNTER — Other Ambulatory Visit: Payer: Self-pay | Admitting: Cardiology

## 2012-06-15 ENCOUNTER — Ambulatory Visit (INDEPENDENT_AMBULATORY_CARE_PROVIDER_SITE_OTHER): Payer: Medicare Other | Admitting: *Deleted

## 2012-06-15 DIAGNOSIS — I4891 Unspecified atrial fibrillation: Secondary | ICD-10-CM

## 2012-06-15 DIAGNOSIS — Z7901 Long term (current) use of anticoagulants: Secondary | ICD-10-CM

## 2012-06-15 LAB — POCT INR: INR: 3.1

## 2012-07-20 ENCOUNTER — Telehealth: Payer: Self-pay | Admitting: *Deleted

## 2012-07-20 MED ORDER — WARFARIN SODIUM 5 MG PO TABS: ORAL_TABLET | ORAL | Status: DC

## 2012-07-20 NOTE — Telephone Encounter (Signed)
Refill done.  

## 2012-07-27 ENCOUNTER — Ambulatory Visit (INDEPENDENT_AMBULATORY_CARE_PROVIDER_SITE_OTHER): Payer: Medicare Other | Admitting: *Deleted

## 2012-07-27 DIAGNOSIS — Z7901 Long term (current) use of anticoagulants: Secondary | ICD-10-CM

## 2012-07-27 DIAGNOSIS — I4891 Unspecified atrial fibrillation: Secondary | ICD-10-CM

## 2012-07-27 LAB — POCT INR: INR: 3.2

## 2012-08-17 ENCOUNTER — Encounter: Payer: Self-pay | Admitting: Cardiology

## 2012-08-17 ENCOUNTER — Ambulatory Visit (INDEPENDENT_AMBULATORY_CARE_PROVIDER_SITE_OTHER): Payer: Medicare Other | Admitting: *Deleted

## 2012-08-17 ENCOUNTER — Ambulatory Visit (INDEPENDENT_AMBULATORY_CARE_PROVIDER_SITE_OTHER): Payer: Medicare Other | Admitting: Cardiology

## 2012-08-17 VITALS — BP 137/93 | HR 92 | Ht 68.0 in | Wt 207.1 lb

## 2012-08-17 DIAGNOSIS — I4891 Unspecified atrial fibrillation: Secondary | ICD-10-CM

## 2012-08-17 DIAGNOSIS — I2789 Other specified pulmonary heart diseases: Secondary | ICD-10-CM

## 2012-08-17 DIAGNOSIS — I272 Pulmonary hypertension, unspecified: Secondary | ICD-10-CM

## 2012-08-17 DIAGNOSIS — Z7901 Long term (current) use of anticoagulants: Secondary | ICD-10-CM

## 2012-08-17 DIAGNOSIS — I10 Essential (primary) hypertension: Secondary | ICD-10-CM

## 2012-08-17 LAB — POCT INR: INR: 2.4

## 2012-08-17 NOTE — Assessment & Plan Note (Signed)
Blood pressure is elevated today, was normal at the last visit. She checks it at home, states control is typically quite good. No changes made today.

## 2012-08-17 NOTE — Progress Notes (Signed)
Clinical Summary Wanda [name] is a 72 y.o.female last seen in November 2013. She states that overall things have been stable. She is not using oxygen at this time, reports Wanda [name] class II dyspnea. She checks blood pressure and oxygen saturation at home, states that her saturations are usually in the mid 90s, blood pressure much better controlled than noted today. She reports compliance with her medications. She will be following up with Dr. Candida [name] in August. Her weight is down 10 pounds from November.  Echocardiogram from November 2013 showed mild LVH with LVEF 65-70%, mild mitral regurgitation, moderate left atrial enlargement, mildly dilated right ventricle with normal function, moderately to severely dilated right atrium, mild tricuspid regurgitation, PA systolic pressure 46 mm mercury based on somewhat incomplete TR velocity envelope.   Allergies  Allergen Reactions  . Gluten   . Hydromorphone Hcl     REACTION: low bp  . Penicillins   . Prednisone     Current Outpatient Prescriptions  Medication Sig Dispense Refill  . ALPRAZolam (XANAX) 1 MG tablet Take 1 mg by mouth at bedtime.      Marland Kitchen aspirin EC 81 MG tablet Take 81 mg by mouth daily.      Marland Kitchen atorvastatin (LIPITOR) 10 MG tablet Take 10 mg by mouth daily.      . cefUROXime (CEFTIN) 250 MG tablet Take 250 mg by mouth 2 (two) times daily. Patient should finish on 08-19-2012      . digoxin (LANOXIN) 0.125 MG tablet Take 0.125 mg by mouth every other day.      . diltiazem (CARDIZEM CD) 240 MG 24 hr capsule Take 1 capsule (240 mg total) by mouth daily.  90 capsule  3  . lisinopril (PRINIVIL,ZESTRIL) 20 MG tablet Take 1 tablet (20 mg total) by mouth daily.  90 tablet  3  . spironolactone (ALDACTONE) 25 MG tablet Take 1 tablet (25 mg total) by mouth daily.  90 tablet  0  . warfarin (COUMADIN) 5 MG tablet take 1 and 1/2 tablets once daily or as directed PER COAGULATION CLINIC  135 tablet  0   No current facility-administered medications for  this visit.    Past Medical History  Diagnosis Date  . Goiter   . Essential hypertension, benign   . Mixed hyperlipidemia   . Type 2 diabetes mellitus   . History of breast cancer     Status post mastectomy  . Celiac disease   . COPD (chronic obstructive pulmonary disease)   . Pulmonary hypertension   . PUD (peptic ulcer disease)   . History of cardiac catheterization     No significant CAD 2006  . Paroxysmal atrial fibrillation     Coumadin therapy    Social History Wanda [name] reports that she quit smoking about 17 years ago. Her smoking use included Cigarettes. She smoked 0.00 packs per day. She has never used smokeless tobacco. Wanda [name] reports that she does not drink alcohol.  Review of Systems An intermittent sense of palpitations, no orthopnea or PND. Stable appetite. No bleeding episodes on Coumadin. Otherwise negative.  Physical Examination Filed Vitals:   08/17/12 1307  BP: 137/93  Pulse: 92   Filed Weights   08/17/12 1301  Weight: 207 lb 1.9 oz (93.949 kg)    No acute distress.  HEENT: Conjunctiva and lids normal, oropharynx clear.  Neck: Supple, no elevated JVP or carotid bruits, no thyromegaly.  Lungs: Clear to auscultation, diminished, nonlabored breathing at rest.  Cardiac: Irregularly irregular, distant, no  S3.  Abdomen: Soft, nontender, bowel sounds present.  Extremities: No pitting edema, distal pulses 2+.  Skin: Warm and dry.   Problem List and Plan   Pulmonary hypertension Stable medical regimen, not using oxygen at this time. Weight down 10 pounds from November. Keep scheduled followup with Dr. Haroldine [name]. Echocardiogram from last November showed mild tricuspid regurgitation with PASP 46 mm mercury based on somewhat incomplete TR envelope.  Essential hypertension, benign Blood pressure is elevated today, was normal at the last visit. She checks it at home, states control is typically quite good. No changes made today.  Atrial  fibrillation Paroxysmal to persistent. Continue strategy of heart rate control and anticoagulation. Unlikely to hold sinus rhythm with underlying substrate.    Wanda [name], M.D., F.A.C.C.

## 2012-08-17 NOTE — Patient Instructions (Signed)
Continue all current medications. Your physician wants you to follow up in: 6 months.  You will receive a reminder letter in the mail one-two months in advance.  If you don't receive a letter, please call our office to schedule the follow up appointment   

## 2012-08-17 NOTE — Assessment & Plan Note (Signed)
Stable medical regimen, not using oxygen at this time. Weight down 10 pounds from November. Keep scheduled followup with Dr. Haroldine Laws. Echocardiogram from last November showed mild tricuspid regurgitation with PASP 46 mm mercury based on somewhat incomplete TR envelope.

## 2012-08-17 NOTE — Assessment & Plan Note (Signed)
Paroxysmal to persistent. Continue strategy of heart rate control and anticoagulation. Unlikely to hold sinus rhythm with underlying substrate.

## 2012-09-09 ENCOUNTER — Ambulatory Visit (HOSPITAL_COMMUNITY)
Admission: RE | Admit: 2012-09-09 | Discharge: 2012-09-09 | Disposition: A | Discharge status: Home / Self Care | Payer: Medicare Other | Source: Ambulatory Visit | Attending: Internal Medicine | Admitting: Internal Medicine

## 2012-09-09 VITALS — BP 110/80 | HR 76 | Wt 209.0 lb

## 2012-09-09 DIAGNOSIS — I4891 Unspecified atrial fibrillation: Secondary | ICD-10-CM | POA: Insufficient documentation

## 2012-09-09 DIAGNOSIS — I509 Heart failure, unspecified: Secondary | ICD-10-CM | POA: Insufficient documentation

## 2012-09-09 DIAGNOSIS — I2789 Other specified pulmonary heart diseases: Secondary | ICD-10-CM

## 2012-09-09 DIAGNOSIS — I272 Pulmonary hypertension, unspecified: Secondary | ICD-10-CM

## 2012-09-09 NOTE — Patient Instructions (Addendum)
Schedule ECHO at [facility]   Stop digoxin   Follow up in 4 to 6 months

## 2012-09-14 ENCOUNTER — Ambulatory Visit (INDEPENDENT_AMBULATORY_CARE_PROVIDER_SITE_OTHER): Payer: Medicare Other | Admitting: *Deleted

## 2012-09-14 DIAGNOSIS — I4891 Unspecified atrial fibrillation: Secondary | ICD-10-CM

## 2012-09-14 DIAGNOSIS — Z7901 Long term (current) use of anticoagulants: Secondary | ICD-10-CM

## 2012-09-15 ENCOUNTER — Other Ambulatory Visit: Payer: Medicare Other

## 2012-09-16 ENCOUNTER — Other Ambulatory Visit: Payer: Self-pay

## 2012-09-16 ENCOUNTER — Other Ambulatory Visit (INDEPENDENT_AMBULATORY_CARE_PROVIDER_SITE_OTHER): Payer: Medicare Other

## 2012-09-16 DIAGNOSIS — I2789 Other specified pulmonary heart diseases: Secondary | ICD-10-CM

## 2012-09-16 DIAGNOSIS — I4891 Unspecified atrial fibrillation: Secondary | ICD-10-CM

## 2012-09-16 DIAGNOSIS — I272 Pulmonary hypertension, unspecified: Secondary | ICD-10-CM

## 2012-09-19 NOTE — Progress Notes (Signed)
Patient ID: Wanda [name], female   DOB: [birth year], 72 y.o.   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   Found to be hypoxemic in Dec 2010 started on O2. CT chest 12/10: No PE. Elevation of R hemidiaphragm. Areas of centrilobular emphysema. atelectasis in both bases. Large left lobe of thyroid with trachea deviation.  Echo 01/23/09: 55-60% mild LVH with pseudonormalizaiton. Mild to moderate MR. RV mild to moderately dilated. Mild RV dysfunction. Mild AS mean 25m HG.  RSVP 67. PFTs FEV1 1.23L (50%) FEV 1.81 (53%) DLCO 43% predicted.  Cath 1/11: RA 11, RV 84/6 with an EDP of 18, PA 83/28 with a mean of 49. PCWP 15  Fick cardiac output 7.0 L/min.  Cardiac index 3.0  PVR is 4.9 Woods units.  Started on Tyvaso (treprostinil) in March 2011. Repeat RHC 11/11 with marked improvement  of pressures. RA 4 RV 43/4 with EDP of 10.  PAP 42/15 with a mean of 25.  PCWP 9 Fick cardiac output 4.7.  Cardiac index 2.1.  PVR 3.4 Woods units.  Femoral artery saturation is 93% on 3 L of supplemental oxygen.  PA saturations were 53% and 57%.  6MW (July 2011): 1912msats 92% rest (3L) ambulations sats to 83%. Echo in 8/11. Normal LV. RV size and function totally normal (reviewed personally). No signifcant TR - unable to measure RV pressure. 6MW Jan or Feb 2013 418 meters. 6 MW 12/22/11 404 meters on 3 liters [place]  ECHO 12/26/2011 EF 55-60% Peak PA pressure 46  Stopped taking Tyvaso August 2013.   She returns for follow up. Feeling very good. Much more functional. Denies SOB/PND/Orthopnea. Denies dizziness. She no longer uses oxygen. Compliant with medications. Weight at home trending down. Focusing on taking care of her husband.   ROS: All systems negative except as listed in HPI, PMH and Problem List.  Past Medical History  Diagnosis Date  . Goiter    . Essential hypertension, benign   . Mixed hyperlipidemia   . Type 2 diabetes mellitus   . History of breast cancer     Status post mastectomy  . Celiac disease   . COPD (chronic obstructive pulmonary disease)   . Pulmonary hypertension   . PUD (peptic ulcer disease)   . History of cardiac catheterization     No significant CAD 2006  . Paroxysmal atrial fibrillation     Coumadin therapy    Current Outpatient Prescriptions  Medication Sig Dispense Refill  . ALPRAZolam (XANAX) 1 MG tablet Take 1 mg by mouth at bedtime.      . Marland Kitchenspirin EC 81 MG tablet Take 81 mg by mouth daily.      . Marland Kitchentorvastatin (LIPITOR) 10 MG tablet Take 10 mg by mouth daily.      . Marland Kitcheniltiazem (CARDIZEM CD) 240 MG 24 hr capsule Take 1 capsule (240 mg total) by mouth daily.  90 capsule  3  . lisinopril (PRINIVIL,ZESTRIL) 20 MG tablet Take 1 tablet (20 mg total) by mouth daily.  90 tablet  3  . spironolactone (ALDACTONE) 25 MG tablet Take 1 tablet (25 mg total) by mouth daily.  90 tablet  0  . warfarin (COUMADIN) 5 MG tablet take 1 and 1/2 tablets once daily or as directed PER COAGULATION CLINIC  135 tablet  0   No current facility-administered medications for this encounter.  PHYSICAL EXAM: Filed Vitals:   09/09/12 1132  BP: 110/80  Pulse: 76   Weight change:  216>209 General: Well-nourished white female in no apparent distress  Neck: Normal carotid upstroke no carotid bruits. No thyromegaly nonnodular thyroid. JVP ~ 5 Lungs: Clear breath sounds bilaterally somewhat diminished but no wheezing on room air Cardiac: Irregular rate and rhythm with normal H1-A4 soft systolic murmur over aortic valve Vascular: No edema. Normal distal pulses bilaterally  Skin: Warm and dry   Physcologic: Normal affect    ASSESSMENT & PLAN:  1. Pulmonary Hypertension She is doing very well. Pulmonary pressure markedly improved on last echo.  NYHA I-II of all meds and O2. She has lost a significant amount of weight. She  does not desaturate with exertion. We will check f/u echo to confirm RV and PA pressures are still OK.    2. A Fib, chronic Rate Controlled. On cardizem. Continue coumadin. Offered NOAC however she declines. Stop digoxin.  3. COPD Stable. Off O2 and doing well.    Darnell Stimson,MD 6:20 PM

## 2012-09-20 ENCOUNTER — Other Ambulatory Visit: Payer: Self-pay | Admitting: Cardiology

## 2012-09-30 ENCOUNTER — Encounter (HOSPITAL_COMMUNITY): Payer: Self-pay | Admitting: *Deleted

## 2012-10-08 ENCOUNTER — Telehealth (HOSPITAL_COMMUNITY): Payer: Self-pay | Admitting: Cardiology

## 2012-10-08 NOTE — Telephone Encounter (Signed)
Returned call to review ECHO results

## 2012-10-08 NOTE — Telephone Encounter (Signed)
Pt given echo results

## 2012-10-15 ENCOUNTER — Ambulatory Visit (INDEPENDENT_AMBULATORY_CARE_PROVIDER_SITE_OTHER): Payer: Medicare Other | Admitting: *Deleted

## 2012-10-15 DIAGNOSIS — I4891 Unspecified atrial fibrillation: Secondary | ICD-10-CM

## 2012-10-15 DIAGNOSIS — Z7901 Long term (current) use of anticoagulants: Secondary | ICD-10-CM

## 2012-10-15 LAB — POCT INR: INR: 2.1

## 2012-11-12 ENCOUNTER — Ambulatory Visit (INDEPENDENT_AMBULATORY_CARE_PROVIDER_SITE_OTHER): Payer: Medicare Other | Admitting: *Deleted

## 2012-11-12 DIAGNOSIS — Z7901 Long term (current) use of anticoagulants: Secondary | ICD-10-CM

## 2012-11-12 DIAGNOSIS — I4891 Unspecified atrial fibrillation: Secondary | ICD-10-CM

## 2012-11-12 LAB — POCT INR: INR: 2.1

## 2012-12-24 ENCOUNTER — Ambulatory Visit (INDEPENDENT_AMBULATORY_CARE_PROVIDER_SITE_OTHER): Payer: Medicare Other | Admitting: *Deleted

## 2012-12-24 DIAGNOSIS — I4891 Unspecified atrial fibrillation: Secondary | ICD-10-CM

## 2012-12-24 DIAGNOSIS — Z7901 Long term (current) use of anticoagulants: Secondary | ICD-10-CM

## 2013-01-21 ENCOUNTER — Ambulatory Visit (INDEPENDENT_AMBULATORY_CARE_PROVIDER_SITE_OTHER): Payer: Medicare Other | Admitting: *Deleted

## 2013-01-21 DIAGNOSIS — Z7901 Long term (current) use of anticoagulants: Secondary | ICD-10-CM

## 2013-01-21 DIAGNOSIS — I4891 Unspecified atrial fibrillation: Secondary | ICD-10-CM

## 2013-01-21 LAB — POCT INR: INR: 1.9

## 2013-02-17 ENCOUNTER — Encounter: Payer: Self-pay | Admitting: Cardiology

## 2013-02-17 ENCOUNTER — Ambulatory Visit (INDEPENDENT_AMBULATORY_CARE_PROVIDER_SITE_OTHER): Payer: Medicare Other | Admitting: Cardiology

## 2013-02-17 ENCOUNTER — Encounter: Payer: Medicare Other | Admitting: Cardiology

## 2013-02-17 ENCOUNTER — Encounter (INDEPENDENT_AMBULATORY_CARE_PROVIDER_SITE_OTHER): Payer: Self-pay

## 2013-02-17 VITALS — BP 114/77 | HR 77 | Ht 68.0 in | Wt 216.1 lb

## 2013-02-17 DIAGNOSIS — I2789 Other specified pulmonary heart diseases: Secondary | ICD-10-CM

## 2013-02-17 DIAGNOSIS — I272 Pulmonary hypertension, unspecified: Secondary | ICD-10-CM

## 2013-02-17 DIAGNOSIS — I4891 Unspecified atrial fibrillation: Secondary | ICD-10-CM

## 2013-02-17 NOTE — Progress Notes (Signed)
Error   This encounter was created in error - please disregard. 

## 2013-02-17 NOTE — Assessment & Plan Note (Signed)
Has been followed in the CHF clinic, improvement noted over time and doing well. Last echocardiogram in August 2014 showed normal RV contraction with RVSP approximately 45 mm mercury.

## 2013-02-17 NOTE — Assessment & Plan Note (Signed)
Paroxysmal to persistent. Continue strategy of heart rate control and anticoagulation. ECG reviewed.

## 2013-02-17 NOTE — Patient Instructions (Signed)
Your physician recommends that you schedule a follow-up appointment in: 6 months. You will receive a reminder letter in the mail in about 4 months reminding you to call and schedule your appointment. If you don't receive this letter, please contact our office. Your physician recommends that you continue on your current medications as directed. Please refer to the Current Medication list given to you today.

## 2013-02-17 NOTE — Progress Notes (Signed)
Clinical Summary Wanda [name] (formerly Hawes) is a 73 y.o.female last seen in July 2014. Interval visit with CHF clinic noted. She has done well from a cardiac perspective, no major sense of palpitations, no rapid rates when she checks her heart beat. Weight is up from the last visit, but she does not endorse any increasing shortness of breath or edema.  She has been under a lot of emotional strain, her recent husband is at home with Hospice care. She seems to be holding up fairly well and has family support.  ECG today shows rate-controlled atrial fibrillation. She continues on Cardizem CD and Coumadin.  Echocardiogram from August 2014 showed moderate LVH with LVEF 60-65%, mild to moderate left atrial enlargement, normal RV size and contraction, RVSP approximately 45 mm mercury.   Allergies  Allergen Reactions  . Gluten   . Hydromorphone Hcl     REACTION: low bp  . Penicillins   . Prednisone     Current Outpatient Prescriptions  Medication Sig Dispense Refill  . ALPRAZolam (XANAX) 1 MG tablet Take 1 mg by mouth at bedtime.      Marland Kitchen aspirin EC 81 MG tablet Take 81 mg by mouth daily.      Marland Kitchen atorvastatin (LIPITOR) 10 MG tablet Take 10 mg by mouth every evening.       . diltiazem (CARDIZEM CD) 240 MG 24 hr capsule Take 240 mg by mouth daily.      Marland Kitchen lisinopril (PRINIVIL,ZESTRIL) 20 MG tablet TAKE 1 TABLET DAILY  90 tablet  2  . spironolactone (ALDACTONE) 25 MG tablet Take 1 tablet (25 mg total) by mouth daily.  90 tablet  0  . trimethoprim (TRIMPEX) 100 MG tablet Take 100 mg by mouth daily.      Marland Kitchen warfarin (COUMADIN) 5 MG tablet take 1 and 1/2 tablets once daily or as directed PER COAGULATION CLINIC  135 tablet  0   No current facility-administered medications for this visit.    Past Medical History  Diagnosis Date  . Goiter   . Essential hypertension, benign   . Mixed hyperlipidemia   . Type 2 diabetes mellitus   . History of breast cancer     Status [name] mastectomy  .  Celiac disease   . COPD (chronic obstructive pulmonary disease)   . Pulmonary hypertension   . PUD (peptic ulcer disease)   . History of cardiac catheterization     No significant CAD 2006  . Paroxysmal atrial fibrillation     Coumadin therapy    Social History Wanda [name] reports that she quit smoking about 18 years ago. Her smoking use included Cigarettes. She smoked 0.00 packs per day. She has never used smokeless tobacco. Wanda [name] reports that she does not drink alcohol.  Review of Systems No bleeding episodes, no syncope, no hospitalizations. Stable appetite. Otherwise negative.  Physical Examination Filed Vitals:   02/17/13 1433  BP: 114/77  Pulse: 77   Filed Weights   02/17/13 1433  Weight: 216 lb 1.9 oz (98.031 kg)    No acute distress.  HEENT: Conjunctiva and lids normal, oropharynx clear.  Neck: Supple, no elevated JVP or carotid bruits, no thyromegaly.  Lungs: Clear to auscultation, diminished, nonlabored breathing at rest.  Cardiac: Irregularly irregular, distant, no S3.  Abdomen: Soft, nontender, bowel sounds present.  Extremities: No pitting edema, distal pulses 2+.  Skin: Warm and dry.   Problem List and Plan   Atrial fibrillation Paroxysmal to persistent. Continue strategy of heart  rate control and anticoagulation. ECG reviewed.  Pulmonary hypertension Has been followed in the CHF clinic, improvement noted over time and doing well. Last echocardiogram in August 2014 showed normal RV contraction with RVSP approximately 45 mm mercury.    Satira Sark, M.D., F.A.C.C.

## 2013-03-11 ENCOUNTER — Ambulatory Visit (INDEPENDENT_AMBULATORY_CARE_PROVIDER_SITE_OTHER): Payer: Medicare Other | Admitting: *Deleted

## 2013-03-11 DIAGNOSIS — Z5181 Encounter for therapeutic drug level monitoring: Secondary | ICD-10-CM | POA: Insufficient documentation

## 2013-03-11 DIAGNOSIS — I4891 Unspecified atrial fibrillation: Secondary | ICD-10-CM

## 2013-03-11 DIAGNOSIS — Z7901 Long term (current) use of anticoagulants: Secondary | ICD-10-CM

## 2013-03-11 LAB — POCT INR: INR: 1.8

## 2013-03-15 ENCOUNTER — Other Ambulatory Visit: Payer: Self-pay | Admitting: *Deleted

## 2013-03-15 MED ORDER — DILTIAZEM HCL ER COATED BEADS 240 MG PO CP24: 240.0000 mg | ORAL_CAPSULE | Freq: Every day | ORAL | Status: DC

## 2013-03-16 ENCOUNTER — Inpatient Hospital Stay (HOSPITAL_COMMUNITY): Admission: RE | Admit: 2013-03-16 | Payer: Medicare Other | Source: Ambulatory Visit

## 2013-04-01 ENCOUNTER — Ambulatory Visit (INDEPENDENT_AMBULATORY_CARE_PROVIDER_SITE_OTHER): Payer: Medicare Other | Admitting: *Deleted

## 2013-04-01 DIAGNOSIS — I4891 Unspecified atrial fibrillation: Secondary | ICD-10-CM

## 2013-04-01 DIAGNOSIS — Z5181 Encounter for therapeutic drug level monitoring: Secondary | ICD-10-CM

## 2013-04-01 LAB — POCT INR: INR: 2.5

## 2013-04-29 ENCOUNTER — Ambulatory Visit (INDEPENDENT_AMBULATORY_CARE_PROVIDER_SITE_OTHER): Payer: Medicare Other | Admitting: *Deleted

## 2013-04-29 DIAGNOSIS — I4891 Unspecified atrial fibrillation: Secondary | ICD-10-CM

## 2013-04-29 DIAGNOSIS — Z5181 Encounter for therapeutic drug level monitoring: Secondary | ICD-10-CM

## 2013-04-29 LAB — POCT INR: INR: 2.7

## 2013-05-27 ENCOUNTER — Ambulatory Visit (INDEPENDENT_AMBULATORY_CARE_PROVIDER_SITE_OTHER): Payer: Medicare Other | Admitting: *Deleted

## 2013-05-27 DIAGNOSIS — I4891 Unspecified atrial fibrillation: Secondary | ICD-10-CM

## 2013-05-27 DIAGNOSIS — Z5181 Encounter for therapeutic drug level monitoring: Secondary | ICD-10-CM

## 2013-05-27 LAB — POCT INR: INR: 2

## 2013-06-24 ENCOUNTER — Ambulatory Visit (INDEPENDENT_AMBULATORY_CARE_PROVIDER_SITE_OTHER): Payer: Medicare Other | Admitting: *Deleted

## 2013-06-24 DIAGNOSIS — Z5181 Encounter for therapeutic drug level monitoring: Secondary | ICD-10-CM

## 2013-06-24 DIAGNOSIS — I4891 Unspecified atrial fibrillation: Secondary | ICD-10-CM

## 2013-06-24 LAB — POCT INR: INR: 3.1

## 2013-07-22 ENCOUNTER — Ambulatory Visit (INDEPENDENT_AMBULATORY_CARE_PROVIDER_SITE_OTHER): Payer: Medicare Other | Admitting: *Deleted

## 2013-07-22 DIAGNOSIS — I4891 Unspecified atrial fibrillation: Secondary | ICD-10-CM

## 2013-07-22 DIAGNOSIS — Z5181 Encounter for therapeutic drug level monitoring: Secondary | ICD-10-CM

## 2013-07-22 LAB — POCT INR: INR: 7.3

## 2013-07-26 ENCOUNTER — Ambulatory Visit (INDEPENDENT_AMBULATORY_CARE_PROVIDER_SITE_OTHER): Payer: Medicare Other | Admitting: *Deleted

## 2013-07-26 DIAGNOSIS — I4891 Unspecified atrial fibrillation: Secondary | ICD-10-CM

## 2013-07-26 DIAGNOSIS — Z5181 Encounter for therapeutic drug level monitoring: Secondary | ICD-10-CM

## 2013-07-26 LAB — POCT INR: INR: 1.8

## 2013-08-16 ENCOUNTER — Ambulatory Visit (INDEPENDENT_AMBULATORY_CARE_PROVIDER_SITE_OTHER): Payer: Medicare Other | Admitting: *Deleted

## 2013-08-16 DIAGNOSIS — Z5181 Encounter for therapeutic drug level monitoring: Secondary | ICD-10-CM

## 2013-08-16 DIAGNOSIS — I4891 Unspecified atrial fibrillation: Secondary | ICD-10-CM

## 2013-08-16 LAB — POCT INR: INR: 2.1

## 2013-08-22 ENCOUNTER — Ambulatory Visit (INDEPENDENT_AMBULATORY_CARE_PROVIDER_SITE_OTHER): Payer: Medicare Other | Admitting: Cardiology

## 2013-08-22 ENCOUNTER — Encounter: Payer: Self-pay | Admitting: Cardiology

## 2013-08-22 VITALS — BP 115/79 | HR 83 | Ht 69.0 in | Wt 232.4 lb

## 2013-08-22 DIAGNOSIS — I272 Pulmonary hypertension, unspecified: Secondary | ICD-10-CM

## 2013-08-22 DIAGNOSIS — I2789 Other specified pulmonary heart diseases: Secondary | ICD-10-CM

## 2013-08-22 DIAGNOSIS — I4891 Unspecified atrial fibrillation: Secondary | ICD-10-CM

## 2013-08-22 DIAGNOSIS — I10 Essential (primary) hypertension: Secondary | ICD-10-CM

## 2013-08-22 DIAGNOSIS — I480 Paroxysmal atrial fibrillation: Secondary | ICD-10-CM

## 2013-08-22 MED ORDER — DILTIAZEM HCL ER COATED BEADS 240 MG PO CP24: 240.0000 mg | ORAL_CAPSULE | Freq: Every day | ORAL | Status: DC

## 2013-08-22 NOTE — Assessment & Plan Note (Signed)
Blood pressure is normal.

## 2013-08-22 NOTE — Progress Notes (Signed)
Clinical Summary Wanda [name] is a 73 y.o.female last seen in January. No interval followup in the CHF clinic. She has been doing quite well. Cares for her husband who remains in Hospice at home. She reports no progressive shortness of breath, no bleeding problems on Coumadin. She has not been hospitalized in the interim.  Echocardiogram from August 2014 showed moderate LVH with LVEF 60-65%, mild to moderate left atrial enlargement, normal RV size and contraction, RVSP approximately 45 mm mercury.   Allergies  Allergen Reactions  . Gluten   . Hydromorphone Hcl     REACTION: low bp  . Penicillins   . Prednisone     Current Outpatient Prescriptions  Medication Sig Dispense Refill  . ALPRAZolam (XANAX) 1 MG tablet Take 1 mg by mouth at bedtime.      Marland Kitchen aspirin EC 81 MG tablet Take 81 mg by mouth daily.      Marland Kitchen atorvastatin (LIPITOR) 10 MG tablet Take 10 mg by mouth every evening.       . diltiazem (CARDIZEM CD) 240 MG 24 hr capsule Take 1 capsule (240 mg total) by mouth daily.  30 capsule  6  . lisinopril (PRINIVIL,ZESTRIL) 20 MG tablet TAKE 1 TABLET DAILY  90 tablet  2  . spironolactone (ALDACTONE) 25 MG tablet Take 1 tablet (25 mg total) by mouth daily.  90 tablet  0  . trimethoprim (TRIMPEX) 100 MG tablet Take 100 mg by mouth daily.      Marland Kitchen warfarin (COUMADIN) 5 MG tablet take 1 and 1/2 tablets once daily or as directed PER COAGULATION CLINIC  135 tablet  0   No current facility-administered medications for this visit.    Past Medical History  Diagnosis Date  . Goiter   . Essential hypertension, benign   . Mixed hyperlipidemia   . Type 2 diabetes mellitus   . History of breast cancer     Status [name] mastectomy  . Celiac disease   . COPD (chronic obstructive pulmonary disease)   . Pulmonary hypertension   . PUD (peptic ulcer disease)   . History of cardiac catheterization     No significant CAD 2006  . Paroxysmal atrial fibrillation     Coumadin therapy    Social  History Wanda [name] reports that she quit smoking about 18 years ago. Her smoking use included Cigarettes. She smoked 0.00 packs per day. She has never used smokeless tobacco. Wanda [name] reports that she does not drink alcohol.  Review of Systems No chest pain, no cough or hemoptysis, no orthopnea or PND. Other systems reviewed and negative except as outlined.  Physical Examination Filed Vitals:   08/22/13 1539  BP: 115/79  Pulse: 83   Filed Weights   08/22/13 1539  Weight: 232 lb 6.4 oz (105.416 kg)    No acute distress.  HEENT: Conjunctiva and lids normal, oropharynx clear.  Neck: Supple, no elevated JVP or carotid bruits, no thyromegaly.  Lungs: Clear to auscultation, diminished, nonlabored breathing at rest.  Cardiac: Irregularly irregular, distant, no S3.  Abdomen: Soft, nontender, bowel sounds present.  Extremities: No pitting edema, distal pulses 2+.  Skin: Warm and dry.   Problem List and Plan   Pulmonary hypertension Last echocardiogram in August 2014 showed normal RV contraction with RVSP approximately 45 mm mercury. She has been doing quite well, symptomatically stable. Her weight is up, not all clearly fluid however. Continue current regimen for now. We will hold off followup echocardiogram at this time.  Essential hypertension, benign Blood pressure is normal.  Atrial fibrillation Continue Cardizem CD and Coumadin.    Satira Sark, M.D., F.A.C.C.

## 2013-08-22 NOTE — Assessment & Plan Note (Signed)
Continue Cardizem CD and Coumadin.

## 2013-08-22 NOTE — Assessment & Plan Note (Signed)
Last echocardiogram in August 2014 showed normal RV contraction with RVSP approximately 45 mm mercury. She has been doing quite well, symptomatically stable. Her weight is up, not all clearly fluid however. Continue current regimen for now. We will hold off followup echocardiogram at this time.

## 2013-08-22 NOTE — Patient Instructions (Signed)

## 2013-09-06 ENCOUNTER — Ambulatory Visit (INDEPENDENT_AMBULATORY_CARE_PROVIDER_SITE_OTHER): Payer: Medicare Other | Admitting: *Deleted

## 2013-09-06 DIAGNOSIS — Z5181 Encounter for therapeutic drug level monitoring: Secondary | ICD-10-CM

## 2013-09-06 DIAGNOSIS — I4891 Unspecified atrial fibrillation: Secondary | ICD-10-CM

## 2013-09-06 LAB — POCT INR: INR: 1.7

## 2013-09-27 ENCOUNTER — Ambulatory Visit (INDEPENDENT_AMBULATORY_CARE_PROVIDER_SITE_OTHER): Payer: Medicare Other | Admitting: *Deleted

## 2013-09-27 DIAGNOSIS — Z5181 Encounter for therapeutic drug level monitoring: Secondary | ICD-10-CM

## 2013-09-27 DIAGNOSIS — I4891 Unspecified atrial fibrillation: Secondary | ICD-10-CM

## 2013-09-27 LAB — POCT INR: INR: 2.5

## 2013-10-14 ENCOUNTER — Ambulatory Visit (INDEPENDENT_AMBULATORY_CARE_PROVIDER_SITE_OTHER): Payer: Medicare Other | Admitting: Pharmacist

## 2013-10-14 DIAGNOSIS — I4891 Unspecified atrial fibrillation: Secondary | ICD-10-CM

## 2013-10-14 DIAGNOSIS — Z5181 Encounter for therapeutic drug level monitoring: Secondary | ICD-10-CM

## 2013-10-14 LAB — POCT INR: INR: 3

## 2013-11-11 ENCOUNTER — Ambulatory Visit (INDEPENDENT_AMBULATORY_CARE_PROVIDER_SITE_OTHER): Payer: Medicare Other | Admitting: *Deleted

## 2013-11-11 DIAGNOSIS — Z5181 Encounter for therapeutic drug level monitoring: Secondary | ICD-10-CM

## 2013-11-11 DIAGNOSIS — I4891 Unspecified atrial fibrillation: Secondary | ICD-10-CM

## 2013-11-11 LAB — POCT INR: INR: 5.4

## 2013-11-18 ENCOUNTER — Ambulatory Visit (INDEPENDENT_AMBULATORY_CARE_PROVIDER_SITE_OTHER): Payer: Medicare Other

## 2013-11-18 DIAGNOSIS — Z5181 Encounter for therapeutic drug level monitoring: Secondary | ICD-10-CM

## 2013-11-18 DIAGNOSIS — I4891 Unspecified atrial fibrillation: Secondary | ICD-10-CM

## 2013-11-18 LAB — POCT INR: INR: 3

## 2013-12-01 ENCOUNTER — Ambulatory Visit (INDEPENDENT_AMBULATORY_CARE_PROVIDER_SITE_OTHER): Payer: Medicare Other | Admitting: *Deleted

## 2013-12-01 DIAGNOSIS — Z5181 Encounter for therapeutic drug level monitoring: Secondary | ICD-10-CM

## 2013-12-01 DIAGNOSIS — I4891 Unspecified atrial fibrillation: Secondary | ICD-10-CM

## 2013-12-01 LAB — POCT INR: INR: 4.1

## 2013-12-15 ENCOUNTER — Ambulatory Visit (INDEPENDENT_AMBULATORY_CARE_PROVIDER_SITE_OTHER): Payer: Medicare Other | Admitting: *Deleted

## 2013-12-15 DIAGNOSIS — Z5181 Encounter for therapeutic drug level monitoring: Secondary | ICD-10-CM

## 2013-12-15 DIAGNOSIS — I4891 Unspecified atrial fibrillation: Secondary | ICD-10-CM

## 2013-12-15 LAB — POCT INR: INR: 2.8

## 2014-01-17 ENCOUNTER — Ambulatory Visit (INDEPENDENT_AMBULATORY_CARE_PROVIDER_SITE_OTHER): Payer: Medicare Other | Admitting: *Deleted

## 2014-01-17 DIAGNOSIS — I4891 Unspecified atrial fibrillation: Secondary | ICD-10-CM

## 2014-01-17 DIAGNOSIS — Z5181 Encounter for therapeutic drug level monitoring: Secondary | ICD-10-CM

## 2014-01-17 LAB — POCT INR: INR: 2.5

## 2014-02-14 ENCOUNTER — Ambulatory Visit (INDEPENDENT_AMBULATORY_CARE_PROVIDER_SITE_OTHER): Payer: Medicare Other | Admitting: *Deleted

## 2014-02-14 DIAGNOSIS — Z5181 Encounter for therapeutic drug level monitoring: Secondary | ICD-10-CM

## 2014-02-14 DIAGNOSIS — I4891 Unspecified atrial fibrillation: Secondary | ICD-10-CM

## 2014-02-14 LAB — POCT INR: INR: 2.9

## 2014-03-21 ENCOUNTER — Ambulatory Visit (INDEPENDENT_AMBULATORY_CARE_PROVIDER_SITE_OTHER): Payer: Medicare Other | Admitting: *Deleted

## 2014-03-21 DIAGNOSIS — Z5181 Encounter for therapeutic drug level monitoring: Secondary | ICD-10-CM

## 2014-03-21 DIAGNOSIS — I4891 Unspecified atrial fibrillation: Secondary | ICD-10-CM

## 2014-03-21 LAB — POCT INR: INR: 2.2

## 2014-04-12 ENCOUNTER — Other Ambulatory Visit: Payer: Self-pay | Admitting: Cardiology

## 2014-05-02 ENCOUNTER — Ambulatory Visit (INDEPENDENT_AMBULATORY_CARE_PROVIDER_SITE_OTHER): Payer: Medicare Other | Admitting: *Deleted

## 2014-05-02 DIAGNOSIS — Z5181 Encounter for therapeutic drug level monitoring: Secondary | ICD-10-CM

## 2014-05-02 DIAGNOSIS — I4891 Unspecified atrial fibrillation: Secondary | ICD-10-CM | POA: Diagnosis not present

## 2014-05-02 LAB — POCT INR: INR: 2.4

## 2014-05-23 ENCOUNTER — Ambulatory Visit (INDEPENDENT_AMBULATORY_CARE_PROVIDER_SITE_OTHER): Payer: Medicare Other | Admitting: *Deleted

## 2014-05-23 DIAGNOSIS — I480 Paroxysmal atrial fibrillation: Secondary | ICD-10-CM

## 2014-05-23 DIAGNOSIS — I4891 Unspecified atrial fibrillation: Secondary | ICD-10-CM

## 2014-05-23 DIAGNOSIS — Z5181 Encounter for therapeutic drug level monitoring: Secondary | ICD-10-CM | POA: Diagnosis not present

## 2014-05-23 LAB — POCT INR: INR: 1.2

## 2014-06-13 ENCOUNTER — Ambulatory Visit (INDEPENDENT_AMBULATORY_CARE_PROVIDER_SITE_OTHER): Payer: Medicare Other | Admitting: *Deleted

## 2014-06-13 DIAGNOSIS — Z5181 Encounter for therapeutic drug level monitoring: Secondary | ICD-10-CM | POA: Diagnosis not present

## 2014-06-13 DIAGNOSIS — I4891 Unspecified atrial fibrillation: Secondary | ICD-10-CM

## 2014-06-13 LAB — POCT INR: INR: 2.3

## 2014-06-14 ENCOUNTER — Other Ambulatory Visit: Payer: Self-pay | Admitting: Cardiology

## 2014-07-11 ENCOUNTER — Ambulatory Visit (INDEPENDENT_AMBULATORY_CARE_PROVIDER_SITE_OTHER): Payer: Medicare Other | Admitting: *Deleted

## 2014-07-11 ENCOUNTER — Ambulatory Visit (INDEPENDENT_AMBULATORY_CARE_PROVIDER_SITE_OTHER): Payer: Medicare Other | Admitting: Cardiology

## 2014-07-11 ENCOUNTER — Encounter: Payer: Self-pay | Admitting: Cardiology

## 2014-07-11 VITALS — BP 118/78 | HR 88 | Ht 68.0 in | Wt 219.1 lb

## 2014-07-11 DIAGNOSIS — Z5181 Encounter for therapeutic drug level monitoring: Secondary | ICD-10-CM

## 2014-07-11 DIAGNOSIS — I270 Primary pulmonary hypertension: Secondary | ICD-10-CM | POA: Diagnosis not present

## 2014-07-11 DIAGNOSIS — I481 Persistent atrial fibrillation: Secondary | ICD-10-CM | POA: Diagnosis not present

## 2014-07-11 DIAGNOSIS — I272 Pulmonary hypertension, unspecified: Secondary | ICD-10-CM

## 2014-07-11 DIAGNOSIS — I4819 Other persistent atrial fibrillation: Secondary | ICD-10-CM

## 2014-07-11 DIAGNOSIS — I4891 Unspecified atrial fibrillation: Secondary | ICD-10-CM

## 2014-07-11 LAB — POCT INR: INR: 5

## 2014-07-11 NOTE — Progress Notes (Signed)
Cardiology Office Note  Date: 07/11/2014   ID: Wanda [name], DOB [birth year], MRN [id number]  PCP: Glenda Chroman., MD  Primary Cardiologist: Rozann Lesches, MD   Chief Complaint  Patient presents with  . PAF  . Pulmonary hypertension    History of Present Illness: Wanda [name] is a 74 y.o. female last seen in July 2015. She presents for a routine follow-up visit. Since I last saw her, her husband has passed away. She states that she is moving into a smaller home, stays active with her grandchildren. She does not report any worsening shortness of breath, no palpitations. We reviewed her medications which are outlined below.  She continues to follow in the anticoagulation clinic on Coumadin. She denies any significant bleeding problems.  ECG done today is stable, reviewed below.  We discussed her most recent echocardiogram done within the last 2 years. For now she prefer to hold off on follow-up testing, reporting no change in symptoms.   Past Medical History  Diagnosis Date  . Goiter   . Essential hypertension, benign   . Mixed hyperlipidemia   . Type 2 diabetes mellitus   . History of breast cancer     Status post mastectomy  . Celiac disease   . COPD (chronic obstructive pulmonary disease)   . Pulmonary hypertension   . PUD (peptic ulcer disease)   . History of cardiac catheterization     No significant CAD 2006  . Paroxysmal atrial fibrillation     Coumadin therapy    Current Outpatient Prescriptions  Medication Sig Dispense Refill  . ALPRAZolam (XANAX) 1 MG tablet Take 1 mg by mouth 2 (two) times daily as needed.     Marland Kitchen aspirin EC 81 MG tablet Take 81 mg by mouth daily.    Marland Kitchen atorvastatin (LIPITOR) 10 MG tablet Take 10 mg by mouth every evening.     . diltiazem (CARDIZEM CD) 240 MG 24 hr capsule TAKE ONE CAPSULE BY MOUTH DAILY 30 capsule 6  . lisinopril (PRINIVIL,ZESTRIL) 20 MG tablet TAKE 1 TABLET DAILY 90 tablet 2  . spironolactone (ALDACTONE) 25 MG tablet  Take 1 tablet (25 mg total) by mouth daily. 90 tablet 0  . trimethoprim (TRIMPEX) 100 MG tablet Take 100 mg by mouth daily.    Marland Kitchen warfarin (COUMADIN) 5 MG tablet take 1 and 1/2 tablets once daily or as directed PER COAGULATION CLINIC 135 tablet 0   No current facility-administered medications for this visit.    Allergies:  Gluten; Hydromorphone hcl; Penicillins; and Prednisone   Social History: The patient  reports that she quit smoking about 19 years ago. Her smoking use included Cigarettes. She has never used smokeless tobacco. She reports that she does not drink alcohol or use illicit drugs.   ROS:  Please see the history of present illness. Otherwise, complete review of systems is positive for none.  All other systems are reviewed and negative.   Physical Exam: VS:  BP 118/78 mmHg  Pulse 88  Ht _0  (1.727 m)  Wt 219 lb 1.9 oz (99.392 kg)  BMI 33.32 kg/m2  SpO2 92%, BMI Body mass index is 33.32 kg/(m^2).  Wt Readings from Last 3 Encounters:  07/11/14 219 lb 1.9 oz (99.392 kg)  08/22/13 232 lb 6.4 oz (105.416 kg)  02/17/13 216 lb 1.9 oz (98.031 kg)     No acute distress.  HEENT: Conjunctiva and lids normal, oropharynx clear.  Neck: Supple, no elevated JVP or carotid bruits, no thyromegaly.  Lungs:  Clear to auscultation, diminished, nonlabored breathing at rest.  Cardiac: Irregularly irregular, distant, no S3.  Abdomen: Soft, nontender, bowel sounds present.  Extremities: No pitting edema, distal pulses 2+.  Skin: Warm and dry.   ECG: ECG is ordered today and shows rate-controlled atrial fibrillation with nonspecific T-wave changes.   Other Studies Reviewed Today:  Echocardiogram from August 2014 showed moderate LVH with LVEF 60-65%, mild to moderate left atrial enlargement, normal RV size and contraction, RVSP approximately 45 mm mercury.  Assessment and Plan:  1. Persistent atrial fibrillation, no change in current regimen including Cardizem CD for rate  control, and Coumadin.  2. History of pulmonary hypertension, improved based on last echocardiogram with RVSP 45 mmHg. She remained symptomatically stable, and we will continue observation for now.  Current medicines were reviewed with the patient today.   Orders Placed This Encounter  Procedures  . EKG 12-Lead    Disposition: FU with me in 6 months.   Signed, Satira Sark, MD, [facility] 07/11/2014 11:47 AM    [facility] [place], [place], [place] [address] Phone: [phone]; Fax: [phone]

## 2014-07-11 NOTE — Patient Instructions (Signed)
Your physician recommends that you continue on your current medications as directed. Please refer to the Current Medication list given to you today. Your physician recommends that you schedule a follow-up appointment in: 6 months. You will receive a reminder letter in the mail in about 4 months reminding you to call and schedule your appointment. If you don't receive this letter, please contact our office.

## 2014-07-13 ENCOUNTER — Ambulatory Visit (INDEPENDENT_AMBULATORY_CARE_PROVIDER_SITE_OTHER): Payer: Medicare Other | Admitting: *Deleted

## 2014-07-13 DIAGNOSIS — I4891 Unspecified atrial fibrillation: Secondary | ICD-10-CM | POA: Diagnosis not present

## 2014-07-13 DIAGNOSIS — Z5181 Encounter for therapeutic drug level monitoring: Secondary | ICD-10-CM | POA: Diagnosis not present

## 2014-07-13 LAB — POCT INR: INR: 2.1

## 2014-07-20 ENCOUNTER — Ambulatory Visit (INDEPENDENT_AMBULATORY_CARE_PROVIDER_SITE_OTHER): Payer: Medicare Other | Admitting: *Deleted

## 2014-07-20 DIAGNOSIS — I4891 Unspecified atrial fibrillation: Secondary | ICD-10-CM | POA: Diagnosis not present

## 2014-07-20 DIAGNOSIS — Z5181 Encounter for therapeutic drug level monitoring: Secondary | ICD-10-CM

## 2014-07-20 LAB — POCT INR: INR: 2.4

## 2014-08-10 ENCOUNTER — Ambulatory Visit (INDEPENDENT_AMBULATORY_CARE_PROVIDER_SITE_OTHER): Payer: Medicare Other | Admitting: *Deleted

## 2014-08-10 DIAGNOSIS — I4891 Unspecified atrial fibrillation: Secondary | ICD-10-CM | POA: Diagnosis not present

## 2014-08-10 DIAGNOSIS — Z5181 Encounter for therapeutic drug level monitoring: Secondary | ICD-10-CM

## 2014-08-10 LAB — POCT INR: INR: 1.7

## 2014-08-28 ENCOUNTER — Emergency Department (HOSPITAL_COMMUNITY): Payer: Medicare Other

## 2014-08-28 ENCOUNTER — Encounter (HOSPITAL_COMMUNITY): Payer: Self-pay | Admitting: *Deleted

## 2014-08-28 ENCOUNTER — Emergency Department (HOSPITAL_COMMUNITY)
Admission: EM | Admit: 2014-08-28 | Discharge: 2014-08-28 | Disposition: A | Discharge status: Home / Self Care | Payer: Medicare Other | Attending: Emergency Medicine | Admitting: Emergency Medicine

## 2014-08-28 DIAGNOSIS — I10 Essential (primary) hypertension: Secondary | ICD-10-CM | POA: Insufficient documentation

## 2014-08-28 DIAGNOSIS — E782 Mixed hyperlipidemia: Secondary | ICD-10-CM | POA: Insufficient documentation

## 2014-08-28 DIAGNOSIS — R002 Palpitations: Secondary | ICD-10-CM | POA: Diagnosis not present

## 2014-08-28 DIAGNOSIS — Z8711 Personal history of peptic ulcer disease: Secondary | ICD-10-CM | POA: Insufficient documentation

## 2014-08-28 DIAGNOSIS — J449 Chronic obstructive pulmonary disease, unspecified: Secondary | ICD-10-CM | POA: Insufficient documentation

## 2014-08-28 DIAGNOSIS — Z9889 Other specified postprocedural states: Secondary | ICD-10-CM | POA: Insufficient documentation

## 2014-08-28 DIAGNOSIS — Z7982 Long term (current) use of aspirin: Secondary | ICD-10-CM | POA: Diagnosis not present

## 2014-08-28 DIAGNOSIS — Z792 Long term (current) use of antibiotics: Secondary | ICD-10-CM | POA: Diagnosis not present

## 2014-08-28 DIAGNOSIS — E119 Type 2 diabetes mellitus without complications: Secondary | ICD-10-CM | POA: Insufficient documentation

## 2014-08-28 DIAGNOSIS — Z7901 Long term (current) use of anticoagulants: Secondary | ICD-10-CM | POA: Diagnosis not present

## 2014-08-28 DIAGNOSIS — Z853 Personal history of malignant neoplasm of breast: Secondary | ICD-10-CM | POA: Insufficient documentation

## 2014-08-28 DIAGNOSIS — Z87891 Personal history of nicotine dependence: Secondary | ICD-10-CM | POA: Diagnosis not present

## 2014-08-28 LAB — BASIC METABOLIC PANEL WITH GFR
Anion gap: 7 (ref 5–15)
BUN: 34 mg/dL — ABNORMAL HIGH (ref 6–20)
CO2: 25 mmol/L (ref 22–32)
Calcium: 8.4 mg/dL — ABNORMAL LOW (ref 8.9–10.3)
Chloride: 105 mmol/L (ref 101–111)
Creatinine, Ser: 1.98 mg/dL — ABNORMAL HIGH (ref 0.44–1.00)
GFR calc Af Amer: 27 mL/min — ABNORMAL LOW (ref >60)
GFR calc non Af Amer: 24 mL/min — ABNORMAL LOW (ref >60)
Glucose, Bld: 118 mg/dL — ABNORMAL HIGH (ref 65–99)
Potassium: 4.3 mmol/L (ref 3.5–5.1)
Sodium: 137 mmol/L (ref 135–145)

## 2014-08-28 LAB — CBC
HCT: 38.7 % (ref 36.0–46.0)
HEMOGLOBIN: 12.7 g/dL (ref 12.0–15.0)
MCH: 30.3 pg (ref 26.0–34.0)
MCHC: 32.8 g/dL (ref 30.0–36.0)
MCV: 92.4 fL (ref 78.0–100.0)
PLATELETS: 302 10*3/uL (ref 150–400)
RBC: 4.19 MIL/uL (ref 3.87–5.11)
RDW: 13.8 % (ref 11.5–15.5)
WBC: 11.2 10*3/uL — AB (ref 4.0–10.5)

## 2014-08-28 LAB — PROTIME-INR
INR: 1.17 (ref 0.00–1.49)
PROTHROMBIN TIME: 15.1 s (ref 11.6–15.2)

## 2014-08-28 LAB — TROPONIN I: Troponin I: <0.03 ng/mL (ref <0.031)

## 2014-08-28 LAB — MAGNESIUM: MAGNESIUM: 1.6 mg/dL — AB (ref 1.7–2.4)

## 2014-08-28 MED ORDER — SODIUM CHLORIDE 0.9 % IV BOLUS (SEPSIS)
500.0000 mL | Freq: Once | INTRAVENOUS | Status: AC
  Administered 2014-08-28: 500 mL via INTRAVENOUS

## 2014-08-28 MED ORDER — MAGNESIUM SULFATE 2 GM/50ML IV SOLN
2.0000 g | Freq: Once | INTRAVENOUS | Status: AC
  Administered 2014-08-28: 2 g via INTRAVENOUS
  Filled 2014-08-28: qty 50

## 2014-08-28 NOTE — ED Notes (Signed)
Patient reports hx of a. Fib. States she hasn't "felt right all day" and c/o of weakness.

## 2014-08-28 NOTE — ED Provider Notes (Addendum)
CSN: [id number]     Arrival date & time 08/28/14  1627 History   First MD Initiated Contact with Patient 08/28/14 1821     Chief Complaint  Patient presents with  . Irregular Heart Beat     (Consider location/radiation/quality/duration/timing/severity/associated sxs/prior Treatment) HPI Comments: Patient presents to the ER for evaluation of palpitations. Patient reports that she has been feeling weak and like her heart is fluttering intermittently all day. She has noticed that she is having PVCs at home. She does have a history of atrial fibrillation. Patient is not experiencing any chest pain. She did notice that she had muscle cramping overnight last night while trying to sleep.   Past Medical History  Diagnosis Date  . Essential hypertension, benign   . Mixed hyperlipidemia   . Type 2 diabetes mellitus   . History of breast cancer     Status post mastectomy  . Celiac disease   . COPD (chronic obstructive pulmonary disease)   . Pulmonary hypertension   . PUD (peptic ulcer disease)   . History of cardiac catheterization     No significant CAD 2006  . Paroxysmal atrial fibrillation     Coumadin therapy  . Goiter    Past Surgical History  Procedure Laterality Date  . Mastectomy    . Dilation and curettage of uterus    . Hysteroscopy w/ endometrial ablation      [employer]   History reviewed. No pertinent family history. History  Substance Use Topics  . Smoking status: Former Smoker    Types: Cigarettes    Quit date: 02/11/1995  . Smokeless tobacco: Never Used  . Alcohol Use: 0.0 oz/week    0 Standard drinks or equivalent per week     Comment: occ   OB History    No data available     Review of Systems  Constitutional: Positive for fatigue.  Respiratory: Negative for shortness of breath.   Cardiovascular: Positive for palpitations.  Musculoskeletal:       Muscle cramps  All other systems reviewed and are negative.     Allergies  Gluten; Hydromorphone  hcl; and Prednisone  Home Medications   Prior to Admission medications   Medication Sig Start Date End Date Taking? Authorizing Provider  ALPRAZolam Duanne Moron) 1 MG tablet Take 1 mg by mouth 2 (two) times daily as needed for anxiety or sleep.    Yes Historical Provider, MD  aspirin EC 81 MG tablet Take 81 mg by mouth every evening.    Yes Historical Provider, MD  atorvastatin (LIPITOR) 10 MG tablet Take 10 mg by mouth every evening.    Yes Historical Provider, MD  chlorthalidone (HYGROTON) 25 MG tablet Take 25 mg by mouth daily. 08/04/14  Yes Historical Provider, MD  ciprofloxacin (CIPRO) 500 MG tablet Take 500 mg by mouth 2 (two) times daily. [place] ON 08/18/2014 08/18/14  Yes Historical Provider, MD  citalopram (CELEXA) 10 MG tablet Take 10 mg by mouth at bedtime. 08/12/14  Yes Historical Provider, MD  colchicine 0.6 MG tablet Take 0.6 mg by mouth daily as needed (FOR GOUT RELIEF).  07/11/14  Yes Historical Provider, MD  diltiazem (CARDIZEM CD) 240 MG 24 hr capsule TAKE ONE CAPSULE BY MOUTH DAILY 06/14/14  Yes Satira Sark, MD  JANUMET 50-500 MG per tablet Take 1 tablet by mouth 2 (two) times daily. 08/25/14  Yes Historical Provider, MD  lisinopril (PRINIVIL,ZESTRIL) 20 MG tablet TAKE 1 TABLET DAILY 09/20/12  Yes Satira Sark,  MD  sennosides-docusate sodium (SENOKOT-S) 8.6-50 MG tablet Take 1 tablet by mouth daily as needed for constipation.   Yes Historical Provider, MD  spironolactone (ALDACTONE) 25 MG tablet Take 1 tablet (25 mg total) by mouth daily. 09/23/10  Yes Ezra Sites, MD  tamsulosin (FLOMAX) 0.4 MG CAPS capsule Take 0.4 mg by mouth daily. TAKE 30 MINUTES AFTER THE SAME MEAL EACH DAY 08/25/14  Yes Historical Provider, MD  trimethoprim (TRIMPEX) 100 MG tablet Take 100 mg by mouth daily.   Yes Historical Provider, MD  warfarin (COUMADIN) 5 MG tablet take 1 and 1/2 tablets once daily or as directed [place] Patient taking differently: Take 5 mg by mouth daily.   07/20/12  Yes Satira Sark, MD   BP 108/86 mmHg  Pulse 83  Temp(Src) 98.4 F (36.9 C) (Oral)  Resp 12  Ht _0  (1.727 m)  Wt 218 lb (98.884 kg)  BMI 33.15 kg/m2  SpO2 97% Physical Exam  Constitutional: She is oriented to person, place, and time. She appears well-developed and well-nourished. No distress.  HENT:  Head: Normocephalic and atraumatic.  Right Ear: Hearing normal.  Left Ear: Hearing normal.  Nose: Nose normal.  Mouth/Throat: Oropharynx is clear and moist and mucous membranes are normal.  Eyes: Conjunctivae and EOM are normal. Pupils are equal, round, and reactive to light.  Neck: Normal range of motion. Neck supple.  Cardiovascular: S1 normal and S2 normal.  An irregularly irregular rhythm present. Frequent extrasystoles are present. Exam reveals no gallop and no friction rub.   No murmur heard. Pulmonary/Chest: Effort normal and breath sounds normal. No respiratory distress. She exhibits no tenderness.  Abdominal: Soft. Normal appearance and bowel sounds are normal. There is no hepatosplenomegaly. There is no tenderness. There is no rebound, no guarding, no tenderness at McBurney's point and negative Murphy's sign. No hernia.  Musculoskeletal: Normal range of motion.  Neurological: She is alert and oriented to person, place, and time. She has normal strength. No cranial nerve deficit or sensory deficit. Coordination normal. GCS eye subscore is 4. GCS verbal subscore is 5. GCS motor subscore is 6.  Skin: Skin is warm, dry and intact. No rash noted. No cyanosis.  Psychiatric: She has a normal mood and affect. Her speech is normal and behavior is normal. Thought content normal.  Nursing note and vitals reviewed.   ED Course  Procedures (including critical care time) Labs Review Labs Reviewed  BASIC METABOLIC PANEL - Abnormal; Notable for the following:    Glucose, Bld 118 (*)    BUN 34 (*)    Creatinine, Ser 1.98 (*)    Calcium 8.4 (*)    GFR calc non Af Amer  24 (*)    GFR calc Af Amer 27 (*)    All other components within normal limits  CBC - Abnormal; Notable for the following:    WBC 11.2 (*)    All other components within normal limits  MAGNESIUM - Abnormal; Notable for the following:    Magnesium 1.6 (*)    All other components within normal limits  TROPONIN I  PROTIME-INR    Imaging Review Dg Chest 2 View  08/28/2014   CLINICAL DATA:  Chest pain and fatigue  EXAM: CHEST  2 VIEW  COMPARISON:  07/01/2010  FINDINGS: Normal heart size and stable mild aortic tortuosity. Chronic mild hyperinflation. No acute infiltrate or edema. No effusion or pneumothorax. Posttraumatic or developmental deformity of the right first and second ribs. No  acute osseous findings.  IMPRESSION: No active cardiopulmonary disease.   Electronically Signed   By: Monte Fantasia M.D.   On: 08/28/2014 17:01     EKG Interpretation   Date/Time:  Monday August 28 2014 16:36:57 EDT Ventricular Rate:  88 PR Interval:    QRS Duration: 78 QT Interval:  368 QTC Calculation: 445 R Axis:   77 Text Interpretation:  Atrial fibrillation with premature ventricular or  aberrantly conducted complexes Low voltage QRS Septal infarct , age  undetermined Abnormal ECG Confirmed by Jeneen Rinks  MD, [place] ([id number]) on  08/28/2014 5:25:07 PM      MDM   Final diagnoses:  Palpitations  Hypomagnesemia    Patient presents to the emergency department for evaluation of her palpitations, generalized weakness and muscle cramping. EKG and monitor shows frequent PVCs, no other acute abnormality. She has chronic atrial fibrillation, is rate controlled. She is subtherapeutic with her Coumadin will need to increase her dose and follow-up with her doctor for further instructions. There is no chest pain, shortness of breath, no stroke symptoms.  Patient complaining of cramping but potassium is normal. Magnesium was checked and is low. This is likely the cause of her PVCs as well as muscle cramping. She was  given IV magnesium here in the ER as well as IV fluid hydration. She is appropriate for discharge, follow-up with her doctor.  INR is subtherapeutic. Patient will take 10 mg tonight and tomorrow, she has follow-up with her Coumadin clinic in 3 days.  Orpah Greek, MD 08/28/14 [place], MD 08/28/14 2133

## 2014-08-28 NOTE — Discharge Instructions (Signed)
Take 10 mg of Coumadin tonight and tomorrow night, then resume your normal dosing.  Hypomagnesemia Magnesium is a common ion (mineral) in the body which is needed for metabolism. It is about how the body handles food and other chemical reactions necessary for life. Only about 2% of the magnesium in our body is found in the blood. When this is low, it is called hypomagnesemia. The blood will measure only a tiny amount of the magnesium in our body. When it is low in our blood, it does not mean that the whole body supply is low. The normal serum concentration ranges from 1.8-2.5 mEq/L. When the level gets to be less than 1.0 mEq/L, a number of problems begin to happen.  CAUSES   Receiving intravenous fluids without magnesium replacement.  Loss of magnesium from the bowel by nasogastric suction.  Loss of magnesium from nausea and vomiting or severe diarrhea. Any of the inflammatory bowel conditions can cause this.  Abuse of alcohol often leads to low serum magnesium.  An inherited form of magnesium loss happens when the kidneys lose magnesium. This is called familial or primary hypomagnesemia.  Some medications such as diuretics also cause the loss of magnesium. SYMPTOMS  These following problems are worse if the changes in magnesium levels come on suddenly.  Tremor.  Confusion.  Muscle weakness.  Oversensitive to sights and sounds.  Sensitive reflexes.  Depression.  Muscular fibrillations.  Overreactivity of the nerves.  Irritability.  Psychosis.  Spasms of the hand muscles.  Tetany (where the muscles go into uncontrollable spasms). DIAGNOSIS  This condition can be diagnosed by blood tests. TREATMENT   In an emergency, magnesium can be given intravenously (by vein).  If the condition is less worrisome, it can be corrected by diet. High levels of magnesium are found in green leafy vegetables, peas, beans, and nuts among other things. It can also be given through  medications by mouth.  If it is being caused by medications, changes can be made.  If alcohol is a problem, help is available if there are difficulties giving it up. Document Released: 10/23/2004 Document Revised: 06/13/2013 Document Reviewed: 09/17/2007 [facility] Patient Information 2015 [place], [place]. This information is not intended to replace advice given to you by your health care provider. Make sure you discuss any questions you have with your health care provider.  Palpitations A palpitation is the feeling that your heartbeat is irregular or is faster than normal. It may feel like your heart is fluttering or skipping a beat. Palpitations are usually not a serious problem. However, in some cases, you may need further medical evaluation. CAUSES  Palpitations can be caused by:  Smoking.  Caffeine or other stimulants, such as diet pills or energy drinks.  Alcohol.  Stress and anxiety.  Strenuous physical activity.  Fatigue.  Certain medicines.  Heart disease, especially if you have a history of irregular heart rhythms (arrhythmias), such as atrial fibrillation, atrial flutter, or supraventricular tachycardia.  An improperly working pacemaker or defibrillator. DIAGNOSIS  To find the cause of your palpitations, your health care provider will take your medical history and perform a physical exam. Your health care provider may also have you take a test called an ambulatory electrocardiogram (ECG). An ECG records your heartbeat patterns over a 24-hour period. You may also have other tests, such as:  Transthoracic echocardiogram (TTE). During echocardiography, sound waves are used to evaluate how blood flows through your heart.  Transesophageal echocardiogram (TEE).  Cardiac monitoring. This allows your health  care provider to monitor your heart rate and rhythm in real time.  Holter monitor. This is a portable device that records your heartbeat and can help diagnose heart  arrhythmias. It allows your health care provider to track your heart activity for several days, if needed.  Stress tests by exercise or by giving medicine that makes the heart beat faster. TREATMENT  Treatment of palpitations depends on the cause of your symptoms and can vary greatly. Most cases of palpitations do not require any treatment other than time, relaxation, and monitoring your symptoms. Other causes, such as atrial fibrillation, atrial flutter, or supraventricular tachycardia, usually require further treatment. HOME CARE INSTRUCTIONS   Avoid:  Caffeinated coffee, tea, soft drinks, diet pills, and energy drinks.  Chocolate.  Alcohol.  Stop smoking if you smoke.  Reduce your stress and anxiety. Things that can help you relax include:  A method of controlling things in your body, such as your heartbeats, with your mind (biofeedback).  Yoga.  Meditation.  Physical activity such as swimming, jogging, or walking.  Get plenty of rest and sleep. SEEK MEDICAL CARE IF:   You continue to have a fast or irregular heartbeat beyond 24 hours.  Your palpitations occur more often. SEEK IMMEDIATE MEDICAL CARE IF:  You have chest pain or shortness of breath.  You have a severe headache.  You feel dizzy or you faint. MAKE SURE YOU:  Understand these instructions.  Will watch your condition.  Will get help right away if you are not doing well or get worse. Document Released: 01/25/2000 Document Revised: 02/01/2013 Document Reviewed: 03/28/2011 [facility] Patient Information 2015 [place], [place]. This information is not intended to replace advice given to you by your health care provider. Make sure you discuss any questions you have with your health care provider.

## 2014-08-31 ENCOUNTER — Ambulatory Visit (INDEPENDENT_AMBULATORY_CARE_PROVIDER_SITE_OTHER): Payer: Medicare Other | Admitting: *Deleted

## 2014-08-31 DIAGNOSIS — Z5181 Encounter for therapeutic drug level monitoring: Secondary | ICD-10-CM

## 2014-08-31 DIAGNOSIS — I4891 Unspecified atrial fibrillation: Secondary | ICD-10-CM | POA: Diagnosis not present

## 2014-08-31 LAB — POCT INR: INR: 1.7

## 2014-09-01 ENCOUNTER — Ambulatory Visit (INDEPENDENT_AMBULATORY_CARE_PROVIDER_SITE_OTHER): Payer: Medicare Other | Admitting: Cardiology

## 2014-09-01 ENCOUNTER — Encounter: Payer: Self-pay | Admitting: Cardiology

## 2014-09-01 VITALS — BP 106/62 | HR 80 | Ht 68.0 in | Wt 218.0 lb

## 2014-09-01 DIAGNOSIS — I481 Persistent atrial fibrillation: Secondary | ICD-10-CM

## 2014-09-01 DIAGNOSIS — I10 Essential (primary) hypertension: Secondary | ICD-10-CM | POA: Diagnosis not present

## 2014-09-01 DIAGNOSIS — I493 Ventricular premature depolarization: Secondary | ICD-10-CM | POA: Diagnosis not present

## 2014-09-01 DIAGNOSIS — I4819 Other persistent atrial fibrillation: Secondary | ICD-10-CM

## 2014-09-01 NOTE — Progress Notes (Signed)
Cardiology Office Note  Date: 09/01/2014   ID: Wanda [name], DOB 02/27/1940, MRN [id number]  PCP: Glenda Chroman., MD  Primary Cardiologist: Rozann Lesches, MD   Chief Complaint  Patient presents with  . Atrial Fibrillation    History of Present Illness: Wanda [name] is a 74 y.o. female last seen in May at which time she was clinically stable. Record review finds recent ER evaluation by Dr. Betsey Holiday on July 18 secondary to palpitations and weakness. She was noted to have PVCs on baseline atrial fibrillation, had low magnesium level but normal potassium, also renal insufficiency with a creatinine of 1.9 up from 1.2. She was hydrated and given additional magnesium supplementation.  She comes in today for a follow-up visit, states that she feels much better. We reviewed her medications. She states that she has been trying to maintain better hydration with recent renal insufficiency. Entirely clear that she needs a long-standing magnesium supplement, but we did discuss this. She is on chlorthalidone.   Past Medical History  Diagnosis Date  . Essential hypertension, benign   . Mixed hyperlipidemia   . Type 2 diabetes mellitus   . History of breast cancer     Status post mastectomy  . Celiac disease   . COPD (chronic obstructive pulmonary disease)   . Pulmonary hypertension   . PUD (peptic ulcer disease)   . History of cardiac catheterization     No significant CAD 2006  . Paroxysmal atrial fibrillation     Coumadin therapy  . Goiter     Current Outpatient Prescriptions  Medication Sig Dispense Refill  . ALPRAZolam (XANAX) 1 MG tablet Take 1 mg by mouth 2 (two) times daily as needed for anxiety or sleep.     Marland Kitchen aspirin EC 81 MG tablet Take 81 mg by mouth every evening.     Marland Kitchen atorvastatin (LIPITOR) 10 MG tablet Take 10 mg by mouth every evening.     . chlorthalidone (HYGROTON) 25 MG tablet Take 25 mg by mouth daily.  3  . citalopram (CELEXA) 10 MG tablet Take 10 mg by mouth at  bedtime.  3  . colchicine 0.6 MG tablet Take 0.6 mg by mouth daily as needed (FOR GOUT RELIEF).   2  . diltiazem (CARDIZEM CD) 240 MG 24 hr capsule TAKE ONE CAPSULE BY MOUTH DAILY 30 capsule 6  . JANUMET 50-500 MG per tablet Take 1 tablet by mouth 2 (two) times daily.  3  . lisinopril (PRINIVIL,ZESTRIL) 20 MG tablet TAKE 1 TABLET DAILY 90 tablet 2  . sennosides-docusate sodium (SENOKOT-S) 8.6-50 MG tablet Take 1 tablet by mouth daily as needed for constipation.    Marland Kitchen spironolactone (ALDACTONE) 25 MG tablet Take 1 tablet (25 mg total) by mouth daily. 90 tablet 0  . tamsulosin (FLOMAX) 0.4 MG CAPS capsule Take 0.4 mg by mouth daily. TAKE 30 MINUTES AFTER THE SAME MEAL EACH DAY  2  . trimethoprim (TRIMPEX) 100 MG tablet Take 100 mg by mouth daily.    Marland Kitchen warfarin (COUMADIN) 5 MG tablet take 1 and 1/2 tablets once daily or as directed PER COAGULATION CLINIC (Patient taking differently: Take 5 mg by mouth daily. ) 135 tablet 0   No current facility-administered medications for this visit.    Allergies:  Gluten; Hydromorphone hcl; and Prednisone   Social History: The patient  reports that she quit smoking about 19 years ago. Her smoking use included Cigarettes. She has never used smokeless tobacco. She reports that she drinks alcohol.  She reports that she does not use illicit drugs.   ROS:  Please see the history of present illness. Otherwise, complete review of systems is positive for none.  All other systems are reviewed and negative.   Physical Exam: VS:  BP 106/62 mmHg  Pulse 80  Ht 5' 8" (1.727 m)  Wt 218 lb (98.884 kg)  BMI 33.15 kg/m2  SpO2 95%, BMI Body mass index is 33.15 kg/(m^2).  Wt Readings from Last 3 Encounters:  09/01/14 218 lb (98.884 kg)  08/28/14 218 lb (98.884 kg)  07/11/14 219 lb 1.9 oz (99.392 kg)     No acute distress.  HEENT: Conjunctiva and lids normal, oropharynx clear.  Neck: Supple, no elevated JVP or carotid bruits, no thyromegaly.  Lungs: Clear to  auscultation, diminished, nonlabored breathing at rest.  Cardiac: Irregularly irregular, distant, no S3.  Abdomen: Soft, nontender, bowel sounds present.  Extremities: No pitting edema, distal pulses 2+.  Skin: Warm and dry.   ECG: Tracing from 08/28/2014 showed rate-controlled atrial fibrillation with single PVC, borderline low voltage..   Recent Labwork: 08/28/2014: BUN 34*; Creatinine, Ser 1.98*; Hemoglobin 12.7; Magnesium 1.6*; Platelets 302; Potassium 4.3; Sodium 137   Assessment and Plan:  1. Recent PVCs, now asymptomatic. Noted with hypomagnesemia.  2. Chronic atrial fibrillation, continue current medications including Cardizem CD and Coumadin.  3. Essential hypertension, on lisinopril, chlorthalidone.  Current medicines were reviewed with the patient today.  Disposition: FU with me in 6 months.   Signed, Satira Sark, MD, [facility] 09/01/2014 10:30 AM    [facility] at [place], [place],  [address] Phone: [phone]; Fax: [phone]

## 2014-09-01 NOTE — Patient Instructions (Signed)
Continue all current medications. Your physician wants you to follow up in: 6 months.  You will receive a reminder letter in the mail one-two months in advance.  If you don't receive a letter, please call our office to schedule the follow up appointment   

## 2014-09-05 ENCOUNTER — Ambulatory Visit (INDEPENDENT_AMBULATORY_CARE_PROVIDER_SITE_OTHER): Payer: Medicare Other | Admitting: *Deleted

## 2014-09-05 DIAGNOSIS — I4891 Unspecified atrial fibrillation: Secondary | ICD-10-CM | POA: Diagnosis not present

## 2014-09-05 DIAGNOSIS — Z5181 Encounter for therapeutic drug level monitoring: Secondary | ICD-10-CM | POA: Diagnosis not present

## 2014-09-05 LAB — POCT INR: INR: 1.9

## 2014-09-21 ENCOUNTER — Ambulatory Visit (INDEPENDENT_AMBULATORY_CARE_PROVIDER_SITE_OTHER): Payer: Medicare Other | Admitting: *Deleted

## 2014-09-21 DIAGNOSIS — I4891 Unspecified atrial fibrillation: Secondary | ICD-10-CM | POA: Diagnosis not present

## 2014-09-21 DIAGNOSIS — Z5181 Encounter for therapeutic drug level monitoring: Secondary | ICD-10-CM

## 2014-09-21 LAB — POCT INR: INR: 2.3

## 2014-10-19 ENCOUNTER — Ambulatory Visit (INDEPENDENT_AMBULATORY_CARE_PROVIDER_SITE_OTHER): Payer: Medicare Other | Admitting: *Deleted

## 2014-10-19 DIAGNOSIS — I4891 Unspecified atrial fibrillation: Secondary | ICD-10-CM | POA: Diagnosis not present

## 2014-10-19 DIAGNOSIS — Z5181 Encounter for therapeutic drug level monitoring: Secondary | ICD-10-CM | POA: Diagnosis not present

## 2014-10-19 LAB — POCT INR: INR: 2.3

## 2014-11-28 ENCOUNTER — Ambulatory Visit (INDEPENDENT_AMBULATORY_CARE_PROVIDER_SITE_OTHER): Payer: Medicare Other | Admitting: *Deleted

## 2014-11-28 DIAGNOSIS — Z5181 Encounter for therapeutic drug level monitoring: Secondary | ICD-10-CM | POA: Diagnosis not present

## 2014-11-28 DIAGNOSIS — I4891 Unspecified atrial fibrillation: Secondary | ICD-10-CM

## 2014-11-28 LAB — POCT INR: INR: 1.6

## 2014-12-12 ENCOUNTER — Ambulatory Visit (INDEPENDENT_AMBULATORY_CARE_PROVIDER_SITE_OTHER): Payer: Medicare Other | Admitting: *Deleted

## 2014-12-12 DIAGNOSIS — I4891 Unspecified atrial fibrillation: Secondary | ICD-10-CM | POA: Diagnosis not present

## 2014-12-12 DIAGNOSIS — Z5181 Encounter for therapeutic drug level monitoring: Secondary | ICD-10-CM | POA: Diagnosis not present

## 2014-12-12 LAB — POCT INR: INR: 3.9

## 2014-12-19 ENCOUNTER — Ambulatory Visit (INDEPENDENT_AMBULATORY_CARE_PROVIDER_SITE_OTHER): Payer: Medicare Other | Admitting: *Deleted

## 2014-12-19 DIAGNOSIS — I4891 Unspecified atrial fibrillation: Secondary | ICD-10-CM

## 2014-12-19 DIAGNOSIS — Z5181 Encounter for therapeutic drug level monitoring: Secondary | ICD-10-CM | POA: Diagnosis not present

## 2014-12-19 LAB — POCT INR: INR: 3.2

## 2014-12-26 ENCOUNTER — Other Ambulatory Visit: Payer: Self-pay | Admitting: Cardiology

## 2015-01-09 ENCOUNTER — Ambulatory Visit (INDEPENDENT_AMBULATORY_CARE_PROVIDER_SITE_OTHER): Payer: Medicare Other | Admitting: *Deleted

## 2015-01-09 DIAGNOSIS — I4891 Unspecified atrial fibrillation: Secondary | ICD-10-CM | POA: Diagnosis not present

## 2015-01-09 DIAGNOSIS — Z5181 Encounter for therapeutic drug level monitoring: Secondary | ICD-10-CM | POA: Diagnosis not present

## 2015-01-09 LAB — POCT INR: INR: 3.9

## 2015-01-30 ENCOUNTER — Ambulatory Visit (INDEPENDENT_AMBULATORY_CARE_PROVIDER_SITE_OTHER): Payer: Medicare Other | Admitting: *Deleted

## 2015-01-30 DIAGNOSIS — Z5181 Encounter for therapeutic drug level monitoring: Secondary | ICD-10-CM

## 2015-01-30 DIAGNOSIS — I4891 Unspecified atrial fibrillation: Secondary | ICD-10-CM

## 2015-01-30 LAB — POCT INR: INR: 3.7

## 2015-02-13 ENCOUNTER — Ambulatory Visit (INDEPENDENT_AMBULATORY_CARE_PROVIDER_SITE_OTHER): Payer: PPO | Admitting: *Deleted

## 2015-02-13 DIAGNOSIS — Z5181 Encounter for therapeutic drug level monitoring: Secondary | ICD-10-CM

## 2015-02-13 DIAGNOSIS — I4891 Unspecified atrial fibrillation: Secondary | ICD-10-CM

## 2015-02-13 LAB — POCT INR: INR: 2.8

## 2015-03-01 ENCOUNTER — Ambulatory Visit (INDEPENDENT_AMBULATORY_CARE_PROVIDER_SITE_OTHER): Payer: PPO | Admitting: Cardiology

## 2015-03-01 ENCOUNTER — Encounter: Payer: Self-pay | Admitting: Cardiology

## 2015-03-01 VITALS — BP 100/62 | HR 95 | Ht 68.0 in | Wt 219.0 lb

## 2015-03-01 DIAGNOSIS — I272 Other secondary pulmonary hypertension: Secondary | ICD-10-CM | POA: Diagnosis not present

## 2015-03-01 DIAGNOSIS — I480 Paroxysmal atrial fibrillation: Secondary | ICD-10-CM

## 2015-03-01 DIAGNOSIS — I10 Essential (primary) hypertension: Secondary | ICD-10-CM

## 2015-03-01 NOTE — Patient Instructions (Addendum)
Your physician has recommended you make the following change in your medication:  Stop aspirin. Continue all other medications the same. Your physician recommends that you schedule a follow-up appointment in: 6 months. You will receive a reminder letter in the mail in about 4 months reminding you to call and schedule your appointment. If you don't receive this letter, please contact our office.

## 2015-03-01 NOTE — Progress Notes (Signed)
Cardiology Office Note  Date: 03/01/2015   ID: Wanda [name], DOB 02-23-1940, MRN [id number]  PCP: Glenda Chroman., MD  Primary Cardiologist: Rozann Lesches, MD   Chief Complaint  Patient presents with  . Atrial Fibrillation    History of Present Illness: Wanda [name] is a 75 y.o. female last seen in July 2016. She presents for a routine follow-up visit. She does not report any substantial change in symptoms, no decline in functional capacity or increasing palpitations. She has had no exertional chest pain, and reports stable NYHA class II dyspnea.  She continues on Coumadin with follow-up in the anticoagulation clinic. She reports no spontaneous bleeding problems. We reviewed her additional medications and I recommended that she come off of aspirin for now.  Past Medical History  Diagnosis Date  . Essential hypertension, benign   . Mixed hyperlipidemia   . Type 2 diabetes mellitus ([place])   . History of breast cancer     Status post mastectomy  . Celiac disease   . COPD (chronic obstructive pulmonary disease) ([place])   . Pulmonary hypertension ([place])   . PUD (peptic ulcer disease)   . History of cardiac catheterization     No significant CAD 2006  . Paroxysmal atrial fibrillation (HCC)     Coumadin therapy  . Goiter     Current Outpatient Prescriptions  Medication Sig Dispense Refill  . ALPRAZolam (XANAX) 1 MG tablet Take 1 mg by mouth 2 (two) times daily as needed for anxiety or sleep.     Marland Kitchen atorvastatin (LIPITOR) 10 MG tablet Take 10 mg by mouth every evening.     . chlorthalidone (HYGROTON) 25 MG tablet Take 25 mg by mouth daily.  3  . colchicine 0.6 MG tablet Take 0.6 mg by mouth daily as needed (FOR GOUT RELIEF).   2  . diltiazem (CARDIZEM CD) 240 MG 24 hr capsule TAKE ONE CAPSULE BY MOUTH DAILY 30 capsule 6  . JANUMET 50-500 MG per tablet Take 1 tablet by mouth 2 (two) times daily.  3  . lisinopril (PRINIVIL,ZESTRIL) 20 MG tablet TAKE 1 TABLET DAILY 90 tablet 2  .  sennosides-docusate sodium (SENOKOT-S) 8.6-50 MG tablet Take 1 tablet by mouth daily as needed for constipation.    Marland Kitchen spironolactone (ALDACTONE) 25 MG tablet Take 1 tablet (25 mg total) by mouth daily. 90 tablet 0  . tamsulosin (FLOMAX) 0.4 MG CAPS capsule Take 0.4 mg by mouth daily. TAKE 30 MINUTES AFTER THE SAME MEAL EACH DAY  2  . trimethoprim (TRIMPEX) 100 MG tablet Take 100 mg by mouth daily.    Marland Kitchen warfarin (COUMADIN) 5 MG tablet take 1 and 1/2 tablets once daily or as directed PER COAGULATION CLINIC (Patient taking differently: Take 5 mg by mouth daily. ) 135 tablet 0   No current facility-administered medications for this visit.   Allergies:  Gluten; Hydromorphone hcl; and Prednisone   Social History: The patient  reports that she quit smoking about 20 years ago. Her smoking use included Cigarettes. She has never used smokeless tobacco. She reports that she drinks alcohol. She reports that she does not use illicit drugs.   ROS:  Please see the history of present illness. Otherwise, complete review of systems is positive for none.  All other systems are reviewed and negative.   Physical Exam: VS:  BP 100/62 mmHg  Pulse 95  Ht _0  (1.727 m)  Wt 219 lb (99.338 kg)  BMI 33.31 kg/m2  SpO2 96%, BMI Body mass  index is 33.31 kg/(m^2).  Wt Readings from Last 3 Encounters:  03/01/15 219 lb (99.338 kg)  09/01/14 218 lb (98.884 kg)  08/28/14 218 lb (98.884 kg)    No acute distress.  HEENT: Conjunctiva and lids normal, oropharynx clear.  Neck: Supple, no elevated JVP or carotid bruits, no thyromegaly.  Lungs: Clear to auscultation, diminished, nonlabored breathing at rest.  Cardiac: Irregularly irregular, distant, no S3.  Abdomen: Soft, nontender, bowel sounds present.  Extremities: No pitting edema, distal pulses 2+.   ECG: Tracing from 08/28/2014 showed atrial fibrillation with PVC versus aberrantly conducted beat, low voltage in the precordial leads.  Recent  Labwork: 08/28/2014: BUN 34*; Creatinine, Ser 1.98*; Hemoglobin 12.7; Magnesium 1.6*; Platelets 302; Potassium 4.3; Sodium 137   Other Studies Reviewed Today:  Echocardiogram 09/16/2012: Study Conclusions  - Left ventricle: The cavity size was normal. Wall thickness was increased in a pattern of moderate LVH. The estimated ejection fraction was 65%. Wall motion was normal; there were no regional wall motion abnormalities. - Aortic valve: Sclerosis without stenosis. No significant regurgitation. - Left atrium: The atrium was mildly to moderately dilated. - Right ventricle: The cavity size was normal. Systolic function was normal. - Right atrium: The atrium was mildly dilated.  Assessment and Plan:  1. Paroxysmal atrial fibrillation. Continue current medical regimen including Coumadin and Cardizem CD for heart rate control. She will maintain follow-up in the anticoagulation clinic.  2. Essential hypertension. She states that her blood pressure has been low normal when she checks it. She is following with Dr. Woody Seller. May need to consider down titrating lisinopril if she is at all symptomatic.  3. History of pulmonary hypertension, with improvement over time. Last RVSP 45 mmHg.  Current medicines were reviewed with the patient today.  Disposition: FU with me in 6 months.   Signed, Satira Sark, MD, [facility] 03/01/2015 10:31 AM    [facility] at [place], [place], [place] [address] Phone: [phone]; Fax: [phone]

## 2015-03-13 ENCOUNTER — Ambulatory Visit (INDEPENDENT_AMBULATORY_CARE_PROVIDER_SITE_OTHER): Payer: PPO | Admitting: *Deleted

## 2015-03-13 DIAGNOSIS — Z5181 Encounter for therapeutic drug level monitoring: Secondary | ICD-10-CM | POA: Diagnosis not present

## 2015-03-13 DIAGNOSIS — I4891 Unspecified atrial fibrillation: Secondary | ICD-10-CM

## 2015-03-13 LAB — POCT INR: INR: 2.1

## 2015-04-03 DIAGNOSIS — N390 Urinary tract infection, site not specified: Secondary | ICD-10-CM | POA: Diagnosis not present

## 2015-04-03 DIAGNOSIS — R339 Retention of urine, unspecified: Secondary | ICD-10-CM | POA: Diagnosis not present

## 2015-04-10 ENCOUNTER — Ambulatory Visit (INDEPENDENT_AMBULATORY_CARE_PROVIDER_SITE_OTHER): Payer: PPO | Admitting: *Deleted

## 2015-04-10 DIAGNOSIS — Z5181 Encounter for therapeutic drug level monitoring: Secondary | ICD-10-CM | POA: Diagnosis not present

## 2015-04-10 DIAGNOSIS — I4891 Unspecified atrial fibrillation: Secondary | ICD-10-CM

## 2015-04-10 LAB — POCT INR: INR: 1.7

## 2015-04-24 DIAGNOSIS — E1165 Type 2 diabetes mellitus with hyperglycemia: Secondary | ICD-10-CM | POA: Diagnosis not present

## 2015-04-24 DIAGNOSIS — Z87891 Personal history of nicotine dependence: Secondary | ICD-10-CM | POA: Diagnosis not present

## 2015-04-24 DIAGNOSIS — Z299 Encounter for prophylactic measures, unspecified: Secondary | ICD-10-CM | POA: Diagnosis not present

## 2015-05-01 ENCOUNTER — Ambulatory Visit (INDEPENDENT_AMBULATORY_CARE_PROVIDER_SITE_OTHER): Payer: PPO | Admitting: *Deleted

## 2015-05-01 DIAGNOSIS — Z5181 Encounter for therapeutic drug level monitoring: Secondary | ICD-10-CM | POA: Diagnosis not present

## 2015-05-01 DIAGNOSIS — I4891 Unspecified atrial fibrillation: Secondary | ICD-10-CM

## 2015-05-01 LAB — POCT INR: INR: 2.6

## 2015-05-02 DIAGNOSIS — H2513 Age-related nuclear cataract, bilateral: Secondary | ICD-10-CM | POA: Diagnosis not present

## 2015-05-02 DIAGNOSIS — E119 Type 2 diabetes mellitus without complications: Secondary | ICD-10-CM | POA: Diagnosis not present

## 2015-05-29 ENCOUNTER — Ambulatory Visit (INDEPENDENT_AMBULATORY_CARE_PROVIDER_SITE_OTHER): Payer: PPO | Admitting: *Deleted

## 2015-05-29 DIAGNOSIS — I4891 Unspecified atrial fibrillation: Secondary | ICD-10-CM | POA: Diagnosis not present

## 2015-05-29 DIAGNOSIS — Z5181 Encounter for therapeutic drug level monitoring: Secondary | ICD-10-CM | POA: Diagnosis not present

## 2015-05-29 LAB — POCT INR: INR: 2.4

## 2015-06-26 ENCOUNTER — Ambulatory Visit (INDEPENDENT_AMBULATORY_CARE_PROVIDER_SITE_OTHER): Payer: PPO | Admitting: *Deleted

## 2015-06-26 DIAGNOSIS — Z5181 Encounter for therapeutic drug level monitoring: Secondary | ICD-10-CM

## 2015-06-26 DIAGNOSIS — I4891 Unspecified atrial fibrillation: Secondary | ICD-10-CM | POA: Diagnosis not present

## 2015-06-26 LAB — POCT INR: INR: 1.7

## 2015-07-12 DIAGNOSIS — F419 Anxiety disorder, unspecified: Secondary | ICD-10-CM | POA: Diagnosis not present

## 2015-07-17 ENCOUNTER — Ambulatory Visit (INDEPENDENT_AMBULATORY_CARE_PROVIDER_SITE_OTHER): Payer: PPO | Admitting: *Deleted

## 2015-07-17 DIAGNOSIS — I4891 Unspecified atrial fibrillation: Secondary | ICD-10-CM | POA: Diagnosis not present

## 2015-07-17 DIAGNOSIS — Z5181 Encounter for therapeutic drug level monitoring: Secondary | ICD-10-CM

## 2015-07-17 LAB — POCT INR: INR: 2

## 2015-07-31 DIAGNOSIS — Z1211 Encounter for screening for malignant neoplasm of colon: Secondary | ICD-10-CM | POA: Diagnosis not present

## 2015-07-31 DIAGNOSIS — E7800 Pure hypercholesterolemia, unspecified: Secondary | ICD-10-CM | POA: Diagnosis not present

## 2015-07-31 DIAGNOSIS — Z7189 Other specified counseling: Secondary | ICD-10-CM | POA: Diagnosis not present

## 2015-07-31 DIAGNOSIS — G4700 Insomnia, unspecified: Secondary | ICD-10-CM | POA: Diagnosis not present

## 2015-07-31 DIAGNOSIS — Z1389 Encounter for screening for other disorder: Secondary | ICD-10-CM | POA: Diagnosis not present

## 2015-07-31 DIAGNOSIS — Z299 Encounter for prophylactic measures, unspecified: Secondary | ICD-10-CM | POA: Diagnosis not present

## 2015-07-31 DIAGNOSIS — R5383 Other fatigue: Secondary | ICD-10-CM | POA: Diagnosis not present

## 2015-07-31 DIAGNOSIS — Z0000 Encounter for general adult medical examination without abnormal findings: Secondary | ICD-10-CM | POA: Diagnosis not present

## 2015-07-31 DIAGNOSIS — Z6832 Body mass index (BMI) 32.0-32.9, adult: Secondary | ICD-10-CM | POA: Diagnosis not present

## 2015-07-31 DIAGNOSIS — E1165 Type 2 diabetes mellitus with hyperglycemia: Secondary | ICD-10-CM | POA: Diagnosis not present

## 2015-08-01 DIAGNOSIS — R5383 Other fatigue: Secondary | ICD-10-CM | POA: Diagnosis not present

## 2015-08-01 DIAGNOSIS — E7800 Pure hypercholesterolemia, unspecified: Secondary | ICD-10-CM | POA: Diagnosis not present

## 2015-08-01 DIAGNOSIS — Z79899 Other long term (current) drug therapy: Secondary | ICD-10-CM | POA: Diagnosis not present

## 2015-08-02 ENCOUNTER — Other Ambulatory Visit: Payer: Self-pay | Admitting: Cardiology

## 2015-08-06 DIAGNOSIS — Z79899 Other long term (current) drug therapy: Secondary | ICD-10-CM | POA: Diagnosis not present

## 2015-08-07 ENCOUNTER — Ambulatory Visit (INDEPENDENT_AMBULATORY_CARE_PROVIDER_SITE_OTHER): Payer: PPO | Admitting: *Deleted

## 2015-08-07 DIAGNOSIS — I4891 Unspecified atrial fibrillation: Secondary | ICD-10-CM | POA: Diagnosis not present

## 2015-08-07 DIAGNOSIS — I270 Primary pulmonary hypertension: Secondary | ICD-10-CM | POA: Diagnosis not present

## 2015-08-07 DIAGNOSIS — Z5181 Encounter for therapeutic drug level monitoring: Secondary | ICD-10-CM | POA: Diagnosis not present

## 2015-08-07 DIAGNOSIS — F419 Anxiety disorder, unspecified: Secondary | ICD-10-CM | POA: Diagnosis not present

## 2015-08-07 DIAGNOSIS — E1122 Type 2 diabetes mellitus with diabetic chronic kidney disease: Secondary | ICD-10-CM | POA: Diagnosis not present

## 2015-08-07 DIAGNOSIS — N184 Chronic kidney disease, stage 4 (severe): Secondary | ICD-10-CM | POA: Diagnosis not present

## 2015-08-07 LAB — POCT INR: INR: 1.9

## 2015-08-27 DIAGNOSIS — E1122 Type 2 diabetes mellitus with diabetic chronic kidney disease: Secondary | ICD-10-CM | POA: Diagnosis not present

## 2015-08-28 ENCOUNTER — Ambulatory Visit (INDEPENDENT_AMBULATORY_CARE_PROVIDER_SITE_OTHER): Payer: PPO | Admitting: *Deleted

## 2015-08-28 DIAGNOSIS — Z5181 Encounter for therapeutic drug level monitoring: Secondary | ICD-10-CM

## 2015-08-28 DIAGNOSIS — I4891 Unspecified atrial fibrillation: Secondary | ICD-10-CM

## 2015-08-28 DIAGNOSIS — E1165 Type 2 diabetes mellitus with hyperglycemia: Secondary | ICD-10-CM | POA: Diagnosis not present

## 2015-08-28 DIAGNOSIS — N184 Chronic kidney disease, stage 4 (severe): Secondary | ICD-10-CM | POA: Diagnosis not present

## 2015-08-28 DIAGNOSIS — E1122 Type 2 diabetes mellitus with diabetic chronic kidney disease: Secondary | ICD-10-CM | POA: Diagnosis not present

## 2015-08-28 LAB — POCT INR: INR: 2.3

## 2015-09-05 NOTE — Progress Notes (Signed)
Cardiology Office Note  Date: 09/06/2015   ID: Wanda [name], DOB [birth year], MRN [id number]  PCP: Glenda Chroman, MD  Primary Cardiologist: Rozann Lesches, MD   Chief Complaint  Patient presents with  . Atrial Fibrillation    History of Present Illness: Wanda [name] is a 75 y.o. female last seen in January. She presents for a routine follow-up visit. Does not report any significant palpitations despite persistent atrial fibrillation. She continues on Cardizem CD which she takes in the morning, also Coumadin which is followed in the anticoagulation clinic. She does not report any bleeding problems.  I reviewed her ECG today which shows atrial fibrillation at 101 bpm with low voltage and nonspecific ST changes. She had walked in the room just prior to having this tracing being done.  No other reported major change in health. She continues to follow with Dr. Woody Seller. Has been trying to work on reducing her carbohydrates to better control blood sugar.  Past Medical History:  Diagnosis Date  . Celiac disease   . COPD (chronic obstructive pulmonary disease) ([place])   . Essential hypertension, benign   . Goiter   . History of breast cancer    Status post mastectomy  . History of cardiac catheterization    No significant CAD 2006  . Mixed hyperlipidemia   . Paroxysmal atrial fibrillation (HCC)    Coumadin therapy  . PUD (peptic ulcer disease)   . Pulmonary hypertension ([place])   . Type 2 diabetes mellitus ([place])     Current Outpatient Prescriptions  Medication Sig Dispense Refill  . ALPRAZolam (XANAX) 1 MG tablet Take 1 mg by mouth 2 (two) times daily as needed for anxiety or sleep.     Marland Kitchen atorvastatin (LIPITOR) 10 MG tablet Take 10 mg by mouth every evening.     . colchicine 0.6 MG tablet Take 0.6 mg by mouth daily as needed (FOR GOUT RELIEF).   2  . diltiazem (CARDIZEM CD) 240 MG 24 hr capsule TAKE ONE CAPSULE BY MOUTH DAILY 30 capsule 6  . glimepiride (AMARYL) 2 MG tablet Take 2  mg by mouth daily with breakfast.    . lisinopril (PRINIVIL,ZESTRIL) 20 MG tablet TAKE 1 TABLET DAILY 90 tablet 2  . tamsulosin (FLOMAX) 0.4 MG CAPS capsule Take 0.4 mg by mouth daily. TAKE 30 MINUTES AFTER THE SAME MEAL EACH DAY  2  . trimethoprim (TRIMPEX) 100 MG tablet Take 100 mg by mouth daily.    Marland Kitchen warfarin (COUMADIN) 5 MG tablet take 1 and 1/2 tablets once daily or as directed PER COAGULATION CLINIC (Patient taking differently: Take 5 mg by mouth daily. ) 135 tablet 0  . sennosides-docusate sodium (SENOKOT-S) 8.6-50 MG tablet Take 1 tablet by mouth daily as needed for constipation.     No current facility-administered medications for this visit.    Allergies:  Gluten; Hydromorphone hcl; and Prednisone   Social History: The patient  reports that she quit smoking about 20 years ago. Her smoking use included Cigarettes. She has never used smokeless tobacco. She reports that she drinks alcohol. She reports that she does not use drugs.   ROS:  Please see the history of present illness. Otherwise, complete review of systems is positive for none.  All other systems are reviewed and negative.   Physical Exam: VS:  BP 122/78   Pulse 90   Ht 5' 8" (1.727 m)   Wt 225 lb (102.1 kg)   SpO2 93%   BMI 34.21 kg/m ,  BMI Body mass index is 34.21 kg/m.  Wt Readings from Last 3 Encounters:  09/06/15 225 lb (102.1 kg)  03/01/15 219 lb (99.3 kg)  09/01/14 218 lb (98.9 kg)    No acute distress.  HEENT: Conjunctiva and lids normal, oropharynx clear.  Neck: Supple, no elevated JVP or carotid bruits, no thyromegaly.  Lungs: Clear to auscultation, diminished, nonlabored breathing at rest.  Cardiac: Irregularly irregular, distant, no S3.  Abdomen: Soft, nontender, bowel sounds present.  Extremities: No pitting edema, distal pulses 2+.  ECG: I personally reviewed the tracing from 08/28/2014 which showed atrial fibrillation with low voltage and PVC.  Recent Labwork:  08/28/2014: BUN 34*;  Creatinine, Ser 1.98*; Hemoglobin 12.7; Magnesium 1.6*; Platelets 302; Potassium 4.3; Sodium 137   Other Studies Reviewed Today:  Echocardiogram 09/16/2012: Study Conclusions  - Left ventricle: The cavity size was normal. Wall thickness was increased in a pattern of moderate LVH. The estimated ejection fraction was 65%. Wall motion was normal; there were no regional wall motion abnormalities. - Aortic valve: Sclerosis without stenosis. No significant regurgitation. - Left atrium: The atrium was mildly to moderately dilated. - Right ventricle: The cavity size was normal. Systolic function was normal. - Right atrium: The atrium was mildly dilated.  Assessment and Plan:  1. Persistent atrial fibrillation. She is asymptomatic in terms of palpitations. We will continue current dose of Cardizem CD as well as follow-up in the anticoagulation clinic on Coumadin.  2. Essential hypertension, blood pressure control is good today. She is on lisinopril in addition to Cardizem CD.  Current medicines were reviewed with the patient today.   Orders Placed This Encounter  Procedures  . EKG 12-Lead    Disposition: Follow-up with me in 6 months.  Signed, Satira Sark, MD, [facility] 09/06/2015 11:52 AM    [facility] at [place], [place], [place] [address] Phone: [phone]; Fax: [phone]

## 2015-09-06 ENCOUNTER — Encounter: Payer: Self-pay | Admitting: Cardiology

## 2015-09-06 ENCOUNTER — Ambulatory Visit (INDEPENDENT_AMBULATORY_CARE_PROVIDER_SITE_OTHER): Payer: PPO | Admitting: Cardiology

## 2015-09-06 VITALS — BP 122/78 | HR 90 | Ht 68.0 in | Wt 225.0 lb

## 2015-09-06 DIAGNOSIS — I4819 Other persistent atrial fibrillation: Secondary | ICD-10-CM

## 2015-09-06 DIAGNOSIS — I481 Persistent atrial fibrillation: Secondary | ICD-10-CM

## 2015-09-06 DIAGNOSIS — I10 Essential (primary) hypertension: Secondary | ICD-10-CM

## 2015-09-06 NOTE — Patient Instructions (Signed)

## 2015-09-25 ENCOUNTER — Ambulatory Visit (INDEPENDENT_AMBULATORY_CARE_PROVIDER_SITE_OTHER): Payer: PPO | Admitting: *Deleted

## 2015-09-25 DIAGNOSIS — Z5181 Encounter for therapeutic drug level monitoring: Secondary | ICD-10-CM

## 2015-09-25 DIAGNOSIS — I4891 Unspecified atrial fibrillation: Secondary | ICD-10-CM

## 2015-09-25 LAB — POCT INR: INR: 2.5

## 2015-10-02 DIAGNOSIS — N390 Urinary tract infection, site not specified: Secondary | ICD-10-CM | POA: Diagnosis not present

## 2015-10-02 DIAGNOSIS — R339 Retention of urine, unspecified: Secondary | ICD-10-CM | POA: Diagnosis not present

## 2015-10-23 ENCOUNTER — Ambulatory Visit (INDEPENDENT_AMBULATORY_CARE_PROVIDER_SITE_OTHER): Payer: PPO | Admitting: *Deleted

## 2015-10-23 DIAGNOSIS — Z5181 Encounter for therapeutic drug level monitoring: Secondary | ICD-10-CM

## 2015-10-23 DIAGNOSIS — I4891 Unspecified atrial fibrillation: Secondary | ICD-10-CM | POA: Diagnosis not present

## 2015-10-23 LAB — POCT INR: INR: 2.9

## 2015-10-29 ENCOUNTER — Encounter (INDEPENDENT_AMBULATORY_CARE_PROVIDER_SITE_OTHER): Payer: Self-pay | Admitting: *Deleted

## 2015-10-31 DIAGNOSIS — M25512 Pain in left shoulder: Secondary | ICD-10-CM | POA: Diagnosis not present

## 2015-10-31 DIAGNOSIS — E1165 Type 2 diabetes mellitus with hyperglycemia: Secondary | ICD-10-CM | POA: Diagnosis not present

## 2015-10-31 DIAGNOSIS — Z794 Long term (current) use of insulin: Secondary | ICD-10-CM | POA: Diagnosis not present

## 2015-10-31 DIAGNOSIS — M25511 Pain in right shoulder: Secondary | ICD-10-CM | POA: Diagnosis not present

## 2015-10-31 DIAGNOSIS — J449 Chronic obstructive pulmonary disease, unspecified: Secondary | ICD-10-CM | POA: Diagnosis not present

## 2015-10-31 DIAGNOSIS — G8929 Other chronic pain: Secondary | ICD-10-CM | POA: Diagnosis not present

## 2015-10-31 DIAGNOSIS — E118 Type 2 diabetes mellitus with unspecified complications: Secondary | ICD-10-CM | POA: Diagnosis not present

## 2015-10-31 DIAGNOSIS — M0579 Rheumatoid arthritis with rheumatoid factor of multiple sites without organ or systems involvement: Secondary | ICD-10-CM | POA: Diagnosis not present

## 2015-10-31 DIAGNOSIS — N183 Chronic kidney disease, stage 3 (moderate): Secondary | ICD-10-CM | POA: Diagnosis not present

## 2015-11-07 DIAGNOSIS — E1165 Type 2 diabetes mellitus with hyperglycemia: Secondary | ICD-10-CM | POA: Diagnosis not present

## 2015-11-07 DIAGNOSIS — I270 Primary pulmonary hypertension: Secondary | ICD-10-CM | POA: Diagnosis not present

## 2015-11-07 DIAGNOSIS — J449 Chronic obstructive pulmonary disease, unspecified: Secondary | ICD-10-CM | POA: Diagnosis not present

## 2015-11-07 DIAGNOSIS — M545 Low back pain: Secondary | ICD-10-CM | POA: Diagnosis not present

## 2015-11-20 ENCOUNTER — Ambulatory Visit (INDEPENDENT_AMBULATORY_CARE_PROVIDER_SITE_OTHER): Payer: PPO | Admitting: *Deleted

## 2015-11-20 DIAGNOSIS — I4891 Unspecified atrial fibrillation: Secondary | ICD-10-CM | POA: Diagnosis not present

## 2015-11-20 DIAGNOSIS — Z5181 Encounter for therapeutic drug level monitoring: Secondary | ICD-10-CM

## 2015-11-20 LAB — POCT INR: INR: 2.3

## 2015-11-21 DIAGNOSIS — L570 Actinic keratosis: Secondary | ICD-10-CM | POA: Diagnosis not present

## 2015-11-21 DIAGNOSIS — D485 Neoplasm of uncertain behavior of skin: Secondary | ICD-10-CM | POA: Diagnosis not present

## 2015-11-21 DIAGNOSIS — L820 Inflamed seborrheic keratosis: Secondary | ICD-10-CM | POA: Diagnosis not present

## 2015-11-21 DIAGNOSIS — L0889 Other specified local infections of the skin and subcutaneous tissue: Secondary | ICD-10-CM | POA: Diagnosis not present

## 2015-11-29 ENCOUNTER — Telehealth: Payer: Self-pay | Admitting: Cardiology

## 2015-11-29 NOTE — Telephone Encounter (Signed)
Patient would like to know if she can be switched to Xarelto

## 2015-11-29 NOTE — Telephone Encounter (Signed)
Fwd to Dr. Domenic Polite for approval.  Patient aware MD out of office till Monday.

## 2015-11-30 NOTE — Telephone Encounter (Signed)
Patient informed and verbalized understanding of plan. Patient said that she would talk with Lattie Haw about this at her next scheduled visit on 01/01/16.

## 2015-11-30 NOTE — Telephone Encounter (Signed)
We can likely make that switch. Would handle this through the anticoagulation clinic since we already follow her Coumadin. Lattie Haw will probably need to get lab work first before making switch to see about her creatinine level.

## 2016-01-02 DIAGNOSIS — C50912 Malignant neoplasm of unspecified site of left female breast: Secondary | ICD-10-CM | POA: Diagnosis not present

## 2016-01-10 ENCOUNTER — Ambulatory Visit (INDEPENDENT_AMBULATORY_CARE_PROVIDER_SITE_OTHER): Payer: PPO | Admitting: *Deleted

## 2016-01-10 DIAGNOSIS — I4891 Unspecified atrial fibrillation: Secondary | ICD-10-CM | POA: Diagnosis not present

## 2016-01-10 DIAGNOSIS — Z5181 Encounter for therapeutic drug level monitoring: Secondary | ICD-10-CM | POA: Diagnosis not present

## 2016-01-10 LAB — POCT INR: INR: 3.1

## 2016-01-14 DIAGNOSIS — Z299 Encounter for prophylactic measures, unspecified: Secondary | ICD-10-CM | POA: Diagnosis not present

## 2016-01-14 DIAGNOSIS — J449 Chronic obstructive pulmonary disease, unspecified: Secondary | ICD-10-CM | POA: Diagnosis not present

## 2016-01-14 DIAGNOSIS — E1165 Type 2 diabetes mellitus with hyperglycemia: Secondary | ICD-10-CM | POA: Diagnosis not present

## 2016-01-14 DIAGNOSIS — I10 Essential (primary) hypertension: Secondary | ICD-10-CM | POA: Diagnosis not present

## 2016-01-14 DIAGNOSIS — F419 Anxiety disorder, unspecified: Secondary | ICD-10-CM | POA: Diagnosis not present

## 2016-01-31 DIAGNOSIS — Z87440 Personal history of urinary (tract) infections: Secondary | ICD-10-CM | POA: Diagnosis not present

## 2016-01-31 DIAGNOSIS — R339 Retention of urine, unspecified: Secondary | ICD-10-CM | POA: Diagnosis not present

## 2016-01-31 DIAGNOSIS — R3915 Urgency of urination: Secondary | ICD-10-CM | POA: Diagnosis not present

## 2016-02-21 ENCOUNTER — Ambulatory Visit (INDEPENDENT_AMBULATORY_CARE_PROVIDER_SITE_OTHER): Payer: PPO | Admitting: *Deleted

## 2016-02-21 DIAGNOSIS — I4891 Unspecified atrial fibrillation: Secondary | ICD-10-CM

## 2016-02-21 DIAGNOSIS — Z5181 Encounter for therapeutic drug level monitoring: Secondary | ICD-10-CM

## 2016-02-21 LAB — POCT INR: INR: 3.2

## 2016-02-29 DIAGNOSIS — N359 Urethral stricture, unspecified: Secondary | ICD-10-CM | POA: Diagnosis not present

## 2016-02-29 DIAGNOSIS — E119 Type 2 diabetes mellitus without complications: Secondary | ICD-10-CM | POA: Diagnosis not present

## 2016-03-19 NOTE — Progress Notes (Deleted)
Cardiology Office Note  Date: 03/19/2016   ID: [name], DOB December 18, 1940, MRN [id number]  PCP: Glenda Chroman, MD  Primary Cardiologist: Rozann Lesches, MD   No chief complaint on file.   History of Present Illness: Wanda [name] is a 76 y.o. female last seen in July 2017.  She continues to follow in the anticoagulation clinic on Coumadin.  Past Medical History:  Diagnosis Date  . Celiac disease   . COPD (chronic obstructive pulmonary disease) ([place])   . Essential hypertension, benign   . Goiter   . History of breast cancer    Status post mastectomy  . History of cardiac catheterization    No significant CAD 2006  . Mixed hyperlipidemia   . Paroxysmal atrial fibrillation (HCC)    Coumadin therapy  . PUD (peptic ulcer disease)   . Pulmonary hypertension   . Type 2 diabetes mellitus ([place])     Past Surgical History:  Procedure Laterality Date  . DILATION AND CURETTAGE OF UTERUS    . HYSTEROSCOPY W/ ENDOMETRIAL ABLATION     [employer]  . MASTECTOMY      Current Outpatient Prescriptions  Medication Sig Dispense Refill  . ALPRAZolam (XANAX) 1 MG tablet Take 1 mg by mouth 2 (two) times daily as needed for anxiety or sleep.     Marland Kitchen atorvastatin (LIPITOR) 10 MG tablet Take 10 mg by mouth every evening.     . diltiazem (CARDIZEM CD) 240 MG 24 hr capsule TAKE ONE CAPSULE BY MOUTH DAILY 30 capsule 6  . glimepiride (AMARYL) 2 MG tablet Take 2 mg by mouth daily with breakfast.    . lisinopril (PRINIVIL,ZESTRIL) 20 MG tablet TAKE 1 TABLET DAILY 90 tablet 2  . sennosides-docusate sodium (SENOKOT-S) 8.6-50 MG tablet Take 1 tablet by mouth daily as needed for constipation.    . tamsulosin (FLOMAX) 0.4 MG CAPS capsule Take 0.4 mg by mouth daily. TAKE 30 MINUTES AFTER THE SAME MEAL EACH DAY  2  . trimethoprim (TRIMPEX) 100 MG tablet Take 100 mg by mouth daily.    Marland Kitchen warfarin (COUMADIN) 5 MG tablet take 1 and 1/2 tablets once daily or as directed PER COAGULATION CLINIC (Patient  taking differently: Take 5 mg by mouth daily. ) 135 tablet 0   No current facility-administered medications for this visit.    Allergies:  Gluten; Hydromorphone hcl; and Prednisone   Social History: The patient  reports that she quit smoking about 21 years ago. Her smoking use included Cigarettes. She has never used smokeless tobacco. She reports that she drinks alcohol. She reports that she does not use drugs.   Family History: The patient's family history is not on file.   ROS:  Please see the history of present illness. Otherwise, complete review of systems is positive for {NONE DEFAULTED:18576::"none"}.  All other systems are reviewed and negative.   Physical Exam: VS:  There were no vitals taken for this visit., BMI There is no height or weight on file to calculate BMI.  Wt Readings from Last 3 Encounters:  09/06/15 225 lb (102.1 kg)  03/01/15 219 lb (99.3 kg)  09/01/14 218 lb (98.9 kg)    No acute distress.  HEENT: Conjunctiva and lids normal, oropharynx clear.  Neck: Supple, no elevated JVP or carotid bruits, no thyromegaly.  Lungs: Clear to auscultation, diminished, nonlabored breathing at rest.  Cardiac: Irregularly irregular, distant, no S3.  Abdomen: Soft, nontender, bowel sounds present.  Extremities: No pitting edema, distal pulses 2+.  ECG:  I personally reviewed the tracing from 09/06/2015 which showed atrial fibrillation with low-voltage  Recent Labwork:   Other Studies Reviewed Today:  Echocardiogram 09/16/2012: Study Conclusions  - Left ventricle: The cavity size was normal. Wall thickness was increased in a pattern of moderate LVH. The estimated ejection fraction was 65%. Wall motion was normal; there were no regional wall motion abnormalities. - Aortic valve: Sclerosis without stenosis. No significant regurgitation. - Left atrium: The atrium was mildly to moderately dilated. - Right ventricle: The cavity size was normal. Systolic function  was normal. - Right atrium: The atrium was mildly dilated.  Assessment and Plan:    Current medicines were reviewed with the patient today.  No orders of the defined types were placed in this encounter.   Disposition:  Signed, Satira Sark, MD, [facility] 03/19/2016 11:04 AM    [facility] at [place], [place], [place] [address] Phone: [phone]; Fax: [phone]

## 2016-03-20 ENCOUNTER — Ambulatory Visit (INDEPENDENT_AMBULATORY_CARE_PROVIDER_SITE_OTHER): Payer: PPO | Admitting: *Deleted

## 2016-03-20 ENCOUNTER — Ambulatory Visit: Payer: PPO | Admitting: Cardiology

## 2016-03-20 DIAGNOSIS — I4891 Unspecified atrial fibrillation: Secondary | ICD-10-CM | POA: Diagnosis not present

## 2016-03-20 DIAGNOSIS — Z5181 Encounter for therapeutic drug level monitoring: Secondary | ICD-10-CM

## 2016-03-20 LAB — POCT INR: INR: 3.1

## 2016-03-24 ENCOUNTER — Ambulatory Visit (INDEPENDENT_AMBULATORY_CARE_PROVIDER_SITE_OTHER): Payer: PPO | Admitting: Cardiology

## 2016-03-24 ENCOUNTER — Encounter: Payer: Self-pay | Admitting: Cardiology

## 2016-03-24 VITALS — BP 138/80 | HR 85 | Ht 69.0 in | Wt 222.0 lb

## 2016-03-24 DIAGNOSIS — I10 Essential (primary) hypertension: Secondary | ICD-10-CM

## 2016-03-24 DIAGNOSIS — I480 Paroxysmal atrial fibrillation: Secondary | ICD-10-CM

## 2016-03-24 NOTE — Progress Notes (Signed)
Cardiology Office Note  Date: 03/24/2016   ID: Kennidee Heyne, DOB April 04, 1940, MRN [id number]  PCP: Glenda Chroman, MD  Primary Cardiologist: Rozann Lesches, MD   Chief Complaint  Patient presents with  . PAF    History of Present Illness: Wanda [name] is a 76 y.o. female last seen in July 2017. She presents for a routine follow-up visit. States that she has had some recent palpitations that have been self-limited. Has had some stress in her life, but not necessarily associated with these palpitations. We reviewed her medications which are outlined below, she was wondering whether if she took the Cardizem CD in the morning it might be more effective.  She continues to follow in the anticoagulation clinic on Coumadin. No spontaneous bleeding problems. Last INR was 3.1.  Past Medical History:  Diagnosis Date  . Celiac disease   . COPD (chronic obstructive pulmonary disease) ([place])   . Essential hypertension, benign   . Goiter   . History of breast cancer    Status post mastectomy  . History of cardiac catheterization    No significant CAD 2006  . Mixed hyperlipidemia   . Paroxysmal atrial fibrillation (HCC)    Coumadin therapy  . PUD (peptic ulcer disease)   . Pulmonary hypertension   . Type 2 diabetes mellitus ([place])     Current Outpatient Prescriptions  Medication Sig Dispense Refill  . ALPRAZolam (XANAX) 1 MG tablet Take 1 mg by mouth 2 (two) times daily as needed for anxiety or sleep.     Marland Kitchen atorvastatin (LIPITOR) 10 MG tablet Take 10 mg by mouth every evening.     . diltiazem (CARDIZEM CD) 240 MG 24 hr capsule TAKE ONE CAPSULE BY MOUTH DAILY 30 capsule 6  . glimepiride (AMARYL) 2 MG tablet Take 2 mg by mouth daily with breakfast.    . lisinopril (PRINIVIL,ZESTRIL) 20 MG tablet TAKE 1 TABLET DAILY 90 tablet 2  . sennosides-docusate sodium (SENOKOT-S) 8.6-50 MG tablet Take 1 tablet by mouth daily as needed for constipation.    . tamsulosin (FLOMAX) 0.4 MG CAPS capsule  Take 0.4 mg by mouth daily. TAKE 30 MINUTES AFTER THE SAME MEAL EACH DAY  2  . trimethoprim (TRIMPEX) 100 MG tablet Take 100 mg by mouth daily.    Marland Kitchen warfarin (COUMADIN) 5 MG tablet take 1 and 1/2 tablets once daily or as directed PER COAGULATION CLINIC (Patient taking differently: Take 5 mg by mouth daily. ) 135 tablet 0   No current facility-administered medications for this visit.    Allergies:  Gluten; Hydromorphone hcl; and Prednisone   Social History: The patient  reports that she quit smoking about 21 years ago. Her smoking use included Cigarettes. She has never used smokeless tobacco. She reports that she drinks alcohol. She reports that she does not use drugs.   ROS:  Please see the history of present illness. Otherwise, complete review of systems is positive for none.  All other systems are reviewed and negative.   Physical Exam: VS:  BP 138/80   Pulse 85   Ht _0  (1.753 m)   Wt 222 lb (100.7 kg)   SpO2 90%   BMI 32.78 kg/m , BMI Body mass index is 32.78 kg/m.  Wt Readings from Last 3 Encounters:  03/24/16 222 lb (100.7 kg)  09/06/15 225 lb (102.1 kg)  03/01/15 219 lb (99.3 kg)    No acute distress.  HEENT: Conjunctiva and lids normal, oropharynx clear.  Neck: Supple, no  elevated JVP or carotid bruits, no thyromegaly.  Lungs: Clear to auscultation, diminished, nonlabored breathing at rest.  Cardiac: Irregularly irregular, distant, no S3.  Abdomen: Soft, nontender, bowel sounds present.  Extremities: No pitting edema, distal pulses 2+.  ECG: I personally reviewed the tracing from 09/06/2015 which showed atrial fibrillation with low voltage and nonspecific ST changes.  Other Studies Reviewed Today:  Echocardiogram 09/16/2012: Study Conclusions  - Left ventricle: The cavity size was normal. Wall thickness was increased in a pattern of moderate LVH. The estimated ejection fraction was 65%. Wall motion was normal; there were no regional wall motion  abnormalities. - Aortic valve: Sclerosis without stenosis. No significant regurgitation. - Left atrium: The atrium was mildly to moderately dilated. - Right ventricle: The cavity size was normal. Systolic function was normal. - Right atrium: The atrium was mildly dilated.  Assessment and Plan:  1. Paroxysmal to persistent atrial fibrillation. Plan is to continue present regimen but take Cardizem CD and the mornings sit in the evening. Continue observation for now. Coumadin follow-up in the anticoagulation clinic.  2. Essential hypertension, blood pressure control is adequate today.  Current medicines were reviewed with the patient today.  Disposition: Follow-up in 6 months.   Signed, Satira Sark, MD, [facility] 03/24/2016 4:13 PM    [facility] at [facility] [address] [place], [place] [address] Phone: [phone]; Fax: [phone]

## 2016-03-24 NOTE — Patient Instructions (Signed)
Your physician wants you to follow-up in: 6 months with Dr Ferne Reus will receive a reminder letter in the mail two months in advance. If you don't receive a letter, please call our office to schedule the follow-up appointment.     Take your Cardizem in the morning's .    If you need a refill on your cardiac medications before your next appointment, please call your pharmacy.     Thank you for choosing [facility] !

## 2016-03-25 ENCOUNTER — Other Ambulatory Visit: Payer: Self-pay | Admitting: *Deleted

## 2016-03-25 MED ORDER — DILTIAZEM HCL ER COATED BEADS 240 MG PO CP24: 240.0000 mg | ORAL_CAPSULE | Freq: Every day | ORAL | 6 refills | Status: DC

## 2016-04-14 DIAGNOSIS — F419 Anxiety disorder, unspecified: Secondary | ICD-10-CM | POA: Diagnosis not present

## 2016-04-14 DIAGNOSIS — E1122 Type 2 diabetes mellitus with diabetic chronic kidney disease: Secondary | ICD-10-CM | POA: Diagnosis not present

## 2016-04-14 DIAGNOSIS — N184 Chronic kidney disease, stage 4 (severe): Secondary | ICD-10-CM | POA: Diagnosis not present

## 2016-04-14 DIAGNOSIS — L0291 Cutaneous abscess, unspecified: Secondary | ICD-10-CM | POA: Diagnosis not present

## 2016-04-14 DIAGNOSIS — Z299 Encounter for prophylactic measures, unspecified: Secondary | ICD-10-CM | POA: Diagnosis not present

## 2016-04-14 DIAGNOSIS — J449 Chronic obstructive pulmonary disease, unspecified: Secondary | ICD-10-CM | POA: Diagnosis not present

## 2016-04-14 DIAGNOSIS — Z6833 Body mass index (BMI) 33.0-33.9, adult: Secondary | ICD-10-CM | POA: Diagnosis not present

## 2016-04-14 DIAGNOSIS — Z713 Dietary counseling and surveillance: Secondary | ICD-10-CM | POA: Diagnosis not present

## 2016-04-14 DIAGNOSIS — I270 Primary pulmonary hypertension: Secondary | ICD-10-CM | POA: Diagnosis not present

## 2016-04-23 ENCOUNTER — Ambulatory Visit: Payer: PPO | Admitting: Cardiology

## 2016-04-24 ENCOUNTER — Ambulatory Visit (INDEPENDENT_AMBULATORY_CARE_PROVIDER_SITE_OTHER): Payer: PPO | Admitting: *Deleted

## 2016-04-24 DIAGNOSIS — I4891 Unspecified atrial fibrillation: Secondary | ICD-10-CM | POA: Diagnosis not present

## 2016-04-24 DIAGNOSIS — Z5181 Encounter for therapeutic drug level monitoring: Secondary | ICD-10-CM | POA: Diagnosis not present

## 2016-04-24 LAB — POCT INR: INR: 3.4

## 2016-05-22 ENCOUNTER — Ambulatory Visit (INDEPENDENT_AMBULATORY_CARE_PROVIDER_SITE_OTHER): Payer: PPO | Admitting: *Deleted

## 2016-05-22 DIAGNOSIS — Z5181 Encounter for therapeutic drug level monitoring: Secondary | ICD-10-CM

## 2016-05-22 DIAGNOSIS — I4891 Unspecified atrial fibrillation: Secondary | ICD-10-CM | POA: Diagnosis not present

## 2016-05-22 LAB — POCT INR: INR: 2.5

## 2016-06-12 DIAGNOSIS — N184 Chronic kidney disease, stage 4 (severe): Secondary | ICD-10-CM | POA: Diagnosis not present

## 2016-06-12 DIAGNOSIS — Z6832 Body mass index (BMI) 32.0-32.9, adult: Secondary | ICD-10-CM | POA: Diagnosis not present

## 2016-06-12 DIAGNOSIS — Z299 Encounter for prophylactic measures, unspecified: Secondary | ICD-10-CM | POA: Diagnosis not present

## 2016-06-12 DIAGNOSIS — E1122 Type 2 diabetes mellitus with diabetic chronic kidney disease: Secondary | ICD-10-CM | POA: Diagnosis not present

## 2016-06-12 DIAGNOSIS — I270 Primary pulmonary hypertension: Secondary | ICD-10-CM | POA: Diagnosis not present

## 2016-06-12 DIAGNOSIS — J449 Chronic obstructive pulmonary disease, unspecified: Secondary | ICD-10-CM | POA: Diagnosis not present

## 2016-06-12 DIAGNOSIS — F419 Anxiety disorder, unspecified: Secondary | ICD-10-CM | POA: Diagnosis not present

## 2016-06-17 ENCOUNTER — Ambulatory Visit (INDEPENDENT_AMBULATORY_CARE_PROVIDER_SITE_OTHER): Payer: PPO | Admitting: Urology

## 2016-06-17 ENCOUNTER — Other Ambulatory Visit (HOSPITAL_COMMUNITY)
Admission: RE | Admit: 2016-06-17 | Discharge: 2016-06-17 | Disposition: A | Discharge status: Home / Self Care | Payer: PPO | Source: Other Acute Inpatient Hospital | Attending: Urology | Admitting: Urology

## 2016-06-17 DIAGNOSIS — N952 Postmenopausal atrophic vaginitis: Secondary | ICD-10-CM | POA: Diagnosis not present

## 2016-06-17 DIAGNOSIS — R8271 Bacteriuria: Secondary | ICD-10-CM | POA: Insufficient documentation

## 2016-06-17 DIAGNOSIS — R339 Retention of urine, unspecified: Secondary | ICD-10-CM | POA: Diagnosis not present

## 2016-06-17 DIAGNOSIS — R3914 Feeling of incomplete bladder emptying: Secondary | ICD-10-CM | POA: Diagnosis not present

## 2016-06-19 ENCOUNTER — Ambulatory Visit (INDEPENDENT_AMBULATORY_CARE_PROVIDER_SITE_OTHER): Payer: PPO | Admitting: *Deleted

## 2016-06-19 DIAGNOSIS — Z5181 Encounter for therapeutic drug level monitoring: Secondary | ICD-10-CM | POA: Diagnosis not present

## 2016-06-19 DIAGNOSIS — I4891 Unspecified atrial fibrillation: Secondary | ICD-10-CM

## 2016-06-19 LAB — URINE CULTURE

## 2016-06-19 LAB — POCT INR: INR: 1.8

## 2016-07-10 ENCOUNTER — Ambulatory Visit (INDEPENDENT_AMBULATORY_CARE_PROVIDER_SITE_OTHER): Payer: PPO | Admitting: *Deleted

## 2016-07-10 DIAGNOSIS — I4891 Unspecified atrial fibrillation: Secondary | ICD-10-CM

## 2016-07-10 DIAGNOSIS — Z5181 Encounter for therapeutic drug level monitoring: Secondary | ICD-10-CM

## 2016-07-10 LAB — POCT INR: INR: 1.6

## 2016-07-22 DIAGNOSIS — R339 Retention of urine, unspecified: Secondary | ICD-10-CM | POA: Diagnosis not present

## 2016-07-24 ENCOUNTER — Ambulatory Visit (INDEPENDENT_AMBULATORY_CARE_PROVIDER_SITE_OTHER): Payer: PPO | Admitting: *Deleted

## 2016-07-24 DIAGNOSIS — Z5181 Encounter for therapeutic drug level monitoring: Secondary | ICD-10-CM | POA: Diagnosis not present

## 2016-07-24 DIAGNOSIS — I4891 Unspecified atrial fibrillation: Secondary | ICD-10-CM | POA: Diagnosis not present

## 2016-07-24 LAB — POCT INR: INR: 2.1

## 2016-08-05 ENCOUNTER — Telehealth: Payer: Self-pay | Admitting: Nurse Practitioner

## 2016-08-05 ENCOUNTER — Telehealth: Payer: Self-pay

## 2016-08-05 ENCOUNTER — Ambulatory Visit (INDEPENDENT_AMBULATORY_CARE_PROVIDER_SITE_OTHER): Payer: PPO | Admitting: *Deleted

## 2016-08-05 VITALS — BP 158/92 | Wt 222.0 lb

## 2016-08-05 DIAGNOSIS — I4891 Unspecified atrial fibrillation: Secondary | ICD-10-CM | POA: Diagnosis not present

## 2016-08-05 NOTE — Telephone Encounter (Signed)
Could you please have her come in for a nurse visit and ecg?

## 2016-08-05 NOTE — Telephone Encounter (Signed)
ECG reviewed - shows rate controlled afib, similar to when she saw Dr. Domenic Polite in Feb.  BP elevated @ nurse visit but it appears wt has been stable.  Sounds like other than swelling (no mention of amt of swelling in nurse visit note), she has been asymptomatic.  If she is stable, she can wait to f/u with Dr. Domenic Polite on 7/5 with the caveat that she should check her bp daily and contact us if she is steadily > 13mHg, and swelling persists, she would likely benefit from the addition of a low dose diuretic.

## 2016-08-05 NOTE — Telephone Encounter (Signed)
Patient notified. Will keep follow up appointment on 7/5 with Dr. Domenic Polite.

## 2016-08-05 NOTE — Telephone Encounter (Signed)
Patient called stating she was getting a lot of swelling in feet. No weight gain has noticed. Patient just moved into a new home that has concrete floors and was wondering if this could be the cause. Patient states pulse has been irregular but has not checked it. No shortness of breath or any other symptoms. Patient has appointment with Dr. Domenic Polite on July 5th and wants to know if she needs to be seen sooner.

## 2016-08-05 NOTE — Progress Notes (Signed)
Pt here per phone note - pt c/o "irregular HR" starting a few days ago with weakness says she has to take naps every day which is not normal for her - swollen ankles/feet starting 6-8 weeks ago - pt denies chest pain/SOB/dizziness - weight today 222lbs - BP 158/92 - EKG done and will forward to NP covering for Dr Domenic Polite

## 2016-08-05 NOTE — Telephone Encounter (Signed)
Patient coming in today 6/26 for a nurse visit and ECG at 2:00 pm

## 2016-08-06 DIAGNOSIS — Z299 Encounter for prophylactic measures, unspecified: Secondary | ICD-10-CM | POA: Diagnosis not present

## 2016-08-06 DIAGNOSIS — E1122 Type 2 diabetes mellitus with diabetic chronic kidney disease: Secondary | ICD-10-CM | POA: Diagnosis not present

## 2016-08-06 DIAGNOSIS — Z0000 Encounter for general adult medical examination without abnormal findings: Secondary | ICD-10-CM | POA: Diagnosis not present

## 2016-08-06 DIAGNOSIS — E7800 Pure hypercholesterolemia, unspecified: Secondary | ICD-10-CM | POA: Diagnosis not present

## 2016-08-06 DIAGNOSIS — J449 Chronic obstructive pulmonary disease, unspecified: Secondary | ICD-10-CM | POA: Diagnosis not present

## 2016-08-06 DIAGNOSIS — Z6833 Body mass index (BMI) 33.0-33.9, adult: Secondary | ICD-10-CM | POA: Diagnosis not present

## 2016-08-06 DIAGNOSIS — Z7189 Other specified counseling: Secondary | ICD-10-CM | POA: Diagnosis not present

## 2016-08-06 DIAGNOSIS — Z1389 Encounter for screening for other disorder: Secondary | ICD-10-CM | POA: Diagnosis not present

## 2016-08-06 DIAGNOSIS — R5383 Other fatigue: Secondary | ICD-10-CM | POA: Diagnosis not present

## 2016-08-06 DIAGNOSIS — N184 Chronic kidney disease, stage 4 (severe): Secondary | ICD-10-CM | POA: Diagnosis not present

## 2016-08-06 DIAGNOSIS — I10 Essential (primary) hypertension: Secondary | ICD-10-CM | POA: Diagnosis not present

## 2016-08-06 DIAGNOSIS — F419 Anxiety disorder, unspecified: Secondary | ICD-10-CM | POA: Diagnosis not present

## 2016-08-07 DIAGNOSIS — R5383 Other fatigue: Secondary | ICD-10-CM | POA: Diagnosis not present

## 2016-08-07 DIAGNOSIS — Z79899 Other long term (current) drug therapy: Secondary | ICD-10-CM | POA: Diagnosis not present

## 2016-08-07 DIAGNOSIS — E7800 Pure hypercholesterolemia, unspecified: Secondary | ICD-10-CM | POA: Diagnosis not present

## 2016-08-07 DIAGNOSIS — F419 Anxiety disorder, unspecified: Secondary | ICD-10-CM | POA: Diagnosis not present

## 2016-08-12 NOTE — Progress Notes (Signed)
Cardiology Office Note  Date: 08/14/2016   ID: Wanda [name], DOB 1940-11-25, MRN [id number]  PCP: Wanda Chroman, MD  Primary Cardiologist: Wanda Lesches, MD   Chief Complaint  Patient presents with  . PAF    History of Present Illness: Wanda [name] is a 76 y.o. female last seen in February. She presents for a follow-up visit. States that she has been frustrated with recent interactions per her long-term PCP office, is considering making a switch. She has had trouble with recurring leg swelling. No definite palpitations or chest pain. She states that she has been compliant with her medications. I note that she has been taken off HCTZ, presumably related to renal insufficiency. Creatinine had been as high as 2.1, most recently down to 1.4.  She continues on Coumadin with follow-up in the anticoagulation clinic. She does not report any bleeding problems.  Her last echocardiogram was in 2014 as noted below. We discussed obtaining an updated study.  Past Medical History:  Diagnosis Date  . Celiac disease   . COPD (chronic obstructive pulmonary disease) ([place])   . Essential hypertension, benign   . Goiter   . History of breast cancer    Status post mastectomy  . History of cardiac catheterization    No significant CAD 2006  . Mixed hyperlipidemia   . Paroxysmal atrial fibrillation (HCC)    Coumadin therapy  . PUD (peptic ulcer disease)   . Pulmonary hypertension ([place])   . Type 2 diabetes mellitus ([place])     Past Surgical History:  Procedure Laterality Date  . DILATION AND CURETTAGE OF UTERUS    . HYSTEROSCOPY W/ ENDOMETRIAL ABLATION     [employer]maChoice  . MASTECTOMY      Current Outpatient Prescriptions  Medication Sig Dispense Refill  . ALPRAZolam (XANAX) 1 MG tablet Take 1 mg by mouth 2 (two) times daily as needed for anxiety or sleep.     Marland Kitchen atorvastatin (LIPITOR) 10 MG tablet Take 10 mg by mouth every evening.     . baclofen (LIORESAL) 10 MG tablet Take 10 mg by  mouth 3 (three) times daily.    Marland Kitchen diltiazem (CARDIZEM CD) 240 MG 24 hr capsule Take 1 capsule (240 mg total) by mouth daily. 30 capsule 6  . glimepiride (AMARYL) 2 MG tablet Take 2 mg by mouth daily with breakfast.    . lisinopril (PRINIVIL,ZESTRIL) 20 MG tablet TAKE 1 TABLET DAILY 90 tablet 2  . sennosides-docusate sodium (SENOKOT-S) 8.6-50 MG tablet Take 1 tablet by mouth daily as needed for constipation.    . tamsulosin (FLOMAX) 0.4 MG CAPS capsule Take 0.4 mg by mouth daily. TAKE 30 MINUTES AFTER THE SAME MEAL EACH DAY  2  . trimethoprim (TRIMPEX) 100 MG tablet Take 100 mg by mouth daily.    Marland Kitchen warfarin (COUMADIN) 5 MG tablet take 1 and 1/2 tablets once daily or as directed PER COAGULATION CLINIC (Patient taking differently: Take 5 mg by mouth daily. ) 135 tablet 0  . furosemide (LASIX) 20 MG tablet Take 1 tablet (20 mg total) by mouth as needed (swelling). 30 tablet 3   No current facility-administered medications for this visit.    Allergies:  Gluten; Hydromorphone hcl; and Prednisone   Social History: The patient  reports that she quit smoking about 21 years ago. Her smoking use included Cigarettes. She has never used smokeless tobacco. She reports that she drinks alcohol. She reports that she does not use drugs.   ROS:  Please see  the history of present illness. Otherwise, complete review of systems is positive for intermittent leg swelling bilaterally.  All other systems are reviewed and negative.   Physical Exam: VS:  BP (!) 142/90   Pulse 63   Ht 5' 9" (1.753 m)   Wt 223 lb (101.2 kg)   SpO2 92%   BMI 32.93 kg/m , BMI Body mass index is 32.93 kg/m.  Wt Readings from Last 3 Encounters:  08/14/16 223 lb (101.2 kg)  08/05/16 222 lb (100.7 kg)  03/24/16 222 lb (100.7 kg)    No acute distress.  HEENT: Conjunctiva and lids normal, oropharynx clear.  Neck: Supple, no elevated JVP or carotid bruits, no thyromegaly.  Lungs: Clear to auscultation, diminished, nonlabored  breathing at rest.  Cardiac: Irregularly irregular, distant, no S3.  Abdomen: Soft, nontender, bowel sounds present.  Extremities: Mild bilateral lower leg edema, distal pulses 2+. Skin: Warm and dry. Musculoskeletal: No kyphosis. Neuropsychiatric: Alert and oriented 3, affect appropriate.  ECG: I personally reviewed the tracing from 08/05/2016 which showed rate-controlled atrial fibrillation with low voltage.  Recent Labwork:  July 2017: BUN 36, creatinine 1.7, potassium 4.4,  June 2018: cholesterol 112, triglycerides 111, HDL 42, LDL 48, TSH 0.49, BUN 17, creatinine 1.4, potassium 4.2, AST 13, ALT 16, Hgb 14.1, platelets 262  Other Studies Reviewed Today:  Echocardiogram 09/16/2012: Study Conclusions  - Left ventricle: The cavity size was normal. Wall thickness was increased in a pattern of moderate LVH. The estimated ejection fraction was 65%. Wall motion was normal; there were no regional wall motion abnormalities. - Aortic valve: Sclerosis without stenosis. No significant regurgitation. - Left atrium: The atrium was mildly to moderately dilated. - Right ventricle: The cavity size was normal. Systolic function was normal. - Right atrium: The atrium was mildly dilated.  Assessment and Plan:  1. Paroxysmal to persistent atrial fibrillation. Continue strategy of heart rate control and anticoagulation. She has been tolerating Cardizem CD and Coumadin long-term. Due for recheck INR today.  2. Intermittent bilateral leg edema. Plan to prescribe Lasix 20 mg to be used only as needed. Also elevate legs when able. We will obtain a follow-up echocardiogram in light of her history of pulmonary hypertension, reassess cardiac structure and function.  3. Chronic kidney disease, stage III based on most recent information. Recheck BMET in 1 month on intermittent Lasix. Last creatinine 1.4.  4. Essential hypertension. Also on lisinopril. No changes made today.  Current  medicines were reviewed with the patient today.   Orders Placed This Encounter  Procedures  . Basic Metabolic Panel (BMET)  . ECHOCARDIOGRAM COMPLETE    Disposition: Follow-up in 3 months, sooner if needed. She states that she plans to establish with a new PCP in the interim.  Signed, Satira Sark, MD, [facility] 08/14/2016 12:36 PM    [place] at [place], [place],  [address] Phone: [phone]; Fax: [phone]

## 2016-08-14 ENCOUNTER — Ambulatory Visit (INDEPENDENT_AMBULATORY_CARE_PROVIDER_SITE_OTHER): Payer: PPO | Admitting: Cardiology

## 2016-08-14 ENCOUNTER — Encounter: Payer: Self-pay | Admitting: Cardiology

## 2016-08-14 VITALS — BP 142/90 | HR 63 | Ht 69.0 in | Wt 223.0 lb

## 2016-08-14 DIAGNOSIS — I10 Essential (primary) hypertension: Secondary | ICD-10-CM

## 2016-08-14 DIAGNOSIS — N183 Chronic kidney disease, stage 3 unspecified: Secondary | ICD-10-CM

## 2016-08-14 DIAGNOSIS — Z5181 Encounter for therapeutic drug level monitoring: Secondary | ICD-10-CM

## 2016-08-14 DIAGNOSIS — R600 Localized edema: Secondary | ICD-10-CM | POA: Diagnosis not present

## 2016-08-14 DIAGNOSIS — I272 Pulmonary hypertension, unspecified: Secondary | ICD-10-CM | POA: Diagnosis not present

## 2016-08-14 DIAGNOSIS — I480 Paroxysmal atrial fibrillation: Secondary | ICD-10-CM

## 2016-08-14 DIAGNOSIS — I4891 Unspecified atrial fibrillation: Secondary | ICD-10-CM

## 2016-08-14 LAB — POCT INR: INR: 2

## 2016-08-14 MED ORDER — FUROSEMIDE 20 MG PO TABS: 20.0000 mg | ORAL_TABLET | ORAL | 3 refills | Status: DC | PRN

## 2016-08-14 NOTE — Patient Instructions (Signed)
Medication Instructions:  Your physician has recommended you make the following change in your medication:  BEGIN lasix 20 mg as needed for swelling  Labwork: BMET in 1 month   Testing/Procedures: Your physician has requested that you have an echocardiogram. Echocardiography is a painless test that uses sound waves to create images of your heart. It provides your doctor with information about the size and shape of your heart and how well your heart's chambers and valves are working. This procedure takes approximately one hour. There are no restrictions for this procedure.  Follow-Up: Your physician recommends that you schedule a follow-up appointment in: 3 MONTHS WITH DR. MCDOWELL  Any Other Special Instructions Will Be Listed Below (If Applicable).  If you need a refill on your cardiac medications before your next appointment, please call your pharmacy.

## 2016-08-15 ENCOUNTER — Ambulatory Visit (INDEPENDENT_AMBULATORY_CARE_PROVIDER_SITE_OTHER): Payer: PPO | Admitting: Interventional Cardiology

## 2016-08-15 DIAGNOSIS — Z5181 Encounter for therapeutic drug level monitoring: Secondary | ICD-10-CM

## 2016-08-15 DIAGNOSIS — I4891 Unspecified atrial fibrillation: Secondary | ICD-10-CM

## 2016-08-19 DIAGNOSIS — R339 Retention of urine, unspecified: Secondary | ICD-10-CM | POA: Diagnosis not present

## 2016-08-21 ENCOUNTER — Ambulatory Visit (INDEPENDENT_AMBULATORY_CARE_PROVIDER_SITE_OTHER): Payer: PPO

## 2016-08-21 ENCOUNTER — Ambulatory Visit (INDEPENDENT_AMBULATORY_CARE_PROVIDER_SITE_OTHER): Payer: PPO | Admitting: *Deleted

## 2016-08-21 ENCOUNTER — Other Ambulatory Visit: Payer: Self-pay

## 2016-08-21 DIAGNOSIS — I272 Pulmonary hypertension, unspecified: Secondary | ICD-10-CM

## 2016-08-21 DIAGNOSIS — Z5181 Encounter for therapeutic drug level monitoring: Secondary | ICD-10-CM

## 2016-08-21 LAB — POCT INR: INR: 2

## 2016-08-25 ENCOUNTER — Telehealth: Payer: Self-pay | Admitting: *Deleted

## 2016-08-25 NOTE — Telephone Encounter (Signed)
Patient informed and verbalized understanding of plan. Copy sent to PCP.

## 2016-08-25 NOTE — Telephone Encounter (Signed)
-----  Message from Satira Sark, MD sent at 08/21/2016  5:04 PM EDT ----- Results reviewed. LVEF remains normal at 55-60%. Moderate mitral regurgitation was noted and pulmonary pressures have increased compared to the prior assessment, now in the moderate range. Not clear that we need to get her back to see Dr. Haroldine Laws, let's see how she does with the recent medicine adjustments. Keep scheduled office follow-up. A copy of this test should be forwarded to Glenda Chroman, MD.

## 2016-08-28 DIAGNOSIS — E2839 Other primary ovarian failure: Secondary | ICD-10-CM | POA: Diagnosis not present

## 2016-09-09 DIAGNOSIS — R339 Retention of urine, unspecified: Secondary | ICD-10-CM | POA: Diagnosis not present

## 2016-09-11 ENCOUNTER — Ambulatory Visit (INDEPENDENT_AMBULATORY_CARE_PROVIDER_SITE_OTHER): Payer: PPO | Admitting: *Deleted

## 2016-09-11 DIAGNOSIS — I4891 Unspecified atrial fibrillation: Secondary | ICD-10-CM | POA: Diagnosis not present

## 2016-09-11 DIAGNOSIS — Z5181 Encounter for therapeutic drug level monitoring: Secondary | ICD-10-CM

## 2016-09-11 LAB — POCT INR: INR: 2.8

## 2016-09-19 DIAGNOSIS — N183 Chronic kidney disease, stage 3 (moderate): Secondary | ICD-10-CM | POA: Diagnosis not present

## 2016-09-19 DIAGNOSIS — Z6833 Body mass index (BMI) 33.0-33.9, adult: Secondary | ICD-10-CM | POA: Diagnosis not present

## 2016-09-19 DIAGNOSIS — J449 Chronic obstructive pulmonary disease, unspecified: Secondary | ICD-10-CM | POA: Diagnosis not present

## 2016-09-19 DIAGNOSIS — I10 Essential (primary) hypertension: Secondary | ICD-10-CM | POA: Diagnosis not present

## 2016-09-19 DIAGNOSIS — E7800 Pure hypercholesterolemia, unspecified: Secondary | ICD-10-CM | POA: Diagnosis not present

## 2016-09-19 DIAGNOSIS — F419 Anxiety disorder, unspecified: Secondary | ICD-10-CM | POA: Diagnosis not present

## 2016-09-19 DIAGNOSIS — Z713 Dietary counseling and surveillance: Secondary | ICD-10-CM | POA: Diagnosis not present

## 2016-09-19 DIAGNOSIS — E1122 Type 2 diabetes mellitus with diabetic chronic kidney disease: Secondary | ICD-10-CM | POA: Diagnosis not present

## 2016-09-19 DIAGNOSIS — Z299 Encounter for prophylactic measures, unspecified: Secondary | ICD-10-CM | POA: Diagnosis not present

## 2016-09-30 ENCOUNTER — Ambulatory Visit (INDEPENDENT_AMBULATORY_CARE_PROVIDER_SITE_OTHER): Payer: PPO | Admitting: Urology

## 2016-09-30 DIAGNOSIS — R3914 Feeling of incomplete bladder emptying: Secondary | ICD-10-CM | POA: Diagnosis not present

## 2016-09-30 DIAGNOSIS — N952 Postmenopausal atrophic vaginitis: Secondary | ICD-10-CM

## 2016-10-09 ENCOUNTER — Ambulatory Visit (INDEPENDENT_AMBULATORY_CARE_PROVIDER_SITE_OTHER): Payer: PPO | Admitting: *Deleted

## 2016-10-09 DIAGNOSIS — Z5181 Encounter for therapeutic drug level monitoring: Secondary | ICD-10-CM

## 2016-10-09 DIAGNOSIS — I4891 Unspecified atrial fibrillation: Secondary | ICD-10-CM | POA: Diagnosis not present

## 2016-10-09 LAB — POCT INR: INR: 2.6

## 2016-10-16 DIAGNOSIS — R339 Retention of urine, unspecified: Secondary | ICD-10-CM | POA: Diagnosis not present

## 2016-11-06 ENCOUNTER — Ambulatory Visit (INDEPENDENT_AMBULATORY_CARE_PROVIDER_SITE_OTHER): Payer: PPO | Admitting: *Deleted

## 2016-11-06 DIAGNOSIS — Z5181 Encounter for therapeutic drug level monitoring: Secondary | ICD-10-CM

## 2016-11-06 DIAGNOSIS — I4891 Unspecified atrial fibrillation: Secondary | ICD-10-CM | POA: Diagnosis not present

## 2016-11-06 LAB — POCT INR: INR: 3.3

## 2016-11-19 ENCOUNTER — Other Ambulatory Visit: Payer: Self-pay | Admitting: Cardiology

## 2016-11-20 ENCOUNTER — Ambulatory Visit: Payer: PPO

## 2016-11-20 ENCOUNTER — Encounter: Payer: Self-pay | Admitting: Cardiology

## 2016-11-20 ENCOUNTER — Ambulatory Visit (INDEPENDENT_AMBULATORY_CARE_PROVIDER_SITE_OTHER): Payer: PPO | Admitting: Cardiology

## 2016-11-20 VITALS — BP 160/100 | HR 65 | Ht 69.0 in | Wt 226.0 lb

## 2016-11-20 DIAGNOSIS — I10 Essential (primary) hypertension: Secondary | ICD-10-CM | POA: Diagnosis not present

## 2016-11-20 DIAGNOSIS — I4819 Other persistent atrial fibrillation: Secondary | ICD-10-CM

## 2016-11-20 DIAGNOSIS — I481 Persistent atrial fibrillation: Secondary | ICD-10-CM

## 2016-11-20 DIAGNOSIS — R600 Localized edema: Secondary | ICD-10-CM

## 2016-11-20 DIAGNOSIS — I340 Nonrheumatic mitral (valve) insufficiency: Secondary | ICD-10-CM | POA: Diagnosis not present

## 2016-11-20 MED ORDER — LISINOPRIL 20 MG PO TABS: 30.0000 mg | ORAL_TABLET | Freq: Every day | ORAL | 1 refills | Status: DC

## 2016-11-20 NOTE — Patient Instructions (Signed)
Medication Instructions:  Your physician has recommended you make the following change in your medication:  Increase lisinopril to 30 mg daily Please continue all other medications as prescribed  Labwork: BMET- In 2 weeks Orders given today   Testing/Procedures: NONE  Follow-Up: Your physician wants you to follow-up in: [place]. You will receive a reminder letter in the mail two months in advance. If you don't receive a letter, please call our office to schedule the follow-up appointment.  Any Other Special Instructions Will Be Listed Below (If Applicable).  If you need a refill on your cardiac medications before your next appointment, please call your pharmacy.

## 2016-11-20 NOTE — Progress Notes (Signed)
Cardiology Office Note  Date: 11/20/2016   ID: Wanda [name], DOB November 21, 1940, MRN [id number]  PCP: Glenda Chroman, MD  Primary Cardiologist: Rozann Lesches, MD   Chief Complaint  Patient presents with  . Atrial Fibrillation    History of Present Illness: Wanda [name] is a 76 y.o. female last seen in July. She presents for a routine follow-up visit. Since last encounter she states that her lower leg and foot swelling has been better. She uses Lasix once or twice a week. She reports no increasing dyspnea on exertion, no orthopnea or PND.  Recent follow-up echocardiogram done in July revealed LVEF 55-60% with moderate mitral regurgitation and PASP 50 mmHg. We went over the results today.  She continues to follow in the anticoagulation clinic on Coumadin. She reports no bleeding problems.  Blood pressure was elevated today, she states that this has been a general trend in the last several months.  Past Medical History:  Diagnosis Date  . Celiac disease   . COPD (chronic obstructive pulmonary disease) ([place]   . Essential hypertension, benign   . Goiter   . History of breast cancer    Status post mastectomy  . History of cardiac catheterization    No significant CAD 2006  . Mixed hyperlipidemia   . Paroxysmal atrial fibrillation (HCC)    Coumadin therapy  . PUD (peptic ulcer disease)   . Pulmonary hypertension ([place])   . Type 2 diabetes mellitus ([place])     Past Surgical History:  Procedure Laterality Date  . DILATION AND CURETTAGE OF UTERUS    . HYSTEROSCOPY W/ ENDOMETRIAL ABLATION     [employer]maChoice  . MASTECTOMY      Current Outpatient Prescriptions  Medication Sig Dispense Refill  . ALPRAZolam (XANAX) 1 MG tablet Take 1 mg by mouth 2 (two) times daily as needed for anxiety or sleep.     Marland Kitchen atorvastatin (LIPITOR) 10 MG tablet Take 10 mg by mouth every evening.     . baclofen (LIORESAL) 10 MG tablet Take 10 mg by mouth 3 (three) times daily.    Marland Kitchen diltiazem (CARDIZEM  CD) 240 MG 24 hr capsule TAKE ONE CAPSULE BY MOUTH DAILY 30 capsule 3  . furosemide (LASIX) 20 MG tablet Take 1 tablet (20 mg total) by mouth as needed (swelling). 30 tablet 3  . glimepiride (AMARYL) 2 MG tablet Take 2 mg by mouth daily with breakfast.    . sennosides-docusate sodium (SENOKOT-S) 8.6-50 MG tablet Take 1 tablet by mouth daily as needed for constipation.    . tamsulosin (FLOMAX) 0.4 MG CAPS capsule Take 0.4 mg by mouth daily. TAKE 30 MINUTES AFTER THE SAME MEAL EACH DAY  2  . trimethoprim (TRIMPEX) 100 MG tablet Take 100 mg by mouth daily.    Marland Kitchen warfarin (COUMADIN) 5 MG tablet take 1 and 1/2 tablets once daily or as directed PER COAGULATION CLINIC (Patient taking differently: Take 5 mg by mouth daily. ) 135 tablet 0  . lisinopril (PRINIVIL,ZESTRIL) 20 MG tablet Take 1.5 tablets (30 mg total) by mouth daily. 135 tablet 1   No current facility-administered medications for this visit.    Allergies:  Gluten; Hydromorphone hcl; and Prednisone   Social History: The patient  reports that she quit smoking about 21 years ago. Her smoking use included Cigarettes. She has never used smokeless tobacco. She reports that she drinks alcohol. She reports that she does not use drugs.   ROS:  Please see the history of present illness.  Otherwise, complete review of systems is positive for none.  All other systems are reviewed and negative.   Physical Exam: VS:  BP (!) 160/100   Pulse 65   Ht _0  (1.753 m)   Wt 226 lb (102.5 kg)   SpO2 92%   BMI 33.37 kg/m , BMI Body mass index is 33.37 kg/m.  Wt Readings from Last 3 Encounters:  11/20/16 226 lb (102.5 kg)  08/14/16 223 lb (101.2 kg)  08/05/16 222 lb (100.7 kg)    General: Elderly woman, appears comfortable at rest. HEENT: Conjunctiva and lids normal, oropharynx clear. Neck: Supple, no elevated JVP or carotid bruits, no thyromegaly. Lungs: Clear to auscultation, nonlabored breathing at rest. Cardiac: Irregularly irregular, no S3, soft  apical systolic murmur, no pericardial rub. Abdomen: Soft, nontender, bowel sounds present, no guarding or rebound. Extremities: Trace ankle edema, distal pulses 2+. Skin: Warm and dry. Musculoskeletal: No kyphosis. Neuropsychiatric: Alert and oriented x3, affect grossly appropriate.  ECG: I personally reviewed the tracing from 08/05/2016 which showed rate-controlled atrial fibrillation with low voltage. Decreased R wave progression.  Recent Labwork:  June 2018: cholesterol 112, triglycerides 111, HDL 42, LDL 48, TSH 0.49, BUN 17, creatinine 1.4, potassium 4.2, AST 13, ALT 16, Hgb 14.1, platelets 262  Other Studies Reviewed Today:  Echocardiogram 08/21/2016: Study Conclusions  - Procedure narrative: Transthoracic echocardiography. Image   quality was suboptimal. The study was technically difficult, as a   result of poor acoustic windows and breast implants. - Left ventricle: The cavity size was normal. Wall thickness was   increased in a pattern of mild LVH. Systolic function was normal.   The estimated ejection fraction was in the range of 55% to 60%.   Images were inadequate for LV wall motion assessment. However,   there was no gross regional variation on available images. The   study was not technically sufficient to allow evaluation of LV   diastolic dysfunction due to atrial fibrillation. - Mitral valve: There was moderate regurgitation, with multiple   jets. - Left atrium: The atrium was mildly to moderately dilated. - Right atrium: The atrium was mildly dilated. - Tricuspid valve: There was moderate regurgitation. - Pulmonary arteries: PA peak pressure: 50 mm Hg (S).  Assessment and Plan:  1. Persistent atrial fibrillation. Continue with current medical therapy including Cardizem CD for heart rate control and Coumadin for stroke prophylaxis. Keep follow-up in the anticoagulation clinic.  2. Intermittent lower leg and foot edema, better with as needed use of Lasix.  3.  Moderate mitral regurgitation with moderate pulmonary hypertension. Recently evaluated by follow-up echocardiogram.  4. Essential hypertension, blood pressure is 1 optimally controlled. Increase lisinopril to 30 g daily. Follow-up BMET in 2 weeks. She does have renal insufficiency at baseline.  Current medicines were reviewed with the patient today.   Orders Placed This Encounter  Procedures  . Basic Metabolic Panel (BMET)    Disposition: Follow-up in 6 months, sooner if needed.  Signed, Satira Sark, MD, [facility] 11/20/2016 9:51 AM    [facility] at [place], [place], [place] [address] Phone: [phone]; Fax: [phone]

## 2016-12-04 ENCOUNTER — Ambulatory Visit (INDEPENDENT_AMBULATORY_CARE_PROVIDER_SITE_OTHER): Payer: PPO | Admitting: *Deleted

## 2016-12-04 DIAGNOSIS — I4891 Unspecified atrial fibrillation: Secondary | ICD-10-CM

## 2016-12-04 DIAGNOSIS — Z5181 Encounter for therapeutic drug level monitoring: Secondary | ICD-10-CM | POA: Diagnosis not present

## 2016-12-04 LAB — POCT INR: INR: 2.5

## 2016-12-05 DIAGNOSIS — R339 Retention of urine, unspecified: Secondary | ICD-10-CM | POA: Diagnosis not present

## 2016-12-11 ENCOUNTER — Telehealth: Payer: Self-pay

## 2016-12-11 DIAGNOSIS — I481 Persistent atrial fibrillation: Secondary | ICD-10-CM | POA: Diagnosis not present

## 2016-12-11 NOTE — Telephone Encounter (Signed)
Patient notified. Routed to PCP

## 2016-12-11 NOTE — Telephone Encounter (Signed)
-----  Message from Satira Sark, MD sent at 12/11/2016  2:46 PM EDT ----- Results reviewed.  Creatinine is 1.5, previously 1.4.  Potassium mildly elevated at 5.2.  We might be limited in how high we can advance her ACE inhibitor.  Continue to follow blood pressure trend. A copy of this test should be forwarded to Glenda Chroman, MD.

## 2016-12-26 DIAGNOSIS — E1165 Type 2 diabetes mellitus with hyperglycemia: Secondary | ICD-10-CM | POA: Diagnosis not present

## 2016-12-26 DIAGNOSIS — J449 Chronic obstructive pulmonary disease, unspecified: Secondary | ICD-10-CM | POA: Diagnosis not present

## 2016-12-26 DIAGNOSIS — Z299 Encounter for prophylactic measures, unspecified: Secondary | ICD-10-CM | POA: Diagnosis not present

## 2016-12-26 DIAGNOSIS — Z6832 Body mass index (BMI) 32.0-32.9, adult: Secondary | ICD-10-CM | POA: Diagnosis not present

## 2016-12-26 DIAGNOSIS — I270 Primary pulmonary hypertension: Secondary | ICD-10-CM | POA: Diagnosis not present

## 2017-01-06 ENCOUNTER — Ambulatory Visit (INDEPENDENT_AMBULATORY_CARE_PROVIDER_SITE_OTHER): Payer: PPO | Admitting: *Deleted

## 2017-01-06 DIAGNOSIS — Z5181 Encounter for therapeutic drug level monitoring: Secondary | ICD-10-CM | POA: Diagnosis not present

## 2017-01-06 DIAGNOSIS — I4891 Unspecified atrial fibrillation: Secondary | ICD-10-CM

## 2017-01-06 LAB — POCT INR: INR: 2.3

## 2017-01-07 DIAGNOSIS — R339 Retention of urine, unspecified: Secondary | ICD-10-CM | POA: Diagnosis not present

## 2017-02-04 DIAGNOSIS — R339 Retention of urine, unspecified: Secondary | ICD-10-CM | POA: Diagnosis not present

## 2017-02-11 DIAGNOSIS — I10 Essential (primary) hypertension: Secondary | ICD-10-CM | POA: Diagnosis not present

## 2017-02-11 DIAGNOSIS — E119 Type 2 diabetes mellitus without complications: Secondary | ICD-10-CM | POA: Diagnosis not present

## 2017-02-11 DIAGNOSIS — Z7901 Long term (current) use of anticoagulants: Secondary | ICD-10-CM | POA: Diagnosis not present

## 2017-02-11 DIAGNOSIS — R040 Epistaxis: Secondary | ICD-10-CM | POA: Diagnosis not present

## 2017-02-11 DIAGNOSIS — Z79899 Other long term (current) drug therapy: Secondary | ICD-10-CM | POA: Diagnosis not present

## 2017-02-12 DIAGNOSIS — Z79899 Other long term (current) drug therapy: Secondary | ICD-10-CM | POA: Diagnosis not present

## 2017-02-12 DIAGNOSIS — E119 Type 2 diabetes mellitus without complications: Secondary | ICD-10-CM | POA: Diagnosis not present

## 2017-02-12 DIAGNOSIS — R040 Epistaxis: Secondary | ICD-10-CM | POA: Diagnosis not present

## 2017-02-12 DIAGNOSIS — I10 Essential (primary) hypertension: Secondary | ICD-10-CM | POA: Diagnosis not present

## 2017-02-12 DIAGNOSIS — Z7901 Long term (current) use of anticoagulants: Secondary | ICD-10-CM | POA: Diagnosis not present

## 2017-02-13 ENCOUNTER — Ambulatory Visit (INDEPENDENT_AMBULATORY_CARE_PROVIDER_SITE_OTHER): Payer: PPO | Admitting: *Deleted

## 2017-02-13 DIAGNOSIS — Z5181 Encounter for therapeutic drug level monitoring: Secondary | ICD-10-CM

## 2017-02-13 DIAGNOSIS — J449 Chronic obstructive pulmonary disease, unspecified: Secondary | ICD-10-CM | POA: Diagnosis not present

## 2017-02-13 DIAGNOSIS — R040 Epistaxis: Secondary | ICD-10-CM | POA: Diagnosis not present

## 2017-02-13 DIAGNOSIS — I4891 Unspecified atrial fibrillation: Secondary | ICD-10-CM | POA: Diagnosis not present

## 2017-02-13 DIAGNOSIS — Z299 Encounter for prophylactic measures, unspecified: Secondary | ICD-10-CM | POA: Diagnosis not present

## 2017-02-13 DIAGNOSIS — E1122 Type 2 diabetes mellitus with diabetic chronic kidney disease: Secondary | ICD-10-CM | POA: Diagnosis not present

## 2017-02-13 DIAGNOSIS — Z6831 Body mass index (BMI) 31.0-31.9, adult: Secondary | ICD-10-CM | POA: Diagnosis not present

## 2017-02-13 DIAGNOSIS — I270 Primary pulmonary hypertension: Secondary | ICD-10-CM | POA: Diagnosis not present

## 2017-02-13 DIAGNOSIS — N184 Chronic kidney disease, stage 4 (severe): Secondary | ICD-10-CM | POA: Diagnosis not present

## 2017-02-13 LAB — POCT INR: INR: 1.6

## 2017-02-13 NOTE — Patient Instructions (Signed)
Decrease coumadin to 1 tablet daily until next INR check due to nose bleed. Recheck in 1 week

## 2017-02-19 ENCOUNTER — Ambulatory Visit (INDEPENDENT_AMBULATORY_CARE_PROVIDER_SITE_OTHER): Payer: PPO | Admitting: *Deleted

## 2017-02-19 DIAGNOSIS — Z5181 Encounter for therapeutic drug level monitoring: Secondary | ICD-10-CM | POA: Diagnosis not present

## 2017-02-19 DIAGNOSIS — I4891 Unspecified atrial fibrillation: Secondary | ICD-10-CM

## 2017-02-19 LAB — POCT INR: INR: 1.6

## 2017-02-19 NOTE — Patient Instructions (Signed)
Increase coumadin to 1 tablet daily except 1 1/2 tablets on Mondays and Thursdays. Recheck in 10 days

## 2017-02-27 DIAGNOSIS — R339 Retention of urine, unspecified: Secondary | ICD-10-CM | POA: Diagnosis not present

## 2017-03-03 ENCOUNTER — Ambulatory Visit (INDEPENDENT_AMBULATORY_CARE_PROVIDER_SITE_OTHER): Payer: PPO | Admitting: *Deleted

## 2017-03-03 DIAGNOSIS — Z5181 Encounter for therapeutic drug level monitoring: Secondary | ICD-10-CM | POA: Diagnosis not present

## 2017-03-03 DIAGNOSIS — I4891 Unspecified atrial fibrillation: Secondary | ICD-10-CM | POA: Diagnosis not present

## 2017-03-03 LAB — POCT INR: INR: 3

## 2017-03-03 NOTE — Patient Instructions (Signed)
Take coumadin 1/2 tablet tonight then decrease dose to 1 tablet daily except 1 1/2 tablets on Mondays Recheck in 2 weeks

## 2017-03-18 ENCOUNTER — Other Ambulatory Visit: Payer: Self-pay | Admitting: Cardiology

## 2017-03-24 ENCOUNTER — Ambulatory Visit: Payer: PPO | Admitting: *Deleted

## 2017-03-24 DIAGNOSIS — Z5181 Encounter for therapeutic drug level monitoring: Secondary | ICD-10-CM

## 2017-03-24 DIAGNOSIS — I4891 Unspecified atrial fibrillation: Secondary | ICD-10-CM

## 2017-03-24 LAB — POCT INR: INR: 2.3

## 2017-03-24 NOTE — Patient Instructions (Signed)
Continue coumadin 1 tablet daily except 1 1/2 tablets on Thursdays Recheck in 4 weeks 

## 2017-03-26 DIAGNOSIS — R339 Retention of urine, unspecified: Secondary | ICD-10-CM | POA: Diagnosis not present

## 2017-04-02 DIAGNOSIS — N184 Chronic kidney disease, stage 4 (severe): Secondary | ICD-10-CM | POA: Diagnosis not present

## 2017-04-02 DIAGNOSIS — Z299 Encounter for prophylactic measures, unspecified: Secondary | ICD-10-CM | POA: Diagnosis not present

## 2017-04-02 DIAGNOSIS — J449 Chronic obstructive pulmonary disease, unspecified: Secondary | ICD-10-CM | POA: Diagnosis not present

## 2017-04-02 DIAGNOSIS — E1122 Type 2 diabetes mellitus with diabetic chronic kidney disease: Secondary | ICD-10-CM | POA: Diagnosis not present

## 2017-04-02 DIAGNOSIS — Z6832 Body mass index (BMI) 32.0-32.9, adult: Secondary | ICD-10-CM | POA: Diagnosis not present

## 2017-04-02 DIAGNOSIS — E1165 Type 2 diabetes mellitus with hyperglycemia: Secondary | ICD-10-CM | POA: Diagnosis not present

## 2017-04-02 DIAGNOSIS — I270 Primary pulmonary hypertension: Secondary | ICD-10-CM | POA: Diagnosis not present

## 2017-04-21 DIAGNOSIS — F419 Anxiety disorder, unspecified: Secondary | ICD-10-CM | POA: Diagnosis not present

## 2017-04-21 DIAGNOSIS — Z299 Encounter for prophylactic measures, unspecified: Secondary | ICD-10-CM | POA: Diagnosis not present

## 2017-04-21 DIAGNOSIS — M545 Low back pain: Secondary | ICD-10-CM | POA: Diagnosis not present

## 2017-04-21 DIAGNOSIS — E7800 Pure hypercholesterolemia, unspecified: Secondary | ICD-10-CM | POA: Diagnosis not present

## 2017-04-21 DIAGNOSIS — E1165 Type 2 diabetes mellitus with hyperglycemia: Secondary | ICD-10-CM | POA: Diagnosis not present

## 2017-04-21 DIAGNOSIS — E1122 Type 2 diabetes mellitus with diabetic chronic kidney disease: Secondary | ICD-10-CM | POA: Diagnosis not present

## 2017-04-21 DIAGNOSIS — J449 Chronic obstructive pulmonary disease, unspecified: Secondary | ICD-10-CM | POA: Diagnosis not present

## 2017-04-21 DIAGNOSIS — G4700 Insomnia, unspecified: Secondary | ICD-10-CM | POA: Diagnosis not present

## 2017-04-21 DIAGNOSIS — I10 Essential (primary) hypertension: Secondary | ICD-10-CM | POA: Diagnosis not present

## 2017-04-21 DIAGNOSIS — Z6822 Body mass index (BMI) 22.0-22.9, adult: Secondary | ICD-10-CM | POA: Diagnosis not present

## 2017-04-21 DIAGNOSIS — N184 Chronic kidney disease, stage 4 (severe): Secondary | ICD-10-CM | POA: Diagnosis not present

## 2017-05-04 DIAGNOSIS — M25551 Pain in right hip: Secondary | ICD-10-CM | POA: Diagnosis not present

## 2017-05-04 DIAGNOSIS — R300 Dysuria: Secondary | ICD-10-CM | POA: Diagnosis not present

## 2017-05-04 DIAGNOSIS — M545 Low back pain: Secondary | ICD-10-CM | POA: Diagnosis not present

## 2017-05-04 DIAGNOSIS — I700 Atherosclerosis of aorta: Secondary | ICD-10-CM | POA: Diagnosis not present

## 2017-05-04 DIAGNOSIS — Z299 Encounter for prophylactic measures, unspecified: Secondary | ICD-10-CM | POA: Diagnosis not present

## 2017-05-04 DIAGNOSIS — M5136 Other intervertebral disc degeneration, lumbar region: Secondary | ICD-10-CM | POA: Diagnosis not present

## 2017-05-04 DIAGNOSIS — J449 Chronic obstructive pulmonary disease, unspecified: Secondary | ICD-10-CM | POA: Diagnosis not present

## 2017-05-04 DIAGNOSIS — M160 Bilateral primary osteoarthritis of hip: Secondary | ICD-10-CM | POA: Diagnosis not present

## 2017-05-04 DIAGNOSIS — M1611 Unilateral primary osteoarthritis, right hip: Secondary | ICD-10-CM | POA: Diagnosis not present

## 2017-05-04 DIAGNOSIS — I270 Primary pulmonary hypertension: Secondary | ICD-10-CM | POA: Diagnosis not present

## 2017-05-04 DIAGNOSIS — N184 Chronic kidney disease, stage 4 (severe): Secondary | ICD-10-CM | POA: Diagnosis not present

## 2017-05-04 DIAGNOSIS — Z6832 Body mass index (BMI) 32.0-32.9, adult: Secondary | ICD-10-CM | POA: Diagnosis not present

## 2017-05-05 ENCOUNTER — Ambulatory Visit (INDEPENDENT_AMBULATORY_CARE_PROVIDER_SITE_OTHER): Payer: PPO | Admitting: *Deleted

## 2017-05-05 DIAGNOSIS — I4891 Unspecified atrial fibrillation: Secondary | ICD-10-CM | POA: Diagnosis not present

## 2017-05-05 DIAGNOSIS — Z5181 Encounter for therapeutic drug level monitoring: Secondary | ICD-10-CM | POA: Diagnosis not present

## 2017-05-05 DIAGNOSIS — R339 Retention of urine, unspecified: Secondary | ICD-10-CM | POA: Diagnosis not present

## 2017-05-05 LAB — POCT INR: INR: 2.3

## 2017-05-05 NOTE — Patient Instructions (Signed)
Started on Cipro bid x 5 days yesterday for kidney infection  Take coumadin 1 tablet daily till finished with Cipro then resume 1 tablet daily except 1 1/2 tablets on Thursdays Recheck in 4 weeks

## 2017-05-27 NOTE — Progress Notes (Signed)
Cardiology Office Note  Date: 05/28/2017   ID: Wanda [name], DOB 02-15-1940, MRN [id number]  PCP: Glenda Chroman, MD  Primary Cardiologist: Rozann Lesches, MD   Chief Complaint  Patient presents with  . Atrial Fibrillation    History of Present Illness: Wanda [name] is a 77 y.o. female last seen in October 2018.  She presents for a routine visit.  Reports no chest pain or palpitations and states that she has been taking her medications regularly.  She continues on Coumadin with follow-up in the anticoagulation clinic.  INR today was 2.0.  She reports no spontaneous bleeding problems.  At the last visit lisinopril was increased to 30 mg daily.  She has tolerated this change although blood pressure remains elevated.  I did go over her interval lab work as outlined below.  She states that intermittent leg swelling has improved with as needed use of Lasix.  She does not take this daily.  Past Medical History:  Diagnosis Date  . Celiac disease   . COPD (chronic obstructive pulmonary disease) ([place])   . Essential hypertension, benign   . Goiter   . History of breast cancer    Status post mastectomy  . History of cardiac catheterization    No significant CAD 2006  . Mixed hyperlipidemia   . Paroxysmal atrial fibrillation (HCC)    Coumadin therapy  . PUD (peptic ulcer disease)   . Pulmonary hypertension ([place])   . Type 2 diabetes mellitus ([place])     Past Surgical History:  Procedure Laterality Date  . DILATION AND CURETTAGE OF UTERUS    . HYSTEROSCOPY W/ ENDOMETRIAL ABLATION     [employer]maChoice  . MASTECTOMY      Current Outpatient Medications  Medication Sig Dispense Refill  . ALPRAZolam (XANAX) 1 MG tablet Take 1 mg by mouth 2 (two) times daily as needed for anxiety or sleep.     Marland Kitchen atorvastatin (LIPITOR) 10 MG tablet Take 10 mg by mouth every evening.     . baclofen (LIORESAL) 10 MG tablet Take 10 mg by mouth 3 (three) times daily. Rarely takes.    Marland Kitchen diltiazem  (CARDIZEM CD) 240 MG 24 hr capsule TAKE ONE CAPSULE BY MOUTH DAILY 30 capsule 3  . furosemide (LASIX) 20 MG tablet Take 1 tablet (20 mg total) by mouth as needed (swelling). (Patient taking differently: Take 20 mg by mouth as needed (swelling). Taking every other day) 30 tablet 3  . glimepiride (AMARYL) 2 MG tablet Take 2 mg by mouth daily with breakfast.    . lisinopril (PRINIVIL,ZESTRIL) 20 MG tablet Take 1 tablet (20 mg total) by mouth 2 (two) times daily. 180 tablet 3  . sennosides-docusate sodium (SENOKOT-S) 8.6-50 MG tablet Take 1 tablet by mouth daily as needed for constipation.    . tamsulosin (FLOMAX) 0.4 MG CAPS capsule Take 0.4 mg by mouth daily. TAKE 30 MINUTES AFTER THE SAME MEAL EACH DAY  2  . warfarin (COUMADIN) 5 MG tablet take 1 and 1/2 tablets once daily or as directed PER COAGULATION CLINIC (Patient taking differently: Take 5 mg by mouth daily. ) 135 tablet 0   No current facility-administered medications for this visit.    Allergies:  Gluten; Hydromorphone hcl; and Prednisone   Social History: The patient  reports that she quit smoking about 22 years ago. Her smoking use included cigarettes. She has never used smokeless tobacco. She reports that she drinks alcohol. She reports that she does not use drugs.  ROS:  Please see the history of present illness. Otherwise, complete review of systems is positive for none.  All other systems are reviewed and negative.   Physical Exam: VS:  BP (!) 157/100   Pulse 82   Ht _0  (1.727 m)   Wt 217 lb (98.4 kg)   SpO2 94%   BMI 32.99 kg/m , BMI Body mass index is 32.99 kg/m.  Wt Readings from Last 3 Encounters:  05/28/17 217 lb (98.4 kg)  11/20/16 226 lb (102.5 kg)  08/14/16 223 lb (101.2 kg)    General: Elderly woman, appears comfortable at rest. HEENT: Conjunctiva and lids normal, oropharynx clear. Neck: Supple, no elevated JVP or carotid bruits, no thyromegaly. Lungs: Clear to auscultation, nonlabored breathing at  rest. Cardiac: Regular rate and rhythm, no S3, soft systolic murmur. Abdomen: Soft, nontender, bowel sounds present. Extremities: Trace right edema, distal pulses 2+. Skin: Warm and dry. Musculoskeletal: No kyphosis. Neuropsychiatric: Alert and oriented x3, affect grossly appropriate.  ECG: I personally reviewed the tracing from 08/05/2016 which showed rate controlled atrial fibrillation with poor R wave progression and low voltage.  Recent Labwork:  November 2018: BUN 19, creatinine 1.52, potassium 5.2  Other Studies Reviewed Today:  Echocardiogram 08/21/2016: Study Conclusions  - Procedure narrative: Transthoracic echocardiography. Image quality was suboptimal. The study was technically difficult, as a result of poor acoustic windows and breast implants. - Left ventricle: The cavity size was normal. Wall thickness was increased in a pattern of mild LVH. Systolic function was normal. The estimated ejection fraction was in the range of 55% to 60%. Images were inadequate for LV wall motion assessment. However, there was no gross regional variation on available images. The study was not technically sufficient to allow evaluation of LV diastolic dysfunction due to atrial fibrillation. - Mitral valve: There was moderate regurgitation, with multiple jets. - Left atrium: The atrium was mildly to moderately dilated. - Right atrium: The atrium was mildly dilated. - Tricuspid valve: There was moderate regurgitation. - Pulmonary arteries: PA peak pressure: 50 mm Hg (S).   Assessment and Plan:  1.  Paroxysmal to persistent atrial fibrillation.  She remains symptomatically stable and has adequate heart rate control on current dose of Cardizem CD.  Continue Coumadin for stroke prophylaxis with follow-up in the anticoagulation clinic.  INR is therapeutic today.  2.  Essential hypertension.  Blood pressure still not optimally controlled.  We will try and increase lisinopril  to 20 mg twice daily but she will need follow-up BMET to reassess renal insufficiency and potassium level.  3.  Intermittent ankle and leg edema, better with as needed use of Lasix.  4.  Moderate mitral regurgitation and secondary pulmonary hypertension.  Current medicines were reviewed with the patient today.   Orders Placed This Encounter  Procedures  . Basic metabolic panel    Disposition: Follow-up in 6 months.  Signed, Satira Sark, MD, [facility] 05/28/2017 10:57 AM    [place] at [place], [place], [place] [address] Phone: [phone]; Fax: [phone]

## 2017-05-28 ENCOUNTER — Ambulatory Visit (INDEPENDENT_AMBULATORY_CARE_PROVIDER_SITE_OTHER): Payer: PPO | Admitting: Cardiology

## 2017-05-28 ENCOUNTER — Ambulatory Visit (INDEPENDENT_AMBULATORY_CARE_PROVIDER_SITE_OTHER): Payer: PPO | Admitting: *Deleted

## 2017-05-28 ENCOUNTER — Encounter: Payer: Self-pay | Admitting: Cardiology

## 2017-05-28 VITALS — BP 157/100 | HR 82 | Ht 68.0 in | Wt 217.0 lb

## 2017-05-28 DIAGNOSIS — I340 Nonrheumatic mitral (valve) insufficiency: Secondary | ICD-10-CM | POA: Diagnosis not present

## 2017-05-28 DIAGNOSIS — I4891 Unspecified atrial fibrillation: Secondary | ICD-10-CM | POA: Diagnosis not present

## 2017-05-28 DIAGNOSIS — I10 Essential (primary) hypertension: Secondary | ICD-10-CM

## 2017-05-28 DIAGNOSIS — Z5181 Encounter for therapeutic drug level monitoring: Secondary | ICD-10-CM | POA: Diagnosis not present

## 2017-05-28 DIAGNOSIS — R600 Localized edema: Secondary | ICD-10-CM | POA: Diagnosis not present

## 2017-05-28 DIAGNOSIS — I4819 Other persistent atrial fibrillation: Secondary | ICD-10-CM

## 2017-05-28 DIAGNOSIS — I481 Persistent atrial fibrillation: Secondary | ICD-10-CM | POA: Diagnosis not present

## 2017-05-28 LAB — POCT INR: INR: 2

## 2017-05-28 MED ORDER — LISINOPRIL 20 MG PO TABS: 20.0000 mg | ORAL_TABLET | Freq: Two times a day (BID) | ORAL | 3 refills | Status: DC

## 2017-05-28 NOTE — Patient Instructions (Signed)
Medication Instructions:   Increase Lisinopril to 50m twice a day.  Continue all other medications.    Labwork:  BMET - do in 2 weeks.  Will do at her primary md office.  Office will contact with results via phone or letter.    Testing/Procedures: none  Follow-Up: Your physician wants you to follow up in: 6 months.  You will receive a reminder letter in the mail one-two months in advance.  If you don't receive a letter, please call our office to schedule the follow up appointment   Any Other Special Instructions Will Be Listed Below (If Applicable).  If you need a refill on your cardiac medications before your next appointment, please call your pharmacy.

## 2017-05-28 NOTE — Patient Instructions (Signed)
Continue coumadin 1 tablet daily except 1 1/2 tablets on Thursdays Recheck in 4 weeks

## 2017-06-04 DIAGNOSIS — R339 Retention of urine, unspecified: Secondary | ICD-10-CM | POA: Diagnosis not present

## 2017-06-11 DIAGNOSIS — I481 Persistent atrial fibrillation: Secondary | ICD-10-CM | POA: Diagnosis not present

## 2017-06-11 DIAGNOSIS — I10 Essential (primary) hypertension: Secondary | ICD-10-CM | POA: Diagnosis not present

## 2017-06-12 ENCOUNTER — Telehealth: Payer: Self-pay

## 2017-06-12 NOTE — Telephone Encounter (Signed)
-----  Message from Satira Sark, MD sent at 06/12/2017  8:09 AM EDT ----- Results reviewed.  Renal function and potassium are stable following recent increase in ACE inhibitor.  No change for now. A copy of this test should be forwarded to Glenda Chroman, MD.

## 2017-06-12 NOTE — Telephone Encounter (Signed)
Patient notified. Routed to PCP

## 2017-06-16 ENCOUNTER — Other Ambulatory Visit: Payer: Self-pay | Admitting: Cardiology

## 2017-06-22 DIAGNOSIS — H01021 Squamous blepharitis right upper eyelid: Secondary | ICD-10-CM | POA: Diagnosis not present

## 2017-06-22 DIAGNOSIS — E119 Type 2 diabetes mellitus without complications: Secondary | ICD-10-CM | POA: Diagnosis not present

## 2017-06-22 DIAGNOSIS — H01025 Squamous blepharitis left lower eyelid: Secondary | ICD-10-CM | POA: Diagnosis not present

## 2017-06-22 DIAGNOSIS — H01024 Squamous blepharitis left upper eyelid: Secondary | ICD-10-CM | POA: Diagnosis not present

## 2017-06-22 DIAGNOSIS — H01022 Squamous blepharitis right lower eyelid: Secondary | ICD-10-CM | POA: Diagnosis not present

## 2017-06-22 DIAGNOSIS — H2513 Age-related nuclear cataract, bilateral: Secondary | ICD-10-CM | POA: Diagnosis not present

## 2017-06-25 ENCOUNTER — Ambulatory Visit (INDEPENDENT_AMBULATORY_CARE_PROVIDER_SITE_OTHER): Payer: PPO | Admitting: *Deleted

## 2017-06-25 DIAGNOSIS — Z6832 Body mass index (BMI) 32.0-32.9, adult: Secondary | ICD-10-CM | POA: Diagnosis not present

## 2017-06-25 DIAGNOSIS — E1165 Type 2 diabetes mellitus with hyperglycemia: Secondary | ICD-10-CM | POA: Diagnosis not present

## 2017-06-25 DIAGNOSIS — I10 Essential (primary) hypertension: Secondary | ICD-10-CM | POA: Diagnosis not present

## 2017-06-25 DIAGNOSIS — Z87891 Personal history of nicotine dependence: Secondary | ICD-10-CM | POA: Diagnosis not present

## 2017-06-25 DIAGNOSIS — Z5181 Encounter for therapeutic drug level monitoring: Secondary | ICD-10-CM

## 2017-06-25 DIAGNOSIS — I4891 Unspecified atrial fibrillation: Secondary | ICD-10-CM

## 2017-06-25 DIAGNOSIS — Z299 Encounter for prophylactic measures, unspecified: Secondary | ICD-10-CM | POA: Diagnosis not present

## 2017-06-25 DIAGNOSIS — J069 Acute upper respiratory infection, unspecified: Secondary | ICD-10-CM | POA: Diagnosis not present

## 2017-06-25 DIAGNOSIS — J449 Chronic obstructive pulmonary disease, unspecified: Secondary | ICD-10-CM | POA: Diagnosis not present

## 2017-06-25 LAB — POCT INR: INR: 2.1

## 2017-06-25 NOTE — Patient Instructions (Signed)
Continue coumadin 1 tablet daily except 1 1/2 tablets on Thursdays Recheck in 6 weeks

## 2017-07-03 DIAGNOSIS — R339 Retention of urine, unspecified: Secondary | ICD-10-CM | POA: Diagnosis not present

## 2017-07-07 DIAGNOSIS — J449 Chronic obstructive pulmonary disease, unspecified: Secondary | ICD-10-CM | POA: Diagnosis not present

## 2017-07-07 DIAGNOSIS — Z299 Encounter for prophylactic measures, unspecified: Secondary | ICD-10-CM | POA: Diagnosis not present

## 2017-07-07 DIAGNOSIS — Z6831 Body mass index (BMI) 31.0-31.9, adult: Secondary | ICD-10-CM | POA: Diagnosis not present

## 2017-07-07 DIAGNOSIS — N184 Chronic kidney disease, stage 4 (severe): Secondary | ICD-10-CM | POA: Diagnosis not present

## 2017-07-07 DIAGNOSIS — I10 Essential (primary) hypertension: Secondary | ICD-10-CM | POA: Diagnosis not present

## 2017-07-07 DIAGNOSIS — F419 Anxiety disorder, unspecified: Secondary | ICD-10-CM | POA: Diagnosis not present

## 2017-07-07 DIAGNOSIS — E1122 Type 2 diabetes mellitus with diabetic chronic kidney disease: Secondary | ICD-10-CM | POA: Diagnosis not present

## 2017-07-07 DIAGNOSIS — E1165 Type 2 diabetes mellitus with hyperglycemia: Secondary | ICD-10-CM | POA: Diagnosis not present

## 2017-08-06 ENCOUNTER — Ambulatory Visit (INDEPENDENT_AMBULATORY_CARE_PROVIDER_SITE_OTHER): Payer: PPO | Admitting: *Deleted

## 2017-08-06 DIAGNOSIS — Z5181 Encounter for therapeutic drug level monitoring: Secondary | ICD-10-CM

## 2017-08-06 DIAGNOSIS — I4891 Unspecified atrial fibrillation: Secondary | ICD-10-CM

## 2017-08-06 LAB — POCT INR: INR: 2 (ref 2.0–3.0)

## 2017-08-06 NOTE — Patient Instructions (Signed)
Continue coumadin 1 tablet daily except 1 1/2 tablets on Thursdays Recheck in 6 weeks 

## 2017-08-10 DIAGNOSIS — J440 Chronic obstructive pulmonary disease with acute lower respiratory infection: Secondary | ICD-10-CM | POA: Diagnosis not present

## 2017-08-10 DIAGNOSIS — R5383 Other fatigue: Secondary | ICD-10-CM | POA: Diagnosis not present

## 2017-08-10 DIAGNOSIS — R05 Cough: Secondary | ICD-10-CM | POA: Diagnosis not present

## 2017-08-10 DIAGNOSIS — Z299 Encounter for prophylactic measures, unspecified: Secondary | ICD-10-CM | POA: Diagnosis not present

## 2017-08-10 DIAGNOSIS — Z79899 Other long term (current) drug therapy: Secondary | ICD-10-CM | POA: Diagnosis not present

## 2017-08-10 DIAGNOSIS — Z6831 Body mass index (BMI) 31.0-31.9, adult: Secondary | ICD-10-CM | POA: Diagnosis not present

## 2017-08-10 DIAGNOSIS — R918 Other nonspecific abnormal finding of lung field: Secondary | ICD-10-CM | POA: Diagnosis not present

## 2017-08-10 DIAGNOSIS — N184 Chronic kidney disease, stage 4 (severe): Secondary | ICD-10-CM | POA: Diagnosis not present

## 2017-08-10 DIAGNOSIS — Z87891 Personal history of nicotine dependence: Secondary | ICD-10-CM | POA: Diagnosis not present

## 2017-08-10 DIAGNOSIS — J209 Acute bronchitis, unspecified: Secondary | ICD-10-CM | POA: Diagnosis not present

## 2017-08-10 DIAGNOSIS — I270 Primary pulmonary hypertension: Secondary | ICD-10-CM | POA: Diagnosis not present

## 2017-08-12 DIAGNOSIS — Z0000 Encounter for general adult medical examination without abnormal findings: Secondary | ICD-10-CM | POA: Diagnosis not present

## 2017-08-12 DIAGNOSIS — Z1211 Encounter for screening for malignant neoplasm of colon: Secondary | ICD-10-CM | POA: Diagnosis not present

## 2017-08-12 DIAGNOSIS — Z299 Encounter for prophylactic measures, unspecified: Secondary | ICD-10-CM | POA: Diagnosis not present

## 2017-08-12 DIAGNOSIS — R5383 Other fatigue: Secondary | ICD-10-CM | POA: Diagnosis not present

## 2017-08-12 DIAGNOSIS — Z7189 Other specified counseling: Secondary | ICD-10-CM | POA: Diagnosis not present

## 2017-08-12 DIAGNOSIS — Z6831 Body mass index (BMI) 31.0-31.9, adult: Secondary | ICD-10-CM | POA: Diagnosis not present

## 2017-08-12 DIAGNOSIS — E7800 Pure hypercholesterolemia, unspecified: Secondary | ICD-10-CM | POA: Diagnosis not present

## 2017-08-12 DIAGNOSIS — I10 Essential (primary) hypertension: Secondary | ICD-10-CM | POA: Diagnosis not present

## 2017-08-12 DIAGNOSIS — Z79899 Other long term (current) drug therapy: Secondary | ICD-10-CM | POA: Diagnosis not present

## 2017-08-12 DIAGNOSIS — Z1331 Encounter for screening for depression: Secondary | ICD-10-CM | POA: Diagnosis not present

## 2017-08-12 DIAGNOSIS — Z1339 Encounter for screening examination for other mental health and behavioral disorders: Secondary | ICD-10-CM | POA: Diagnosis not present

## 2017-08-12 DIAGNOSIS — J449 Chronic obstructive pulmonary disease, unspecified: Secondary | ICD-10-CM | POA: Diagnosis not present

## 2017-08-19 DIAGNOSIS — R339 Retention of urine, unspecified: Secondary | ICD-10-CM | POA: Diagnosis not present

## 2017-08-21 DIAGNOSIS — I270 Primary pulmonary hypertension: Secondary | ICD-10-CM | POA: Diagnosis not present

## 2017-08-21 DIAGNOSIS — R509 Fever, unspecified: Secondary | ICD-10-CM | POA: Diagnosis not present

## 2017-08-21 DIAGNOSIS — Z299 Encounter for prophylactic measures, unspecified: Secondary | ICD-10-CM | POA: Diagnosis not present

## 2017-08-21 DIAGNOSIS — Z6831 Body mass index (BMI) 31.0-31.9, adult: Secondary | ICD-10-CM | POA: Diagnosis not present

## 2017-08-21 DIAGNOSIS — R5383 Other fatigue: Secondary | ICD-10-CM | POA: Diagnosis not present

## 2017-08-21 DIAGNOSIS — E1165 Type 2 diabetes mellitus with hyperglycemia: Secondary | ICD-10-CM | POA: Diagnosis not present

## 2017-08-21 DIAGNOSIS — E0590 Thyrotoxicosis, unspecified without thyrotoxic crisis or storm: Secondary | ICD-10-CM | POA: Diagnosis not present

## 2017-08-21 DIAGNOSIS — J449 Chronic obstructive pulmonary disease, unspecified: Secondary | ICD-10-CM | POA: Diagnosis not present

## 2017-08-21 DIAGNOSIS — J189 Pneumonia, unspecified organism: Secondary | ICD-10-CM | POA: Diagnosis not present

## 2017-08-21 DIAGNOSIS — R05 Cough: Secondary | ICD-10-CM | POA: Diagnosis not present

## 2017-09-04 DIAGNOSIS — Z6831 Body mass index (BMI) 31.0-31.9, adult: Secondary | ICD-10-CM | POA: Diagnosis not present

## 2017-09-04 DIAGNOSIS — Z299 Encounter for prophylactic measures, unspecified: Secondary | ICD-10-CM | POA: Diagnosis not present

## 2017-09-04 DIAGNOSIS — J449 Chronic obstructive pulmonary disease, unspecified: Secondary | ICD-10-CM | POA: Diagnosis not present

## 2017-09-04 DIAGNOSIS — I10 Essential (primary) hypertension: Secondary | ICD-10-CM | POA: Diagnosis not present

## 2017-09-04 DIAGNOSIS — N184 Chronic kidney disease, stage 4 (severe): Secondary | ICD-10-CM | POA: Diagnosis not present

## 2017-09-04 DIAGNOSIS — E1122 Type 2 diabetes mellitus with diabetic chronic kidney disease: Secondary | ICD-10-CM | POA: Diagnosis not present

## 2017-09-10 ENCOUNTER — Ambulatory Visit (INDEPENDENT_AMBULATORY_CARE_PROVIDER_SITE_OTHER): Payer: PPO | Admitting: *Deleted

## 2017-09-10 DIAGNOSIS — Z5181 Encounter for therapeutic drug level monitoring: Secondary | ICD-10-CM | POA: Diagnosis not present

## 2017-09-10 DIAGNOSIS — I4891 Unspecified atrial fibrillation: Secondary | ICD-10-CM | POA: Diagnosis not present

## 2017-09-10 LAB — POCT INR: INR: 2.7 (ref 2.0–3.0)

## 2017-09-10 NOTE — Patient Instructions (Signed)
Continue coumadin 1 tablet daily except 1 1/2 tablets on Thursdays Recheck in 6 weeks 

## 2017-09-23 DIAGNOSIS — R339 Retention of urine, unspecified: Secondary | ICD-10-CM | POA: Diagnosis not present

## 2017-10-13 DIAGNOSIS — Z299 Encounter for prophylactic measures, unspecified: Secondary | ICD-10-CM | POA: Diagnosis not present

## 2017-10-13 DIAGNOSIS — J449 Chronic obstructive pulmonary disease, unspecified: Secondary | ICD-10-CM | POA: Diagnosis not present

## 2017-10-13 DIAGNOSIS — Z6831 Body mass index (BMI) 31.0-31.9, adult: Secondary | ICD-10-CM | POA: Diagnosis not present

## 2017-10-13 DIAGNOSIS — E1165 Type 2 diabetes mellitus with hyperglycemia: Secondary | ICD-10-CM | POA: Diagnosis not present

## 2017-10-13 DIAGNOSIS — N184 Chronic kidney disease, stage 4 (severe): Secondary | ICD-10-CM | POA: Diagnosis not present

## 2017-10-13 DIAGNOSIS — I270 Primary pulmonary hypertension: Secondary | ICD-10-CM | POA: Diagnosis not present

## 2017-10-13 DIAGNOSIS — E1122 Type 2 diabetes mellitus with diabetic chronic kidney disease: Secondary | ICD-10-CM | POA: Diagnosis not present

## 2017-10-20 ENCOUNTER — Ambulatory Visit (INDEPENDENT_AMBULATORY_CARE_PROVIDER_SITE_OTHER): Payer: PPO | Admitting: *Deleted

## 2017-10-20 DIAGNOSIS — I4891 Unspecified atrial fibrillation: Secondary | ICD-10-CM

## 2017-10-20 DIAGNOSIS — Z5181 Encounter for therapeutic drug level monitoring: Secondary | ICD-10-CM

## 2017-10-20 DIAGNOSIS — R339 Retention of urine, unspecified: Secondary | ICD-10-CM | POA: Diagnosis not present

## 2017-10-20 LAB — POCT INR: INR: 2.1 (ref 2.0–3.0)

## 2017-10-20 NOTE — Patient Instructions (Signed)
Continue coumadin 1 tablet daily except 1 1/2 tablets on Thursdays Recheck in 6 weeks

## 2017-11-03 ENCOUNTER — Ambulatory Visit: Payer: PPO | Admitting: Urology

## 2017-11-03 DIAGNOSIS — R3914 Feeling of incomplete bladder emptying: Secondary | ICD-10-CM | POA: Diagnosis not present

## 2017-11-12 DIAGNOSIS — N184 Chronic kidney disease, stage 4 (severe): Secondary | ICD-10-CM | POA: Diagnosis not present

## 2017-11-12 DIAGNOSIS — Z299 Encounter for prophylactic measures, unspecified: Secondary | ICD-10-CM | POA: Diagnosis not present

## 2017-11-12 DIAGNOSIS — Z6831 Body mass index (BMI) 31.0-31.9, adult: Secondary | ICD-10-CM | POA: Diagnosis not present

## 2017-11-12 DIAGNOSIS — N390 Urinary tract infection, site not specified: Secondary | ICD-10-CM | POA: Diagnosis not present

## 2017-11-12 DIAGNOSIS — J449 Chronic obstructive pulmonary disease, unspecified: Secondary | ICD-10-CM | POA: Diagnosis not present

## 2017-11-12 DIAGNOSIS — R339 Retention of urine, unspecified: Secondary | ICD-10-CM | POA: Diagnosis not present

## 2017-11-12 DIAGNOSIS — I10 Essential (primary) hypertension: Secondary | ICD-10-CM | POA: Diagnosis not present

## 2017-11-16 DIAGNOSIS — I10 Essential (primary) hypertension: Secondary | ICD-10-CM | POA: Diagnosis not present

## 2017-11-26 DIAGNOSIS — E1122 Type 2 diabetes mellitus with diabetic chronic kidney disease: Secondary | ICD-10-CM | POA: Diagnosis not present

## 2017-11-26 DIAGNOSIS — Z6832 Body mass index (BMI) 32.0-32.9, adult: Secondary | ICD-10-CM | POA: Diagnosis not present

## 2017-11-26 DIAGNOSIS — J449 Chronic obstructive pulmonary disease, unspecified: Secondary | ICD-10-CM | POA: Diagnosis not present

## 2017-11-26 DIAGNOSIS — I10 Essential (primary) hypertension: Secondary | ICD-10-CM | POA: Diagnosis not present

## 2017-11-26 DIAGNOSIS — Z299 Encounter for prophylactic measures, unspecified: Secondary | ICD-10-CM | POA: Diagnosis not present

## 2017-11-27 DIAGNOSIS — R0902 Hypoxemia: Secondary | ICD-10-CM | POA: Diagnosis not present

## 2017-11-27 DIAGNOSIS — R040 Epistaxis: Secondary | ICD-10-CM | POA: Diagnosis not present

## 2017-11-27 DIAGNOSIS — J449 Chronic obstructive pulmonary disease, unspecified: Secondary | ICD-10-CM | POA: Diagnosis not present

## 2017-11-28 DIAGNOSIS — E1122 Type 2 diabetes mellitus with diabetic chronic kidney disease: Secondary | ICD-10-CM | POA: Diagnosis not present

## 2017-11-28 DIAGNOSIS — R040 Epistaxis: Secondary | ICD-10-CM | POA: Diagnosis not present

## 2017-11-28 DIAGNOSIS — N184 Chronic kidney disease, stage 4 (severe): Secondary | ICD-10-CM | POA: Diagnosis not present

## 2017-11-28 DIAGNOSIS — I129 Hypertensive chronic kidney disease with stage 1 through stage 4 chronic kidney disease, or unspecified chronic kidney disease: Secondary | ICD-10-CM | POA: Diagnosis not present

## 2017-11-29 DIAGNOSIS — I129 Hypertensive chronic kidney disease with stage 1 through stage 4 chronic kidney disease, or unspecified chronic kidney disease: Secondary | ICD-10-CM | POA: Diagnosis not present

## 2017-11-29 DIAGNOSIS — N184 Chronic kidney disease, stage 4 (severe): Secondary | ICD-10-CM | POA: Diagnosis not present

## 2017-11-29 DIAGNOSIS — R040 Epistaxis: Secondary | ICD-10-CM | POA: Diagnosis not present

## 2017-11-29 DIAGNOSIS — E1122 Type 2 diabetes mellitus with diabetic chronic kidney disease: Secondary | ICD-10-CM | POA: Diagnosis not present

## 2017-11-29 NOTE — Progress Notes (Deleted)
Cardiology Office Note  Date: 11/29/2017   ID: Wanda [name], DOB [birth year], MRN [id number]  PCP: Glenda Chroman, MD  Primary Cardiologist: Rozann Lesches, MD   No chief complaint on file.   History of Present Illness: Wanda [name] is a 77 y.o. female last seen in April.   She continues to follow in the anticoagulation clinic on Coumadin.  Past Medical History:  Diagnosis Date  . Celiac disease   . COPD (chronic obstructive pulmonary disease) ([place])   . Essential hypertension, benign   . Goiter   . History of breast cancer    Status post mastectomy  . History of cardiac catheterization    No significant CAD 2006  . Mixed hyperlipidemia   . Paroxysmal atrial fibrillation (HCC)    Coumadin therapy  . PUD (peptic ulcer disease)   . Pulmonary hypertension ([place])   . Type 2 diabetes mellitus ([place])     Past Surgical History:  Procedure Laterality Date  . DILATION AND CURETTAGE OF UTERUS    . HYSTEROSCOPY W/ ENDOMETRIAL ABLATION     ThermaChoice  . MASTECTOMY      Current Outpatient Medications  Medication Sig Dispense Refill  . ALPRAZolam (XANAX) 1 MG tablet Take 1 mg by mouth 2 (two) times daily as needed for anxiety or sleep.     Marland Kitchen atorvastatin (LIPITOR) 10 MG tablet Take 10 mg by mouth every evening.     . baclofen (LIORESAL) 10 MG tablet Take 10 mg by mouth 3 (three) times daily. Rarely takes.    Marland Kitchen diltiazem (CARDIZEM CD) 240 MG 24 hr capsule TAKE ONE CAPSULE BY MOUTH DAILY 30 capsule 6  . furosemide (LASIX) 20 MG tablet Take 1 tablet (20 mg total) by mouth as needed (swelling). (Patient taking differently: Take 20 mg by mouth as needed (swelling). Taking every other day) 30 tablet 3  . glimepiride (AMARYL) 2 MG tablet Take 2 mg by mouth daily with breakfast.    . lisinopril (PRINIVIL,ZESTRIL) 20 MG tablet Take 1 tablet (20 mg total) by mouth 2 (two) times daily. 180 tablet 3  . sennosides-docusate sodium (SENOKOT-S) 8.6-50 MG tablet Take 1 tablet by mouth  daily as needed for constipation.    . tamsulosin (FLOMAX) 0.4 MG CAPS capsule Take 0.4 mg by mouth daily. TAKE 30 MINUTES AFTER THE SAME MEAL EACH DAY  2  . warfarin (COUMADIN) 5 MG tablet take 1 and 1/2 tablets once daily or as directed PER COAGULATION CLINIC (Patient taking differently: Take 5 mg by mouth daily. ) 135 tablet 0   No current facility-administered medications for this visit.    Allergies:  Gluten; Hydromorphone hcl; and Prednisone   Social History: The patient  reports that she quit smoking about 22 years ago. Her smoking use included cigarettes. She has never used smokeless tobacco. She reports that she drinks alcohol. She reports that she does not use drugs.   Family History: The patient's family history includes Aortic aneurysm in her maternal grandfather; CVA in her maternal grandmother; Celiac disease in her brother; Heart attack in her mother.   ROS:  Please see the history of present illness. Otherwise, complete review of systems is positive for {NONE DEFAULTED:18576::"none"}.  All other systems are reviewed and negative.   Physical Exam: VS:  There were no vitals taken for this visit., BMI There is no height or weight on file to calculate BMI.  Wt Readings from Last 3 Encounters:  05/28/17 217 lb (98.4 kg)  11/20/16 226 lb (102.5 kg)  08/14/16 223 lb (101.2 kg)    General: Patient appears comfortable at rest. HEENT: Conjunctiva and lids normal, oropharynx clear with moist mucosa. Neck: Supple, no elevated JVP or carotid bruits, no thyromegaly. Lungs: Clear to auscultation, nonlabored breathing at rest. Cardiac: Regular rate and rhythm, no S3 or significant systolic murmur, no pericardial rub. Abdomen: Soft, nontender, no hepatomegaly, bowel sounds present, no guarding or rebound. Extremities: No pitting edema, distal pulses 2+. Skin: Warm and dry. Musculoskeletal: No kyphosis. Neuropsychiatric: Alert and oriented x3, affect grossly appropriate.  ECG: I  personally reviewed the tracing from 08/05/2016 which showed rate controlled atrial fibrillation with poor R wave progression and low voltage.  Recent Labwork:  May 2019: BUN 18, creatinine 1.49, potassium 4.4   Other Studies Reviewed Today:  Echocardiogram 08/21/2016: Study Conclusions  - Procedure narrative: Transthoracic echocardiography. Image quality was suboptimal. The study was technically difficult, as a result of poor acoustic windows and breast implants. - Left ventricle: The cavity size was normal. Wall thickness was increased in a pattern of mild LVH. Systolic function was normal. The estimated ejection fraction was in the range of 55% to 60%. Images were inadequate for LV wall motion assessment. However, there was no gross regional variation on available images. The study was not technically sufficient to allow evaluation of LV diastolic dysfunction due to atrial fibrillation. - Mitral valve: There was moderate regurgitation, with multiple jets. - Left atrium: The atrium was mildly to moderately dilated. - Right atrium: The atrium was mildly dilated. - Tricuspid valve: There was moderate regurgitation. - Pulmonary arteries: PA peak pressure: 50 mm Hg (S).  Assessment and Plan:    Current medicines were reviewed with the patient today.  No orders of the defined types were placed in this encounter.   Disposition:  Signed, Satira Sark, MD, [facility] 11/29/2017 3:15 PM    [facility] at [place], [place], [place] [address] Phone: [phone]; Fax: [phone]

## 2017-11-30 ENCOUNTER — Ambulatory Visit: Payer: PPO | Admitting: Cardiology

## 2017-11-30 DIAGNOSIS — R040 Epistaxis: Secondary | ICD-10-CM | POA: Diagnosis not present

## 2017-11-30 DIAGNOSIS — I129 Hypertensive chronic kidney disease with stage 1 through stage 4 chronic kidney disease, or unspecified chronic kidney disease: Secondary | ICD-10-CM | POA: Diagnosis not present

## 2017-11-30 DIAGNOSIS — Z882 Allergy status to sulfonamides status: Secondary | ICD-10-CM | POA: Diagnosis not present

## 2017-11-30 DIAGNOSIS — Z87891 Personal history of nicotine dependence: Secondary | ICD-10-CM | POA: Diagnosis not present

## 2017-11-30 DIAGNOSIS — E1122 Type 2 diabetes mellitus with diabetic chronic kidney disease: Secondary | ICD-10-CM | POA: Diagnosis not present

## 2017-11-30 DIAGNOSIS — I445 Left posterior fascicular block: Secondary | ICD-10-CM | POA: Diagnosis not present

## 2017-11-30 DIAGNOSIS — Z7901 Long term (current) use of anticoagulants: Secondary | ICD-10-CM | POA: Diagnosis not present

## 2017-11-30 DIAGNOSIS — J449 Chronic obstructive pulmonary disease, unspecified: Secondary | ICD-10-CM | POA: Diagnosis not present

## 2017-11-30 DIAGNOSIS — Z79899 Other long term (current) drug therapy: Secondary | ICD-10-CM | POA: Diagnosis not present

## 2017-11-30 DIAGNOSIS — N184 Chronic kidney disease, stage 4 (severe): Secondary | ICD-10-CM | POA: Diagnosis not present

## 2017-11-30 DIAGNOSIS — Z885 Allergy status to narcotic agent status: Secondary | ICD-10-CM | POA: Diagnosis not present

## 2017-12-02 ENCOUNTER — Encounter: Payer: Self-pay | Admitting: Cardiology

## 2017-12-03 ENCOUNTER — Encounter: Payer: Self-pay | Admitting: Cardiology

## 2017-12-03 ENCOUNTER — Ambulatory Visit: Payer: PPO | Admitting: Cardiology

## 2017-12-03 VITALS — BP 144/70 | HR 84 | Ht 68.0 in | Wt 225.0 lb

## 2017-12-03 DIAGNOSIS — I4819 Other persistent atrial fibrillation: Secondary | ICD-10-CM | POA: Diagnosis not present

## 2017-12-03 DIAGNOSIS — Z79899 Other long term (current) drug therapy: Secondary | ICD-10-CM | POA: Diagnosis not present

## 2017-12-03 DIAGNOSIS — I10 Essential (primary) hypertension: Secondary | ICD-10-CM

## 2017-12-03 DIAGNOSIS — I340 Nonrheumatic mitral (valve) insufficiency: Secondary | ICD-10-CM

## 2017-12-03 DIAGNOSIS — R339 Retention of urine, unspecified: Secondary | ICD-10-CM | POA: Diagnosis not present

## 2017-12-03 MED ORDER — APIXABAN 5 MG PO TABS: 5.0000 mg | ORAL_TABLET | Freq: Two times a day (BID) | ORAL | 11 refills | Status: DC

## 2017-12-03 NOTE — Progress Notes (Signed)
Cardiology Office Note  Date: 12/03/2017   ID: Wanda [name], DOB Dec 15, 1940, MRN [id number]  PCP: Glenda Chroman, MD  Primary Cardiologist: Rozann Lesches, MD   Chief Complaint  Patient presents with  . Atrial Fibrillation    History of Present Illness: Wanda [name] is a 77 y.o. female last seen in April.  She presents to the office to discuss recent events.  Records indicate evaluation at [facility] for epistaxis requiring Rhino Rocket for control.  Hemoglobin was 13.0 with platelets 298, INR was 2.3.  She has been on Coumadin with history of paroxysmal atrial fibrillation.  She was very frightened by this occurrence and decided to stop Coumadin.  She presents today to discuss alternatives.  She is here with her son as well. CHADSVASC score is 5.  We went over stroke risk on anticoagulation versus aspirin, also available DOACS.  I did review her recent lab work.  We discussed switching to Eliquis 5 mg twice daily to see if she tolerates this better.  Past Medical History:  Diagnosis Date  . Celiac disease   . COPD (chronic obstructive pulmonary disease) ([place])   . Essential hypertension, benign   . Goiter   . History of breast cancer    Status post mastectomy  . History of cardiac catheterization    No significant CAD 2006  . Mixed hyperlipidemia   . Paroxysmal atrial fibrillation (HCC)    Coumadin therapy  . PUD (peptic ulcer disease)   . Pulmonary hypertension ([place])   . Type 2 diabetes mellitus ([place])     Past Surgical History:  Procedure Laterality Date  . DILATION AND CURETTAGE OF UTERUS    . HYSTEROSCOPY W/ ENDOMETRIAL ABLATION     ThermaChoice  . MASTECTOMY      Current Outpatient Medications  Medication Sig Dispense Refill  . ALPRAZolam (XANAX) 1 MG tablet Take 1 mg by mouth 2 (two) times daily as needed for anxiety or sleep.     Marland Kitchen atorvastatin (LIPITOR) 10 MG tablet Take 10 mg by mouth every evening.     . baclofen (LIORESAL) 10  MG tablet Take 10 mg by mouth 3 (three) times daily. Rarely takes.    Marland Kitchen diltiazem (CARDIZEM CD) 240 MG 24 hr capsule TAKE ONE CAPSULE BY MOUTH DAILY 30 capsule 6  . hydrALAZINE (APRESOLINE) 25 MG tablet Take 25 mg by mouth 3 (three) times daily.  3  . lisinopril (PRINIVIL,ZESTRIL) 10 MG tablet Take 10 mg by mouth daily.    . metoprolol succinate (TOPROL-XL) 50 MG 24 hr tablet TAKE ONE TABLET BY MOUTH EVERY MORNING AND NIGHTLY.  4  . trimethoprim (TRIMPEX) 100 MG tablet Take 100 mg by mouth at bedtime.    Marland Kitchen apixaban (ELIQUIS) 5 MG TABS tablet Take 1 tablet (5 mg total) by mouth 2 (two) times daily. 60 tablet 11   No current facility-administered medications for this visit.    Allergies:  Gluten; Hydromorphone hcl; and Prednisone   Social History: The patient  reports that she quit smoking about 22 years ago. Her smoking use included cigarettes. She has never used smokeless tobacco. She reports that she drinks alcohol. She reports that she does not use drugs.   ROS:  Please see the history of present illness. Otherwise, complete review of systems is positive for chronic shortness of breath.  All other systems are reviewed and negative.   Physical Exam: VS:  BP (!) 144/70   Pulse 84  Ht 5' 8" (1.727 m)   Wt 225 lb (102.1 kg)   SpO2 95%   BMI 34.21 kg/m , BMI Body mass index is 34.21 kg/m.  Wt Readings from Last 3 Encounters:  12/03/17 225 lb (102.1 kg)  05/28/17 217 lb (98.4 kg)  11/20/16 226 lb (102.5 kg)    General: Elderly woman, patient appears comfortable at rest, wearing oxygen via nasal cannula. HEENT: Conjunctiva and lids normal, oropharynx clear. Neck: Supple, no elevated JVP or carotid bruits, no thyromegaly. Lungs: Clear to auscultation, nonlabored breathing at rest. Cardiac: Regular rate and rhythm, no S3, soft systolic murmur. Abdomen: Soft, nontender, bowel sounds present. Extremities: Trace lower leg edema, distal pulses 2+. Skin: Warm and dry. Musculoskeletal: No  kyphosis. Neuropsychiatric: Alert and oriented x3, affect grossly appropriate.  ECG: I personally reviewed the tracing from 08/05/2016 which showed rate controlled atrial fibrillation with poor R wave progression and low voltage.  Recent Labwork:  October 2019: BUN 19, creatinine 1.22, potassium 4.0, AST 15, ALT 15, INR 2.3, hemoglobin 12.5, platelets 264  Other Studies Reviewed Today:  Echocardiogram 08/21/2016: Study Conclusions  - Procedure narrative: Transthoracic echocardiography. Image quality was suboptimal. The study was technically difficult, as a result of poor acoustic windows and breast implants. - Left ventricle: The cavity size was normal. Wall thickness was increased in a pattern of mild LVH. Systolic function was normal. The estimated ejection fraction was in the range of 55% to 60%. Images were inadequate for LV wall motion assessment. However, there was no gross regional variation on available images. The study was not technically sufficient to allow evaluation of LV diastolic dysfunction due to atrial fibrillation. - Mitral valve: There was moderate regurgitation, with multiple jets. - Left atrium: The atrium was mildly to moderately dilated. - Right atrium: The atrium was mildly dilated. - Tricuspid valve: There was moderate regurgitation. - Pulmonary arteries: PA peak pressure: 50 mm Hg (S).  Assessment and Plan:  1.  Persistent atrial fibrillation.  She is asymptomatic in terms of palpitations on Cardizem CD and Toprol-XL.  Had recent significant epistaxis while on Coumadin and with therapeutic INR.  We had a long discussion today and she would like to switch to Eliquis 5 mg twice daily.  Follow-up plan with CBC and BMET.  2.  Essential hypertension, continues on lisinopril and hydralazine in addition to rate control medications.  3.  Moderate mitral regurgitation with secondary pulmonary hypertension.  Symptomatically stable.  Current  medicines were reviewed with the patient today.   Orders Placed This Encounter  Procedures  . CBC  . Basic Metabolic Panel (BMET)  . EKG 12-Lead    Disposition: Follow-up in 3 months.  Signed, Satira Sark, MD, [facility] 12/03/2017 4:27 PM    [facility] at [facility] [address] [place], [place] [address] Phone: [phone]; Fax: [phone]

## 2017-12-03 NOTE — Patient Instructions (Addendum)
Medication Instructions:  Take Eliquis 5 mg twice a day  14 day samples Eliquis 5 mg given, lot [id number], exp 12/2019, PLUS, 30 DAY FREE TRIAL CARD given  If you need a refill on your cardiac medications before your next appointment, please call your pharmacy.   Lab work: CBC and BMET JUST BEFORE follow up visit in 3 months If you have labs (blood work) drawn today and your tests are completely normal, you will receive your results only by: Marland Kitchen MyChart Message (if you have MyChart) OR . A paper copy in the mail If you have any lab test that is abnormal or we need to change your treatment, we will call you to review the results.  Testing/Procedures: NONE  Follow-Up: At [facility], you and your health needs are our priority.  As part of our continuing mission to provide you with exceptional heart care, we have created designated Provider Care Teams.  These Care Teams include your primary Cardiologist (physician) and Advanced Practice Providers (APPs -  Physician Assistants and Nurse Practitioners) who all work together to provide you with the care you need, when you need it. You will need a follow up appointment in 3 months.  Please call our office 2 months in advance to schedule this appointment.  You may see Rozann Lesches, MD or one of the following Advanced Practice Providers on your designated Care Team:   Bernerd Pho, PA-C [facility]) . Ermalinda Barrios, PA-C ([place])  Any Other Special Instructions Will Be Listed Below (If Applicable). NONE       Thank you for choosing [facility] !

## 2017-12-08 ENCOUNTER — Other Ambulatory Visit: Payer: Self-pay

## 2017-12-08 DIAGNOSIS — E1122 Type 2 diabetes mellitus with diabetic chronic kidney disease: Secondary | ICD-10-CM | POA: Diagnosis not present

## 2017-12-08 DIAGNOSIS — I10 Essential (primary) hypertension: Secondary | ICD-10-CM | POA: Diagnosis not present

## 2017-12-08 DIAGNOSIS — Z299 Encounter for prophylactic measures, unspecified: Secondary | ICD-10-CM | POA: Diagnosis not present

## 2017-12-08 DIAGNOSIS — E1165 Type 2 diabetes mellitus with hyperglycemia: Secondary | ICD-10-CM | POA: Diagnosis not present

## 2017-12-08 DIAGNOSIS — R600 Localized edema: Secondary | ICD-10-CM | POA: Diagnosis not present

## 2017-12-08 DIAGNOSIS — Z6831 Body mass index (BMI) 31.0-31.9, adult: Secondary | ICD-10-CM | POA: Diagnosis not present

## 2017-12-08 DIAGNOSIS — J449 Chronic obstructive pulmonary disease, unspecified: Secondary | ICD-10-CM | POA: Diagnosis not present

## 2017-12-14 ENCOUNTER — Other Ambulatory Visit: Payer: Self-pay

## 2017-12-14 DIAGNOSIS — N184 Chronic kidney disease, stage 4 (severe): Secondary | ICD-10-CM | POA: Diagnosis not present

## 2017-12-14 NOTE — Patient Outreach (Signed)
[facility]) Care Management  12/14/2017  LAYLANI PUDWILL 1940/11/24 [id number]   Telephone Screen  Referral Date: 12/08/17 Referral Source: EMMI-Prevent Referral Reason: " patient concerned about changes in meds which has caused recent hospital visit, now uses oxygen, appt with PCP today" Insurance: HTA   Outreach attempt # 1 to patient. No answer at present. Voicemail box is full and unable to leave message.    Plan: RN CM will make outreach attempt to patient within 3-4 business days. RN CM will send unsuccessful outreach letter to patient.    Enzo Montgomery, RN,BSN,CCM [facility] Management Telephonic Care Management Coordinator Direct Phone: [phone] Toll Free: [phone] Fax: [phone]

## 2017-12-15 ENCOUNTER — Other Ambulatory Visit: Payer: Self-pay

## 2017-12-15 NOTE — Patient Outreach (Signed)
[facility]) Care Management  12/15/2017  Wanda [name] 04/24/1940 [id number]   Telephone Screen  Referral Date: 12/08/17 Referral Source: EMMI-Prevent Referral Reason: " patient concerned about changes in meds which has caused recent hospital visit, now uses oxygen, appt with PCP today" Insurance: HTA   Outreach attempt #2 to patient. Spoke with patient and reviewed referral reason. 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 She is very knowledgeable regarding the healthcare system and her medical care. She voices that she had some questions regarding her meds but has since gotten answers from MD and denies needing any further assistance. RN CM discussed [facility] services with patient. She states she does not feel like she needs services at present as she has no issues with meds or transportation and is able to manage her own care. Patient did not wish to complete screening at this time. She voices she is knowledgeable about her conditions and does not need RN assistance. Patient also states that her son is "second in command for EMS services" in their county so she is well cared for and covered. Patient was agreeable to RN CM mailing out [facility] brochure and contact info for future reference.       Plan: RN CM will close case at this time. RN CM will send [facility] brochure and info via mail to patient.   Enzo Montgomery, RN,BSN,CCM [facility] Management Telephonic Care Management Coordinator Direct Phone: [phone] Toll Free: [phone] Fax: [phone]

## 2017-12-29 DIAGNOSIS — Z6832 Body mass index (BMI) 32.0-32.9, adult: Secondary | ICD-10-CM | POA: Diagnosis not present

## 2017-12-29 DIAGNOSIS — I270 Primary pulmonary hypertension: Secondary | ICD-10-CM | POA: Diagnosis not present

## 2017-12-29 DIAGNOSIS — R339 Retention of urine, unspecified: Secondary | ICD-10-CM | POA: Diagnosis not present

## 2017-12-29 DIAGNOSIS — I10 Essential (primary) hypertension: Secondary | ICD-10-CM | POA: Diagnosis not present

## 2017-12-29 DIAGNOSIS — Z299 Encounter for prophylactic measures, unspecified: Secondary | ICD-10-CM | POA: Diagnosis not present

## 2017-12-29 DIAGNOSIS — J449 Chronic obstructive pulmonary disease, unspecified: Secondary | ICD-10-CM | POA: Diagnosis not present

## 2017-12-29 DIAGNOSIS — R609 Edema, unspecified: Secondary | ICD-10-CM | POA: Diagnosis not present

## 2018-01-01 DIAGNOSIS — I445 Left posterior fascicular block: Secondary | ICD-10-CM | POA: Diagnosis not present

## 2018-01-16 ENCOUNTER — Other Ambulatory Visit: Payer: Self-pay | Admitting: Cardiology

## 2018-01-19 DIAGNOSIS — E1165 Type 2 diabetes mellitus with hyperglycemia: Secondary | ICD-10-CM | POA: Diagnosis not present

## 2018-01-19 DIAGNOSIS — I10 Essential (primary) hypertension: Secondary | ICD-10-CM | POA: Diagnosis not present

## 2018-01-19 DIAGNOSIS — Z299 Encounter for prophylactic measures, unspecified: Secondary | ICD-10-CM | POA: Diagnosis not present

## 2018-01-19 DIAGNOSIS — Z6832 Body mass index (BMI) 32.0-32.9, adult: Secondary | ICD-10-CM | POA: Diagnosis not present

## 2018-01-19 DIAGNOSIS — E1122 Type 2 diabetes mellitus with diabetic chronic kidney disease: Secondary | ICD-10-CM | POA: Diagnosis not present

## 2018-01-19 DIAGNOSIS — I272 Pulmonary hypertension, unspecified: Secondary | ICD-10-CM | POA: Diagnosis not present

## 2018-01-26 DIAGNOSIS — R339 Retention of urine, unspecified: Secondary | ICD-10-CM | POA: Diagnosis not present

## 2018-01-29 DIAGNOSIS — Z299 Encounter for prophylactic measures, unspecified: Secondary | ICD-10-CM | POA: Diagnosis not present

## 2018-01-29 DIAGNOSIS — I10 Essential (primary) hypertension: Secondary | ICD-10-CM | POA: Diagnosis not present

## 2018-01-29 DIAGNOSIS — E1122 Type 2 diabetes mellitus with diabetic chronic kidney disease: Secondary | ICD-10-CM | POA: Diagnosis not present

## 2018-01-29 DIAGNOSIS — N184 Chronic kidney disease, stage 4 (severe): Secondary | ICD-10-CM | POA: Diagnosis not present

## 2018-01-29 DIAGNOSIS — E1165 Type 2 diabetes mellitus with hyperglycemia: Secondary | ICD-10-CM | POA: Diagnosis not present

## 2018-01-29 DIAGNOSIS — Z6832 Body mass index (BMI) 32.0-32.9, adult: Secondary | ICD-10-CM | POA: Diagnosis not present

## 2018-01-31 DIAGNOSIS — I445 Left posterior fascicular block: Secondary | ICD-10-CM | POA: Diagnosis not present

## 2018-02-02 ENCOUNTER — Institutional Professional Consult (permissible substitution): Payer: PPO | Admitting: Internal Medicine

## 2018-02-11 ENCOUNTER — Telehealth: Payer: Self-pay | Admitting: Cardiology

## 2018-02-11 NOTE — Telephone Encounter (Signed)
Patient called wanting to know if she needs to keep the appointment with Dr. Domenic Polite on 03/05/2018. She has upcoming appointment with Dr.Bensimhon February 2020. Patient started on Eliquis October 2019 but was never told to have a 6 month follow up.   Please call [phone].

## 2018-02-11 NOTE — Telephone Encounter (Signed)
Patient informed that she should keep the already scheduled visit with Domenic Polite and she would be advised at that time when she would need to follow up again. Verbalized understanding.

## 2018-02-12 DIAGNOSIS — Z6831 Body mass index (BMI) 31.0-31.9, adult: Secondary | ICD-10-CM | POA: Diagnosis not present

## 2018-02-12 DIAGNOSIS — Z299 Encounter for prophylactic measures, unspecified: Secondary | ICD-10-CM | POA: Diagnosis not present

## 2018-02-12 DIAGNOSIS — E1122 Type 2 diabetes mellitus with diabetic chronic kidney disease: Secondary | ICD-10-CM | POA: Diagnosis not present

## 2018-02-12 DIAGNOSIS — I10 Essential (primary) hypertension: Secondary | ICD-10-CM | POA: Diagnosis not present

## 2018-02-12 DIAGNOSIS — N184 Chronic kidney disease, stage 4 (severe): Secondary | ICD-10-CM | POA: Diagnosis not present

## 2018-02-12 DIAGNOSIS — J449 Chronic obstructive pulmonary disease, unspecified: Secondary | ICD-10-CM | POA: Diagnosis not present

## 2018-02-26 DIAGNOSIS — E1122 Type 2 diabetes mellitus with diabetic chronic kidney disease: Secondary | ICD-10-CM | POA: Diagnosis not present

## 2018-02-26 DIAGNOSIS — N184 Chronic kidney disease, stage 4 (severe): Secondary | ICD-10-CM | POA: Diagnosis not present

## 2018-02-26 DIAGNOSIS — J449 Chronic obstructive pulmonary disease, unspecified: Secondary | ICD-10-CM | POA: Diagnosis not present

## 2018-02-26 DIAGNOSIS — Z6832 Body mass index (BMI) 32.0-32.9, adult: Secondary | ICD-10-CM | POA: Diagnosis not present

## 2018-02-26 DIAGNOSIS — I10 Essential (primary) hypertension: Secondary | ICD-10-CM | POA: Diagnosis not present

## 2018-02-26 DIAGNOSIS — Z299 Encounter for prophylactic measures, unspecified: Secondary | ICD-10-CM | POA: Diagnosis not present

## 2018-03-03 DIAGNOSIS — I445 Left posterior fascicular block: Secondary | ICD-10-CM | POA: Diagnosis not present

## 2018-03-04 NOTE — Progress Notes (Signed)
Cardiology Office Note  Date: 03/05/2018   ID: Wanda [name], DOB 1940-07-21, MRN [id number]  PCP: Glenda Chroman, MD  Primary Cardiologist: Rozann Lesches, MD   Chief Complaint  Patient presents with  . Cardiac follow-up    History of Present Illness: Wanda [name] is a 78 y.o. female last seen in October 2019.  She presents for a follow-up visit. Eliquis was initiated at the last visit as replacement for Coumadin.  Prior to that she had had a substantial episode of epistaxis with recurrence despite nasal packing when she was on Coumadin ([place]).  She has done well with Eliquis so far.  She has been on supplemental oxygen over the last few months, states that this is been an issue since she had the episode of epistaxis.  She does not report any cough or hemoptysis, no wheezing.  Dr. Woody Seller has been managing her and has referred her back to see Dr. Haroldine Laws with history of pulmonary hypertension.  Her last echocardiogram was in 2018 at which point LVEF was normal and she had both moderate mitral and tricuspid vegetation with moderate pulmonary hypertension, PASP 50 mmHg.  She tells me that her blood pressure has not been well controlled in the last few months, with Dr. Woody Seller recently it increased Coreg to 25 mg twice daily. Rate control of her atrial fibrillation has been good.  She is due for follow-up CBC and BMET.   Past Medical History:  Diagnosis Date  . Celiac disease   . COPD (chronic obstructive pulmonary disease) ([place])   . Essential hypertension, benign   . Goiter   . History of breast cancer    Status post mastectomy  . History of cardiac catheterization    No significant CAD 2006  . Mixed hyperlipidemia   . Paroxysmal atrial fibrillation (HCC)    Coumadin therapy  . PUD (peptic ulcer disease)   . Pulmonary hypertension ([place])   . Type 2 diabetes mellitus ([place])     Past Surgical History:  Procedure Laterality Date  . DILATION AND  CURETTAGE OF UTERUS    . HYSTEROSCOPY W/ ENDOMETRIAL ABLATION     ThermaChoice  . MASTECTOMY      Current Outpatient Medications  Medication Sig Dispense Refill  . ALPRAZolam (XANAX) 1 MG tablet Take 1 mg by mouth 2 (two) times daily as needed for anxiety or sleep.     Marland Kitchen apixaban (ELIQUIS) 5 MG TABS tablet Take 1 tablet (5 mg total) by mouth 2 (two) times daily. 60 tablet 11  . baclofen (LIORESAL) 10 MG tablet Take 10 mg by mouth 3 (three) times daily. Rarely takes.    . carvedilol (COREG) 25 MG tablet Take 1 tablet by mouth 2 (two) times daily.    Marland Kitchen diltiazem (CARDIZEM CD) 240 MG 24 hr capsule TAKE ONE CAPSULE BY MOUTH DAILY 90 capsule 1  . glimepiride (AMARYL) 2 MG tablet Take 2 mg by mouth every morning.    . hydrALAZINE (APRESOLINE) 25 MG tablet Take 25 mg by mouth 3 (three) times daily.  3  . lisinopril (PRINIVIL,ZESTRIL) 10 MG tablet Take 10 mg by mouth 2 (two) times daily.     Marland Kitchen trimethoprim (TRIMPEX) 100 MG tablet Take 100 mg by mouth at bedtime.    Marland Kitchen atorvastatin (LIPITOR) 10 MG tablet Take 10 mg by mouth every evening.      No current facility-administered medications for this visit.    Allergies:  Gluten; Hydromorphone hcl; and  Prednisone   Social History: The patient  reports that she quit smoking about 23 years ago. Her smoking use included cigarettes. She has never used smokeless tobacco. She reports current alcohol use. She reports that she does not use drugs.   ROS:  Please see the history of present illness. Otherwise, complete review of systems is positive for NYHA class III dyspnea.  All other systems are reviewed and negative.   Physical Exam: VS:  BP 129/68   Pulse 73   Ht _0  (1.727 m)   Wt 220 lb (99.8 kg)   SpO2 96%   BMI 33.45 kg/m , BMI Body mass index is 33.45 kg/m.  Wt Readings from Last 3 Encounters:  03/05/18 220 lb (99.8 kg)  12/03/17 225 lb (102.1 kg)  05/28/17 217 lb (98.4 kg)    General: Elderly woman, wearing oxygen via nasal cannula, no  distress. HEENT: Conjunctiva and lids normal, oropharynx clear. Neck: Supple, no elevated JVP or carotid bruits. Lungs: No wheezing or rhonchi, nonlabored breathing at rest. Cardiac: Irregularly irregular, no S3, 2/6 apical systolic murmur, no pericardial rub. Abdomen: Soft, nontender, bowel sounds present. Extremities: Mild lower leg edema, distal pulses 2+. Skin: Warm and dry. Musculoskeletal: No kyphosis. Neuropsychiatric: Alert and oriented x3, affect grossly appropriate.  ECG: I personally reviewed the tracing from 12/03/2017 which showed rate controlled atrial fibrillation.  Recent Labwork:  October 2019: BUN 19, creatinine 1.22, potassium 4.0, AST 15, ALT 15, INR 2.3, hemoglobin 12.5, platelets 264  Other Studies Reviewed Today:  Echocardiogram 08/21/2016: Study Conclusions  - Procedure narrative: Transthoracic echocardiography. Image quality was suboptimal. The study was technically difficult, as a result of poor acoustic windows and breast implants. - Left ventricle: The cavity size was normal. Wall thickness was increased in a pattern of mild LVH. Systolic function was normal. The estimated ejection fraction was in the range of 55% to 60%. Images were inadequate for LV wall motion assessment. However, there was no gross regional variation on available images. The study was not technically sufficient to allow evaluation of LV diastolic dysfunction due to atrial fibrillation. - Mitral valve: There was moderate regurgitation, with multiple jets. - Left atrium: The atrium was mildly to moderately dilated. - Right atrium: The atrium was mildly dilated. - Tricuspid valve: There was moderate regurgitation. - Pulmonary arteries: PA peak pressure: 50 mm Hg (S).  Assessment and Plan:  1.  Persistent/permanent atrial fibrillation.  Planning to address with heart rate control strategy as she is not particularly symptomatic in terms of palpitations.  She is on  combination of beta-blocker and calcium channel blocker with good heart rate control.  She is tolerating Eliquis so far, no obvious bleeding episodes.  Follow-up CBC and BMET.  2.  Chronic hypoxic respiratory failure, on supplemental oxygen 4 L nasal cannula per Dr. Woody Seller.  Patient states that this is been mostly a problem since her large episode of epistaxis late last year.  She does not report any wheezing or cough, no hemoptysis.  She has not had a chest x-ray, this will be obtained.  We will also get a follow-up echocardiogram to reassess cardiac structure and function with known history of valvular heart disease and pulmonary hypertension.  She has been scheduled to see Dr. Haroldine Laws per Dr. Woody Seller.  3.  Moderate mitral and tricuspid regurgitation by echocardiogram in 2018, follow-up study being obtained.  4.  Essential hypertension, blood pressure is well controlled today.  She also remains on lisinopril and hydralazine in addition to  calcium channel blocker and beta-blocker.  Current medicines were reviewed with the patient today.   Orders Placed This Encounter  Procedures  . DG Chest 2 View  . Basic metabolic panel  . CBC  . ECHOCARDIOGRAM COMPLETE    Disposition: Follow-up in 3 months.  Signed, Satira Sark, MD, [facility] 03/05/2018 8:55 AM    [facility] at [place], [place], [place] [address] Phone: [phone]; Fax: [phone]

## 2018-03-05 ENCOUNTER — Encounter: Payer: Self-pay | Admitting: Cardiology

## 2018-03-05 ENCOUNTER — Telehealth: Payer: Self-pay | Admitting: Cardiology

## 2018-03-05 ENCOUNTER — Ambulatory Visit: Payer: PPO | Admitting: Cardiology

## 2018-03-05 ENCOUNTER — Ambulatory Visit (INDEPENDENT_AMBULATORY_CARE_PROVIDER_SITE_OTHER): Payer: PPO | Admitting: Cardiology

## 2018-03-05 VITALS — BP 129/68 | HR 73 | Ht 68.0 in | Wt 220.0 lb

## 2018-03-05 DIAGNOSIS — I272 Pulmonary hypertension, unspecified: Secondary | ICD-10-CM

## 2018-03-05 DIAGNOSIS — R0602 Shortness of breath: Secondary | ICD-10-CM | POA: Diagnosis not present

## 2018-03-05 DIAGNOSIS — I4819 Other persistent atrial fibrillation: Secondary | ICD-10-CM

## 2018-03-05 DIAGNOSIS — I10 Essential (primary) hypertension: Secondary | ICD-10-CM | POA: Diagnosis not present

## 2018-03-05 DIAGNOSIS — I340 Nonrheumatic mitral (valve) insufficiency: Secondary | ICD-10-CM | POA: Diagnosis not present

## 2018-03-05 DIAGNOSIS — Z79899 Other long term (current) drug therapy: Secondary | ICD-10-CM

## 2018-03-05 NOTE — Patient Instructions (Addendum)
Medication Instructions:   Your physician recommends that you continue on your current medications as directed. Please refer to the Current Medication list given to you today.  Labwork:  Your physician recommends that you return for lab work in: [place] be done on same day as chest x-ray.  Testing/Procedures:  A chest x-ray takes a picture of the organs and structures inside the chest, including the heart, lungs, and blood vessels. This test can show several things, including, whether the heart is enlarges; whether fluid is building up in the lungs; and whether pacemaker / defibrillator leads are still in place. Your physician has requested that you have an echocardiogram. Echocardiography is a painless test that uses sound waves to create images of your heart. It provides your doctor with information about the size and shape of your heart and how well your heart's chambers and valves are working. This procedure takes approximately one hour. There are no restrictions for this procedure.  Follow-Up:  Your physician recommends that you schedule a follow-up appointment in: 3 months.   Any Other Special Instructions Will Be Listed Below (If Applicable).  If you need a refill on your cardiac medications before your next appointment, please call your pharmacy.

## 2018-03-05 NOTE — Telephone Encounter (Signed)
Precert needed for:  2 D Echo dx: SOB & pulmonary HTN 2 view CXR dx: SOB & pulmonary HTN   Location: [facility]     Date: Mar 08, 2018

## 2018-03-08 ENCOUNTER — Other Ambulatory Visit (HOSPITAL_COMMUNITY)
Admission: RE | Admit: 2018-03-08 | Discharge: 2018-03-08 | Disposition: A | Discharge status: Home / Self Care | Payer: PPO | Source: Ambulatory Visit | Attending: Cardiology | Admitting: Cardiology

## 2018-03-08 ENCOUNTER — Ambulatory Visit (HOSPITAL_COMMUNITY)
Admission: RE | Admit: 2018-03-08 | Discharge: 2018-03-08 | Disposition: A | Discharge status: Home / Self Care | Payer: PPO | Source: Ambulatory Visit | Attending: Cardiology | Admitting: Cardiology

## 2018-03-08 DIAGNOSIS — R0602 Shortness of breath: Secondary | ICD-10-CM | POA: Insufficient documentation

## 2018-03-08 DIAGNOSIS — I272 Pulmonary hypertension, unspecified: Secondary | ICD-10-CM | POA: Diagnosis not present

## 2018-03-08 LAB — CBC
HCT: 36.8 % (ref 36.0–46.0)
Hemoglobin: 11.2 g/dL — ABNORMAL LOW (ref 12.0–15.0)
MCH: 27.1 pg (ref 26.0–34.0)
MCHC: 30.4 g/dL (ref 30.0–36.0)
MCV: 89.1 fL (ref 80.0–100.0)
Platelets: 229 10*3/uL (ref 150–400)
RBC: 4.13 MIL/uL (ref 3.87–5.11)
RDW: 14.4 % (ref 11.5–15.5)
WBC: 5.4 10*3/uL (ref 4.0–10.5)
nRBC: 0 % (ref 0.0–0.2)

## 2018-03-08 LAB — BASIC METABOLIC PANEL
Anion gap: 8 (ref 5–15)
BUN: 16 mg/dL (ref 8–23)
CO2: 28 mmol/L (ref 22–32)
Calcium: 8.5 mg/dL — ABNORMAL LOW (ref 8.9–10.3)
Chloride: 103 mmol/L (ref 98–111)
Creatinine, Ser: 1.34 mg/dL — ABNORMAL HIGH (ref 0.44–1.00)
GFR calc Af Amer: 44 mL/min — ABNORMAL LOW (ref >60)
GFR calc non Af Amer: 38 mL/min — ABNORMAL LOW (ref >60)
Glucose, Bld: 133 mg/dL — ABNORMAL HIGH (ref 70–99)
Potassium: 4.3 mmol/L (ref 3.5–5.1)
Sodium: 139 mmol/L (ref 135–145)

## 2018-03-08 NOTE — Progress Notes (Signed)
*  PRELIMINARY RESULTS* Echocardiogram 2D Echocardiogram has been performed.  Samuel Germany 03/08/2018, 2:53 PM

## 2018-03-09 ENCOUNTER — Telehealth: Payer: Self-pay | Admitting: *Deleted

## 2018-03-09 NOTE — Telephone Encounter (Signed)
-----  Message from Satira Sark, MD sent at 03/08/2018  4:38 PM EST ----- Results reviewed.  Renal function looks better, creatinine was 1.98 down to 1.34.  Potassium normal. A copy of this test should be forwarded to Glenda Chroman, MD.

## 2018-03-09 NOTE — Telephone Encounter (Signed)
-----  Message from Satira Sark, MD sent at 03/08/2018  3:41 PM EST ----- Results reviewed.  Mild anemia, hemoglobin 11.2.  No change in current regimen for now. A copy of this test should be forwarded to Glenda Chroman, MD.

## 2018-03-09 NOTE — Telephone Encounter (Signed)
Patient informed. Copy sent to PCP

## 2018-03-09 NOTE — Telephone Encounter (Signed)
-----  Message from Satira Sark, MD sent at 03/08/2018  4:38 PM EST ----- Results reviewed.  LVEF is normal with indeterminate diastolic function.  Only mild aortic stenosis.  Right ventricle is mildly dilated.  Although not stated in the report, based on provided measurements, PASP looks to be 47 mmHg, overall moderate range pulmonary hypertension and less than previously recorded. A copy of this test should be forwarded to Glenda Chroman, MD.

## 2018-03-26 ENCOUNTER — Other Ambulatory Visit: Payer: Self-pay | Admitting: Pharmacist

## 2018-03-26 ENCOUNTER — Encounter (HOSPITAL_COMMUNITY): Payer: Self-pay | Admitting: Internal Medicine

## 2018-03-26 ENCOUNTER — Encounter

## 2018-03-26 ENCOUNTER — Ambulatory Visit (HOSPITAL_COMMUNITY)
Admission: RE | Admit: 2018-03-26 | Discharge: 2018-03-26 | Disposition: A | Discharge status: Home / Self Care | Payer: PPO | Source: Ambulatory Visit | Attending: Internal Medicine | Admitting: Internal Medicine

## 2018-03-26 VITALS — BP 144/80 | HR 75 | Wt 221.8 lb

## 2018-03-26 DIAGNOSIS — I272 Pulmonary hypertension, unspecified: Secondary | ICD-10-CM | POA: Diagnosis not present

## 2018-03-26 DIAGNOSIS — Z8249 Family history of ischemic heart disease and other diseases of the circulatory system: Secondary | ICD-10-CM | POA: Diagnosis not present

## 2018-03-26 DIAGNOSIS — E859 Amyloidosis, unspecified: Secondary | ICD-10-CM

## 2018-03-26 DIAGNOSIS — Z79899 Other long term (current) drug therapy: Secondary | ICD-10-CM | POA: Insufficient documentation

## 2018-03-26 DIAGNOSIS — J449 Chronic obstructive pulmonary disease, unspecified: Secondary | ICD-10-CM | POA: Diagnosis not present

## 2018-03-26 DIAGNOSIS — Z9981 Dependence on supplemental oxygen: Secondary | ICD-10-CM | POA: Diagnosis not present

## 2018-03-26 DIAGNOSIS — I4821 Permanent atrial fibrillation: Secondary | ICD-10-CM | POA: Insufficient documentation

## 2018-03-26 DIAGNOSIS — E782 Mixed hyperlipidemia: Secondary | ICD-10-CM | POA: Diagnosis not present

## 2018-03-26 DIAGNOSIS — Z7984 Long term (current) use of oral hypoglycemic drugs: Secondary | ICD-10-CM | POA: Diagnosis not present

## 2018-03-26 DIAGNOSIS — E119 Type 2 diabetes mellitus without complications: Secondary | ICD-10-CM | POA: Insufficient documentation

## 2018-03-26 DIAGNOSIS — Z8711 Personal history of peptic ulcer disease: Secondary | ICD-10-CM | POA: Diagnosis not present

## 2018-03-26 DIAGNOSIS — Z87891 Personal history of nicotine dependence: Secondary | ICD-10-CM | POA: Diagnosis not present

## 2018-03-26 DIAGNOSIS — I110 Hypertensive heart disease with heart failure: Secondary | ICD-10-CM | POA: Diagnosis not present

## 2018-03-26 DIAGNOSIS — Z7901 Long term (current) use of anticoagulants: Secondary | ICD-10-CM | POA: Diagnosis not present

## 2018-03-26 DIAGNOSIS — I10 Essential (primary) hypertension: Secondary | ICD-10-CM

## 2018-03-26 LAB — BASIC METABOLIC PANEL
Anion gap: 9 (ref 5–15)
BUN: 17 mg/dL (ref 8–23)
CO2: 30 mmol/L (ref 22–32)
Calcium: 9.1 mg/dL (ref 8.9–10.3)
Chloride: 102 mmol/L (ref 98–111)
Creatinine, Ser: 1.38 mg/dL — ABNORMAL HIGH (ref 0.44–1.00)
GFR calc Af Amer: 42 mL/min — ABNORMAL LOW (ref >60)
GFR calc non Af Amer: 36 mL/min — ABNORMAL LOW (ref >60)
Glucose, Bld: 111 mg/dL — ABNORMAL HIGH (ref 70–99)
Potassium: 4.4 mmol/L (ref 3.5–5.1)
Sodium: 141 mmol/L (ref 135–145)

## 2018-03-26 MED ORDER — HYDRALAZINE HCL 25 MG PO TABS: 50.0000 mg | ORAL_TABLET | Freq: Three times a day (TID) | ORAL | 3 refills | Status: DC

## 2018-03-26 NOTE — Addendum Note (Signed)
Encounter addended by: Jolaine Artist, MD on: 03/26/2018 12:09 PM  Actions taken: Clinical Note Signed

## 2018-03-26 NOTE — Patient Outreach (Signed)
[facility]) Care Management  [place] - Medication Adherence   03/26/2018  DORREEN VALIENTE [birth year] [id number]  [facility] Medication: atorvastatin 10 mg Date & Supply of last refill: 10/30/17, 30 day supply Current insurance:[employer]   Outreach:  Incoming call from [name] in response to the [facility] Medication Adherence Campaign. Speak with patient. HIPAA identifiers verified.  Subjective:   Ms. Clements reports that she has not been taking atorvastatin. Reviews her medication list, which she reports that she has handwritten. Reports that she uses this list to take to each of her medical appointments and that atorvastatin is not on this list. Patient does not recall having been taken off of the medication or when she was last on it. Note that per chart, patient last had the medication refilled on 10/30/17 for a 30 day supply. Medication is listed in chart for Dr. Clayborne Dana (Cardiologist) office. However, note that most recent refill of the medication came from patient's PCP office. Let patient know that I will call to follow up with her provider about this medication.  Ms. Carrasco denies any further medication questions/concerns at this time.   Objective: Lab Results  Component Value Date   CREATININE 1.38 (H) 03/26/2018   CREATININE 1.34 (H) 03/08/2018   CREATININE 1.98 (H) 08/28/2014    No results found for: HGBA1C  Lipid Panel  No results found for: CHOL, TRIG, HDL, CHOLHDL, VLDL, LDLCALC, LDLDIRECT  BP Readings from Last 3 Encounters:  03/26/18 (!) 144/80  03/05/18 129/68  12/03/17 (!) 144/70    Allergies  Allergen Reactions  . Gluten Diarrhea  . Hydromorphone Hcl Other (See Comments)    REACTION: lowers BP levels  . Prednisone Other (See Comments)    DOES NOT LIKE SIDE EFFECTS, INTERACTION WITH WARFARIN     Assessment:  Barrier identified: . Does not currently have medication - Will follow up with  PCP   Plan:  1) Call to follow up with patient's PCP office. Speak with Sharyn Lull in the office who states that atorvastatin 10 mg still appears on the patient's medication list in their charting system. States that she will follow up with the provider to confirm that patient is still supposed to be taking this medication and then will follow up with the patient directly.  2) I will call to follow up with provider office/patient next week.   Harlow Asa, PharmD, [facility] [phone]

## 2018-03-26 NOTE — Progress Notes (Addendum)
ADVANCED HF CONSULT NOTE  Referring: Dr. Woody Seller Cardiologist: Dr. Domenic Polite   Patient ID: Wanda [name], female   DOB: 1940-04-13, 78 y.o.   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 We last saw her in 2014. She is referred back by Dr. Woody Seller due to [facility] and respiratory failure.   She has a complicated PAH history.   Found to be hypoxemic in Dec 2010 started on O2. CT chest 12/10: No PE. Elevation of R hemidiaphragm. Areas of centrilobular emphysema. atelectasis in both bases. Large left lobe of thyroid with trachea deviation.  Cath 2006 no significant CAD  Echo 01/23/09: 55-60% mild LVH with pseudonormalizaiton. Mild to moderate MR. RV mild to moderately dilated. Mild RV dysfunction. Mild AS mean 28m HG.  RSVP 67. PFTs FEV1 1.23L (50%) FEV 1.81 (53%) DLCO 43% predicted.  Cath 1/11: RA 11, RV 84/6 with an EDP of 18, PA 83/28 with a mean of 49. PCWP 15  Fick cardiac output 7.0 L/min.  Cardiac index 3.0  PVR is 4.9 Woods units.  Started on Tyvaso (treprostinil) in March 2011. Repeat RHC 11/11 with marked improvement  of pressures. RA 4 RV 43/4 with EDP of 10.  PAP 42/15 with a mean of 25.  PCWP 9 Fick cardiac output 4.7.  Cardiac index 2.1.  PVR 3.4 Woods units.  Femoral artery saturation is 93% on 3 L of supplemental oxygen.  PA saturations were 53% and 57%.  Off Tyvaso August 2013. ECHO 12/26/2011 EF 55-60% Peak PA pressure 46  In 11/19 had episode of severe epistaxis. Coumadin stopped (was on for chronic AF). Eventually switched to Eliquis. No further bleeding. Since that time started on home O2. Remains on 3-4L/min. Followed by [facility]. Remains relatively active. No CP. Mild DOE. No edema, orthopnea or PND. No syncope. No LE edema. BP has been 140-160s   Echo 03/08/18: EF 65-70% mild AS. RVSP 45-50. 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 Personally reviewed  Review of Systems: [y] = yes, _0  = no    General: Weight gain _1 ; Weight loss _2 ; Anorexia _3 ; Fatigue _4 ; Fever _5 ; Chills _6 ; Weakness [Blue.Reese]   Cardiac: Chest pain/pressure _7 ; Resting SOB _8 ; Exertional SOB [y]; Orthopnea _9 ; Pedal Edema _10 ; Palpitations _11 ; Syncope _12 ; Presyncope _13 ; Paroxysmal nocturnal dyspnea_14    Pulmonary: Cough _15 ; Wheezing_16 ; Hemoptysis_17 ; Sputum _18 ; Snoring _19    GI: Vomiting_20 ; Dysphagia_21 ; Melena_22 ; Hematochezia _23 ; Heartburn_24 ; Abdominal pain _25 ; Constipation _26 ; Diarrhea _27 ; BRBPR _28    GU: Hematuria_29 ; Dysuria _30 ; Nocturia_31   Vascular: Pain in legs with walking _32 ; Pain in feet with lying flat _33 ; Non-healing sores _34 ; Stroke _35 ; TIA _36 ; Slurred speech _37 ;   Neuro: Headaches_38 ; Vertigo_39 ; Seizures_40 ; Paresthesias_41 ;Blurred vision _42 ; Diplopia _43 ; Vision changes _44    Ortho/Skin: Arthritis [y]; Joint pain [y]; Muscle pain _45 ; Joint swelling _46 ; Back Pain _47 ; Rash _48    Psych: Depression_49 ; Anxiety_50    Heme: Bleeding problems _51 ; Clotting disorders _52 ; Anemia _53    Endocrine: Diabetes [Blue.Reese]; Thyroid dysfunction_54    Past Medical History:  Diagnosis Date  .  Celiac disease   . COPD (chronic obstructive pulmonary disease) ([place])   . Essential hypertension, benign   . Goiter   . History of breast cancer    Status post mastectomy  . History of cardiac catheterization    No significant CAD 2006  . Mixed hyperlipidemia   . Paroxysmal atrial fibrillation (HCC)    Coumadin therapy  . PUD (peptic ulcer disease)   . Pulmonary hypertension ([place])   . Type 2 diabetes mellitus ([place])     Current Outpatient Medications  Medication Sig Dispense Refill  . ALPRAZolam (XANAX) 1 MG tablet Take 1 mg by mouth 2 (two) times daily as needed for anxiety or sleep.     Marland Kitchen apixaban (ELIQUIS) 5 MG TABS tablet Take 1 tablet (5 mg total) by mouth 2 (two)  times daily. 60 tablet 11  . carvedilol (COREG) 25 MG tablet Take 1 tablet by mouth 2 (two) times daily.    Marland Kitchen diltiazem (CARDIZEM CD) 240 MG 24 hr capsule TAKE ONE CAPSULE BY MOUTH DAILY 90 capsule 1  . glimepiride (AMARYL) 2 MG tablet Take 2 mg by mouth every morning.    . hydrALAZINE (APRESOLINE) 25 MG tablet Take 25 mg by mouth 3 (three) times daily.  3  . lisinopril (PRINIVIL,ZESTRIL) 10 MG tablet Take 10 mg by mouth 2 (two) times daily.     Marland Kitchen trimethoprim (TRIMPEX) 100 MG tablet Take 100 mg by mouth at bedtime.    Marland Kitchen atorvastatin (LIPITOR) 10 MG tablet Take 10 mg by mouth every evening.     . baclofen (LIORESAL) 10 MG tablet Take 10 mg by mouth 3 (three) times daily. Rarely takes.     No current facility-administered medications for this encounter.    Social History   Socioeconomic History  . Marital status: Widowed    Spouse name: Not on file  . Number of children: Not on file  . Years of education: Not on file  . Highest education level: Not on file  Occupational History    Employer: RETIRED    Comment: Retired former Therapist, art at [employer]  . Financial resource strain: Not on file  . Food insecurity:    Worry: Not on file    Inability: Not on file  . Transportation needs:    Medical: Not on file    Non-medical: Not on file  Tobacco Use  . Smoking status: Former Smoker    Types: Cigarettes    Last attempt to quit: 02/11/1995    Years since quitting: 23.1  . Smokeless tobacco: Never Used  Substance and Sexual Activity  . Alcohol use: Yes    Alcohol/week: 0.0 standard drinks    Comment: occ  . Drug use: No  . Sexual activity: Not on file  Lifestyle  . Physical activity:    Days per week: Not on file    Minutes per session: Not on file  . Stress: Not on file  Relationships  . Social connections:    Talks on phone: Not on file    Gets together: Not on file    Attends religious service: Not on file    Active member of club or organization: Not on  file    Attends meetings of clubs or organizations: Not on file    Relationship status: Not on file  Other Topics Concern  . Not on file  Social History Narrative  . Not on file   Family History  Problem Relation Age of Onset  .  Heart attack Mother   . Celiac disease Brother   . CVA Maternal Grandmother   . Aortic aneurysm Maternal Grandfather     PHYSICAL EXAM: Vitals:   03/26/18 0928  BP: (!) 144/80  Pulse: 75  SpO2: 94%    General:  Sitting in WC on O2 HEENT: normal Neck: supple. CVP 7. Carotids 2+ bilat; no bruits. No lymphadenopathy or thryomegaly appreciated. Cor: PMI nondisplaced. Irregular rate & rhythm. 2/6 AS Lungs: clear Abdomen: obese soft, nontender, nondistended. No hepatosplenomegaly. No bruits or masses. Good bowel sounds. Extremities: no cyanosis, clubbing, rash, edema Neuro: alert & orientedx3, cranial nerves grossly intact. moves all 4 extremities w/o difficulty. Affect pleasant   ASSESSMENT & PLAN:  1. 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 - Check PYP scan to exclude TTR amyloid  3. A Fib, permanent - Rate Controlled. On cardizem. - Tolerating Eliquis well  4. COPD - Stable. On Home O2. Have reached out to [facility] to see if they can help her get portable tank - Check PFTs/DLCO - Refer to Pulmonary Rehab at [facility]  5. HTN - BP elevated.  - Increase hydralazine to 50 tid  6. DM2 - We discussed possibility of Jardiance but given self-cathing will not use SGLT2i   Antavion Bartoszek,MD 10:20 AM

## 2018-03-26 NOTE — Patient Instructions (Signed)
Labs were done today. We will call you with any ABNORMAL results. No news is good news!  You have been referred to complete a Pulmonary Functions Test, this office will call to schedule an appointment.   You have been referred to Pulmonary Rehabilitation, this office will call to schedule an appointment.   You have been referred to complete a Sleep Study, this office will call to schedule an appointment.   INCREASE Hydralazine to 17m three times a day.  Your physician wants you to follow-up in: 6 MONTHS You will receive a reminder letter in the mail two months in advance. If you don't receive a letter, please call our office to schedule the follow-up appointment.

## 2018-03-29 ENCOUNTER — Other Ambulatory Visit: Payer: Self-pay | Admitting: Pharmacist

## 2018-03-29 NOTE — Patient Outreach (Signed)
[facility]) Care Management  03/29/2018  SHEILLA MARIS Jun 12, 1940 [id number]   Call again to follow up with patient's PCP office. Speak with Sharyn Lull states that Dr. Woody Seller received my message and confirmed that he wanted the patient to take the atorvastatin 10 mg and that he sent in a new prescription for this medication into the patient's pharmacy. Sharyn Lull states that she will call to follow up with Ms. Stansel directly.   PLAN  Will close pharmacy episode at this time.  Harlow Asa, PharmD, [facility] [phone]

## 2018-03-30 LAB — MULTIPLE MYELOMA PANEL, SERUM
Albumin SerPl Elph-Mcnc: 3.6 g/dL (ref 2.9–4.4)
Albumin/Glob SerPl: 1.6 (ref 0.7–1.7)
Alpha 1: 0.2 g/dL (ref 0.0–0.4)
Alpha2 Glob SerPl Elph-Mcnc: 0.5 g/dL (ref 0.4–1.0)
B-Globulin SerPl Elph-Mcnc: 0.9 g/dL (ref 0.7–1.3)
Gamma Glob SerPl Elph-Mcnc: 0.7 g/dL (ref 0.4–1.8)
Globulin, Total: 2.4 g/dL (ref 2.2–3.9)
IGM (IMMUNOGLOBULIN M), SRM: 151 mg/dL (ref 26–217)
IgA: 185 mg/dL (ref 64–422)
IgG (Immunoglobin G), Serum: 758 mg/dL (ref 700–1600)
Total Protein ELP: 6 g/dL (ref 6.0–8.5)

## 2018-04-03 DIAGNOSIS — I445 Left posterior fascicular block: Secondary | ICD-10-CM | POA: Diagnosis not present

## 2018-04-09 DIAGNOSIS — R339 Retention of urine, unspecified: Secondary | ICD-10-CM | POA: Diagnosis not present

## 2018-04-15 ENCOUNTER — Emergency Department (HOSPITAL_COMMUNITY)
Admission: EM | Admit: 2018-04-15 | Discharge: 2018-04-15 | Disposition: A | Discharge status: Home / Self Care | Payer: PPO | Attending: Emergency Medicine | Admitting: Emergency Medicine

## 2018-04-15 ENCOUNTER — Other Ambulatory Visit: Payer: Self-pay

## 2018-04-15 ENCOUNTER — Emergency Department (HOSPITAL_COMMUNITY): Payer: PPO

## 2018-04-15 ENCOUNTER — Encounter (HOSPITAL_COMMUNITY): Payer: Self-pay | Admitting: Emergency Medicine

## 2018-04-15 DIAGNOSIS — Z853 Personal history of malignant neoplasm of breast: Secondary | ICD-10-CM | POA: Diagnosis not present

## 2018-04-15 DIAGNOSIS — R05 Cough: Secondary | ICD-10-CM | POA: Insufficient documentation

## 2018-04-15 DIAGNOSIS — E119 Type 2 diabetes mellitus without complications: Secondary | ICD-10-CM | POA: Diagnosis not present

## 2018-04-15 DIAGNOSIS — H5789 Other specified disorders of eye and adnexa: Secondary | ICD-10-CM | POA: Diagnosis present

## 2018-04-15 DIAGNOSIS — Z79899 Other long term (current) drug therapy: Secondary | ICD-10-CM | POA: Diagnosis not present

## 2018-04-15 DIAGNOSIS — Z7984 Long term (current) use of oral hypoglycemic drugs: Secondary | ICD-10-CM | POA: Insufficient documentation

## 2018-04-15 DIAGNOSIS — H1131 Conjunctival hemorrhage, right eye: Secondary | ICD-10-CM

## 2018-04-15 DIAGNOSIS — H538 Other visual disturbances: Secondary | ICD-10-CM | POA: Diagnosis not present

## 2018-04-15 DIAGNOSIS — Z7901 Long term (current) use of anticoagulants: Secondary | ICD-10-CM | POA: Diagnosis not present

## 2018-04-15 DIAGNOSIS — I10 Essential (primary) hypertension: Secondary | ICD-10-CM | POA: Insufficient documentation

## 2018-04-15 DIAGNOSIS — J449 Chronic obstructive pulmonary disease, unspecified: Secondary | ICD-10-CM | POA: Diagnosis not present

## 2018-04-15 DIAGNOSIS — Z87891 Personal history of nicotine dependence: Secondary | ICD-10-CM | POA: Insufficient documentation

## 2018-04-15 NOTE — ED Provider Notes (Signed)
[facility] EMERGENCY DEPARTMENT Provider Note   CSN: [id number] Arrival date & time: 04/15/18  1731    History   Chief Complaint Chief Complaint  Patient presents with  . Medication Reaction    HPI Wanda [name] is a 78 y.o. female.     HPI 3 months ago patient was switched from warfarin to Eliquis for her paroxysmal atrial fibrillation.  She began having clear watery discharge of the right eye with some nasal congestion and coughing yesterday.  Today family noticed that she had redness to the right eye which is worsened throughout the night.  Patient states she normally has poor vision in the right eye at baseline and this is relatively unchanged.  She denies any pain.  No definite trauma to the eye or head.  Denies headache or neck pain.  No focal weakness or numbness. Past Medical History:  Diagnosis Date  . Celiac disease   . COPD (chronic obstructive pulmonary disease) ([place])   . Essential hypertension, benign   . Goiter   . History of breast cancer    Status post mastectomy  . History of cardiac catheterization    No significant CAD 2006  . Mixed hyperlipidemia   . Paroxysmal atrial fibrillation (HCC)    Coumadin therapy  . PUD (peptic ulcer disease)   . Pulmonary hypertension ([place])   . Type 2 diabetes mellitus [facility])     Patient Active Problem List   Diagnosis Date Noted  . Atrial fibrillation ([place])   . Pulmonary hypertension ([place])   . Long term (current) use of anticoagulants 05/03/2010  . HYPERLIPIDEMIA-MIXED 01/26/2009  . Essential hypertension, benign 01/26/2009  . PREMATURE VENTRICULAR CONTRACTIONS 01/26/2009    Past Surgical History:  Procedure Laterality Date  . DILATION AND CURETTAGE OF UTERUS    . HYSTEROSCOPY W/ ENDOMETRIAL ABLATION     ThermaChoice  . MASTECTOMY       OB History   No obstetric history on file.      Home Medications    Prior to Admission medications   Medication Sig Start Date End Date Taking? Authorizing Provider    ALPRAZolam Duanne Moron) 1 MG tablet Take 1 mg by mouth at bedtime.    Yes [provider]  apixaban (ELIQUIS) 5 MG TABS tablet Take 1 tablet (5 mg total) by mouth 2 (two) times daily. 12/03/17  Yes Satira Sark, MD  atorvastatin (LIPITOR) 10 MG tablet Take 10 mg by mouth every evening.    Yes [provider]  carvedilol (COREG) 25 MG tablet Take 25 mg by mouth 2 (two) times daily with a meal.  02/26/18  Yes [provider]  diltiazem (CARDIZEM CD) 240 MG 24 hr capsule TAKE ONE CAPSULE BY MOUTH DAILY Patient taking differently: Take 240 mg by mouth every morning.  01/18/18  Yes Satira Sark, MD  glimepiride (AMARYL) 2 MG tablet Take 2 mg by mouth every morning. 02/11/18  Yes [provider]  hydrALAZINE (APRESOLINE) 25 MG tablet Take 2 tablets (50 mg total) by mouth 3 (three) times daily. 03/26/18  Yes Bensimhon, Shaune Pascal, MD  lisinopril (PRINIVIL,ZESTRIL) 10 MG tablet Take 10 mg by mouth 2 (two) times daily.    Yes [provider]  trimethoprim (TRIMPEX) 100 MG tablet Take 100 mg by mouth at bedtime.   Yes [provider]    Family History Family History  Problem Relation Age of Onset  . Heart attack Mother   . Celiac disease Brother   .  CVA Maternal Grandmother   . Aortic aneurysm Maternal Grandfather     Social History Social History   Tobacco Use  . Smoking status: Former Smoker    Types: Cigarettes    Last attempt to quit: 02/11/1995    Years since quitting: 23.1  . Smokeless tobacco: Never Used  Substance Use Topics  . Alcohol use: Yes    Alcohol/week: 0.0 standard drinks    Comment: occ  . Drug use: No     Allergies   Gluten; Hydromorphone hcl; and Prednisone   Review of Systems Review of Systems  Constitutional: Negative for chills and fever.  HENT: Positive for congestion and ear discharge. Negative for ear pain, facial swelling, sore throat and trouble swallowing.   Eyes: Positive for discharge and redness.  Negative for photophobia, pain, itching and visual disturbance.  Respiratory: Positive for cough. Negative for shortness of breath.   Cardiovascular: Negative for chest pain, palpitations and leg swelling.  Gastrointestinal: Negative for abdominal pain, diarrhea, nausea and vomiting.  Genitourinary: Negative for dysuria, flank pain and frequency.  Musculoskeletal: Negative for back pain, myalgias, neck pain and neck stiffness.  Skin: Negative for rash and wound.  Neurological: Negative for dizziness, speech difficulty, weakness, light-headedness, numbness and headaches.  All other systems reviewed and are negative.    Physical Exam Updated Vital Signs BP (!) 169/93   Pulse 66   Temp 98.2 F (36.8 C) (Oral)   Resp 16   Ht _0  (1.727 m)   Wt 99.8 kg   SpO2 97%   BMI 33.45 kg/m   Physical Exam Vitals signs and nursing note reviewed.  Constitutional:      General: She is not in acute distress.    Appearance: Normal appearance. She is well-developed. She is not ill-appearing.  HENT:     Head: Normocephalic and atraumatic.     Nose: Congestion present.     Mouth/Throat:     Mouth: Mucous membranes are moist.     Pharynx: No oropharyngeal exudate or posterior oropharyngeal erythema.  Eyes:     Extraocular Movements: Extraocular movements intact.     Pupils: Pupils are equal, round, and reactive to light.     Comments: Patient with subconjunctival hemorrhage over the lateral sclera of the right eye.  Anterior chamber without hyphema.  No evidence of foreign body.  Pupils are equal round reactive to light.  No photophobia.  Neck:     Musculoskeletal: Normal range of motion and neck supple.  Cardiovascular:     Rate and Rhythm: Normal rate and regular rhythm.     Heart sounds: No murmur. No gallop.   Pulmonary:     Effort: Pulmonary effort is normal.     Breath sounds: Rhonchi present.     Comments: Few scattered rhonchi.  No respiratory distress. Abdominal:     General:  Bowel sounds are normal. There is no distension.     Palpations: Abdomen is soft.     Tenderness: There is no abdominal tenderness. There is no right CVA tenderness, left CVA tenderness, guarding or rebound.  Musculoskeletal: Normal range of motion.        General: No swelling, tenderness, deformity or signs of injury.     Right lower leg: No edema.     Left lower leg: No edema.  Skin:    General: Skin is warm and dry.     Capillary Refill: Capillary refill takes less than 2 seconds.     Findings: No erythema or  rash.  Neurological:     General: No focal deficit present.     Mental Status: She is alert and oriented to person, place, and time.  Psychiatric:        Mood and Affect: Mood normal.        Behavior: Behavior normal.      ED Treatments / Results  Labs (all labs ordered are listed, but only abnormal results are displayed) Labs Reviewed - No data to display  EKG None  Radiology Dg Chest 2 View  Result Date: 04/15/2018 CLINICAL DATA:  Blood in right eye, pulmonary hypertension COPD EXAM: CHEST - 2 VIEW COMPARISON:  03/08/2018, 08/21/2017 FINDINGS: No acute opacity or pleural effusion. Coarse chronic interstitial changes. Borderline to mild cardiomegaly. Generous central pulmonary arteries. Aortic atherosclerosis. No pneumothorax. Mild reticular changes at the bases on lateral view, consistent with fibrosis. IMPRESSION: 1. Chronic interstitial lung changes with atelectasis or scarring at both bases. 2. Mild cardiomegaly. Prominent central pulmonary arteries, may correlate to history of pulmonary hypertension Electronically Signed   By: Donavan Foil M.D.   On: 04/15/2018 19:37   Ct Head Wo Contrast  Result Date: 04/15/2018 CLINICAL DATA:  78 y/o F; blood in right eye with vision changes. Patient on Eliquis for 3 months. EXAM: CT HEAD WITHOUT CONTRAST TECHNIQUE: Contiguous axial images were obtained from the base of the skull through the vertex without intravenous contrast.  COMPARISON:  None. FINDINGS: Brain: No evidence of acute infarction, hemorrhage, hydrocephalus, extra-axial collection or mass lesion/mass effect. Nonspecific white matter hypodensities are compatible with mild to moderate chronic microvascular ischemic changes and there is volume loss of the brain. Vascular: Calcific atherosclerosis of the carotid siphons. No hyperdense vessel identified. Skull: Normal. Negative for fracture or focal lesion. Sinuses/Orbits: Partial opacification of right anterior ethmoid air cells. Additional visible paranasal sinuses and the mastoid air cells are normally aerated. Other: None. IMPRESSION: 1. No acute intracranial abnormality identified. Visible orbital compartments are unremarkable. 2. Mild-to-moderate chronic microvascular ischemic changes and volume loss of the brain. Electronically Signed   By: Kristine Garbe M.D.   On: 04/15/2018 21:09    Procedures Procedures (including critical care time)  Medications Ordered in ED Medications - No data to display   Initial Impression / Assessment and Plan / ED Course  I have reviewed the triage vital signs and the nursing notes.  Pertinent labs & imaging results that were available during my care of the patient were reviewed by me and considered in my medical decision making (see chart for details).       Patient likely developed conjunctivitis in the right eye.  Possibly rubbing caused some conjunctival hemorrhage.  No visual changes.  Patient was concerned about possible worsening intracranial bleeding and requesting CT scan.  CT performed without signs of active bleeding.  Patient does have some sinus disease.  Advised to hold Eliquis for 1 day until bleeding is not worsening in the right eye and then she can resume.  Strict return precautions have been given.   Final Clinical Impressions(s) / ED Diagnoses   Final diagnoses:  Subconjunctival bleed, right    ED Discharge Orders    None         Julianne Rice, MD 04/15/18 2119

## 2018-04-15 NOTE — ED Notes (Signed)
Patient transported to CT

## 2018-04-15 NOTE — ED Triage Notes (Signed)
Pt concerned she is having a medication reaction to eliquis. Pt c/o blood in RT eye. Denies injury. Family states they saw her this afternoon and it was a little red, but bleeding worsened when they saw her this evening. Pt reports some vision changes to RT eye. Pt has been on eliquis x 3 months.

## 2018-04-15 NOTE — Discharge Instructions (Addendum)
Hold Eliquis for 1 day.  Resume if no worsening bleeding.  Return for eye pain, visual changes or for any concerns.

## 2018-04-21 ENCOUNTER — Ambulatory Visit (HOSPITAL_COMMUNITY)
Admission: RE | Admit: 2018-04-21 | Discharge: 2018-04-21 | Disposition: A | Discharge status: Home / Self Care | Payer: PPO | Source: Ambulatory Visit | Attending: Internal Medicine | Admitting: Internal Medicine

## 2018-04-21 ENCOUNTER — Other Ambulatory Visit: Payer: Self-pay

## 2018-04-21 DIAGNOSIS — I272 Pulmonary hypertension, unspecified: Secondary | ICD-10-CM | POA: Diagnosis not present

## 2018-04-21 LAB — PULMONARY FUNCTION TEST
DL/VA % pred: 58 %
DL/VA: 2.34 ml/min/mmHg/L
DLCO UNC % PRED: 48 %
DLCO unc: 10.4 ml/min/mmHg
FEF 25-75 Post: 0.73 L/sec
FEF 25-75 Pre: 0.53 L/sec
FEF2575-%Change-Post: 37 %
FEF2575-%Pred-Post: 43 %
FEF2575-%Pred-Pre: 31 %
FEV1-%Change-Post: 8 %
FEV1-%Pred-Post: 72 %
FEV1-%Pred-Pre: 66 %
FEV1-Post: 1.71 L
FEV1-Pre: 1.58 L
FEV1FVC-%Change-Post: 0 %
FEV1FVC-%Pred-Pre: 74 %
FEV6-%Change-Post: 8 %
FEV6-%PRED-PRE: 85 %
FEV6-%Pred-Post: 93 %
FEV6-Post: 2.79 L
FEV6-Pre: 2.57 L
FEV6FVC-%Change-Post: 1 %
FEV6FVC-%Pred-Post: 95 %
FEV6FVC-%Pred-Pre: 93 %
FVC-%Change-Post: 7 %
FVC-%Pred-Post: 97 %
FVC-%Pred-Pre: 91 %
FVC-Post: 3.09 L
FVC-Pre: 2.88 L
Post FEV1/FVC ratio: 55 %
Post FEV6/FVC ratio: 90 %
Pre FEV1/FVC ratio: 55 %
Pre FEV6/FVC Ratio: 89 %

## 2018-04-21 MED ORDER — ALBUTEROL SULFATE (2.5 MG/3ML) 0.083% IN NEBU
2.5000 mg | INHALATION_SOLUTION | Freq: Once | RESPIRATORY_TRACT | Status: AC
  Administered 2018-04-21: 2.5 mg via RESPIRATORY_TRACT

## 2018-04-22 ENCOUNTER — Encounter (HOSPITAL_COMMUNITY)
Admission: RE | Admit: 2018-04-22 | Discharge: 2018-04-22 | Disposition: A | Discharge status: Home / Self Care | Payer: PPO | Source: Ambulatory Visit | Attending: Internal Medicine | Admitting: Internal Medicine

## 2018-04-22 DIAGNOSIS — E859 Amyloidosis, unspecified: Secondary | ICD-10-CM | POA: Insufficient documentation

## 2018-04-22 DIAGNOSIS — I509 Heart failure, unspecified: Secondary | ICD-10-CM | POA: Diagnosis not present

## 2018-04-22 MED ORDER — TECHNETIUM TC 99M PYROPHOSPHATE
25.0000 | Freq: Once | INTRAVENOUS | Status: AC | PRN
  Administered 2018-04-22: 25 via INTRAVENOUS

## 2018-04-29 DIAGNOSIS — R339 Retention of urine, unspecified: Secondary | ICD-10-CM | POA: Diagnosis not present

## 2018-05-02 DIAGNOSIS — I445 Left posterior fascicular block: Secondary | ICD-10-CM | POA: Diagnosis not present

## 2018-05-31 DIAGNOSIS — R339 Retention of urine, unspecified: Secondary | ICD-10-CM | POA: Diagnosis not present

## 2018-06-01 DIAGNOSIS — J449 Chronic obstructive pulmonary disease, unspecified: Secondary | ICD-10-CM | POA: Diagnosis not present

## 2018-06-01 DIAGNOSIS — J029 Acute pharyngitis, unspecified: Secondary | ICD-10-CM | POA: Diagnosis not present

## 2018-06-01 DIAGNOSIS — Z6832 Body mass index (BMI) 32.0-32.9, adult: Secondary | ICD-10-CM | POA: Diagnosis not present

## 2018-06-01 DIAGNOSIS — Z299 Encounter for prophylactic measures, unspecified: Secondary | ICD-10-CM | POA: Diagnosis not present

## 2018-06-01 DIAGNOSIS — E1122 Type 2 diabetes mellitus with diabetic chronic kidney disease: Secondary | ICD-10-CM | POA: Diagnosis not present

## 2018-06-01 DIAGNOSIS — I270 Primary pulmonary hypertension: Secondary | ICD-10-CM | POA: Diagnosis not present

## 2018-06-02 ENCOUNTER — Encounter: Payer: Self-pay | Admitting: Cardiology

## 2018-06-02 ENCOUNTER — Telehealth: Payer: Self-pay | Admitting: *Deleted

## 2018-06-02 DIAGNOSIS — I445 Left posterior fascicular block: Secondary | ICD-10-CM | POA: Diagnosis not present

## 2018-06-02 NOTE — Progress Notes (Signed)
Virtual Visit via Video Note   This visit type was conducted due to national recommendations for restrictions regarding the COVID-19 Pandemic (e.g. social distancing) in an effort to limit this patient's exposure and mitigate transmission in our community.  Due to her co-morbid illnesses, this patient is at least at moderate risk for complications without adequate follow up.  This format is felt to be most appropriate for this patient at this time.  All issues noted in this document were discussed and addressed.  A limited physical exam was performed with this format.  Please refer to the patient's chart for her consent to telehealth for [facility].   Evaluation Performed:  Follow-up visit  Date:  06/03/2018   ID:  Wanda [name], DOB 12-12-1940, MRN [id number]   Patient Location: Home Provider Location: Office  PCP:  Glenda Chroman, MD  Cardiologist:  Rozann Lesches, MD  Chief Complaint:  Cardiac follow-up  History of Present Illness:    Wanda [name] is a 78 y.o. female last seen in January.  We communicated via video conferencing today.  She has been staying around her house, only goes out once a week to get groceries which are loaded in her car after ordering online.  She remains on supplemental oxygen via nasal cannula with stable NYHA class III dyspnea.  She does not report any palpitations or chest pain.  She had follow-up with Dr. Haroldine Laws in February for pulmonary hypertension, I reviewed the note.  She continues on supplemental oxygen and medical therapy, did undergo a PYP scan that was not suggestive of transthyretin amyloidosis.  PFTs also ordered with referral to pulmonary rehabilitation.  I reviewed her lab work from [place].  She has had no bleeding problems on Eliquis.  The patient does not have symptoms concerning for COVID-19 infection (fever, chills, cough, or new shortness of breath).    Past Medical History:  Diagnosis Date  . Celiac disease    . COPD (chronic obstructive pulmonary disease) ([place])   . Essential hypertension   . Goiter   . History of breast cancer    Status post mastectomy  . History of cardiac catheterization    No significant CAD 2006  . Mixed hyperlipidemia   . Paroxysmal atrial fibrillation (HCC)    Coumadin therapy  . PUD (peptic ulcer disease)   . Pulmonary hypertension ([place])   . Type 2 diabetes mellitus ([place])    Past Surgical History:  Procedure Laterality Date  . DILATION AND CURETTAGE OF UTERUS    . HYSTEROSCOPY W/ ENDOMETRIAL ABLATION     ThermaChoice  . MASTECTOMY       Current Meds  Medication Sig  . ALPRAZolam (XANAX) 1 MG tablet Take 1 mg by mouth at bedtime.   Marland Kitchen apixaban (ELIQUIS) 5 MG TABS tablet Take 1 tablet (5 mg total) by mouth 2 (two) times daily.  Marland Kitchen atorvastatin (LIPITOR) 10 MG tablet Take 10 mg by mouth every evening.   . carvedilol (COREG) 25 MG tablet Take 25 mg by mouth 2 (two) times daily with a meal.   . ciprofloxacin (CIPRO) 500 MG tablet Take 500 mg by mouth 2 (two) times daily.  Marland Kitchen diltiazem (CARDIZEM CD) 240 MG 24 hr capsule TAKE ONE CAPSULE BY MOUTH DAILY (Patient taking differently: Take 240 mg by mouth every morning. )  . glimepiride (AMARYL) 2 MG tablet Take 2 mg by mouth every morning.  . hydrALAZINE (APRESOLINE) 25 MG tablet Take 2 tablets (50 mg total) by mouth  3 (three) times daily.  Marland Kitchen lisinopril (PRINIVIL,ZESTRIL) 10 MG tablet Take 10 mg by mouth 2 (two) times daily.   Marland Kitchen trimethoprim (TRIMPEX) 100 MG tablet Take 100 mg by mouth at bedtime.     Allergies:   Gluten; Hydromorphone hcl; and Prednisone   Social History   Tobacco Use  . Smoking status: Former Smoker    Types: Cigarettes    Last attempt to quit: 02/11/1995    Years since quitting: 23.3  . Smokeless tobacco: Never Used  Substance Use Topics  . Alcohol use: Yes    Alcohol/week: 0.0 standard drinks    Comment: occ  . Drug use: No     Family Hx: The patient's family history includes Aortic  aneurysm in her maternal grandfather; CVA in her maternal grandmother; Celiac disease in her brother; Heart attack in her mother.  ROS:   Please see the history of present illness.    Hearing loss. All other systems reviewed and are negative.   Prior CV studies:   The following studies were reviewed today:  PYP cardiac scan 04/22/2018: IMPRESSION: Visual and quantitative assessment (grade 1, H/CL equal 1.1) are NOT suggestive of transthyretin amyloidosis.  Echocardiogram 03/08/2018: Study Conclusions  - Left ventricle: The cavity size was normal. Wall thickness was   increased in a pattern of mild LVH. Systolic function was   vigorous. The estimated ejection fraction was in the range of 65%   to 70%. Wall motion was normal; there were no regional wall   motion abnormalities. The study is not technically sufficient to   allow evaluation of LV diastolic function. - Aortic valve: Moderately calcified annulus. Moderately thickened   leaflets. There was mild stenosis. Mean gradient (S): 13 mm Hg.   Valve area (VTI): 1.61 cm^2. Valve area (Vmax): 1.74 cm^2. Valve   area (Vmean): 1.74 cm^2. - Mitral valve: There was mild regurgitation. - Left atrium: The atrium was severely dilated. - Right ventricle: The cavity size was mildly dilated. - Right atrium: The atrium was severely dilated. - Technically difficult study.  Labs/Other Tests and Data Reviewed:    EKG:  An ECG dated 12/03/2017 was personally reviewed today and demonstrated:  Rate controlled atrial fibrillation.  Recent Labs: 03/08/2018: Hemoglobin 11.2; Platelets 229 03/26/2018: BUN 17; Creatinine, Ser 1.38; Potassium 4.4; Sodium 141   Recent Lipid Panel No results found for: CHOL, TRIG, HDL, CHOLHDL, LDLCALC, LDLDIRECT  Wt Readings from Last 3 Encounters:  06/03/18 206 lb (93.4 kg)  04/15/18 220 lb (99.8 kg)  03/26/18 221 lb 12.8 oz (100.6 kg)     Objective:    Vital Signs:  BP 136/83   Pulse 80   Ht _0  (1.727  m)   Wt 206 lb (93.4 kg)   BMI 31.32 kg/m    General: Patient appeared comfortable at rest sitting in a chair at her home.  She also got up and walked around to answer the door on one occasion. HEENT: Conjunctiva and lids normal.  She is wearing oxygen via nasal cannula. Skin: Pale but normal appearing turgor. Lungs: Patient able to speak in full sentences.  No audible wheezing. Neuropsychiatric: Affect is normal.  Gaze is conjugate.  She moves all extremities and was observed ambulating in her house.  ASSESSMENT & PLAN:    1.  Permanent atrial fibrillation.  Continue to focus on heart rate control with Cardizem CD and stroke prophylaxis with Eliquis.  She has had no bleeding problems.  I reviewed her lab work from  January-February.  2.  COPD with chronic hypoxic respiratory failure.  Recent PFTs are consistent with this.  Pulmonary rehabilitation recommended, but currently on hold in light of the COVID-19 pandemic.  3.  Chronic diastolic heart failure.  She is not on a diuretic at this time, actually her weight is down overall.  Recent PYP cardiac scan is not suggestive of TTR amyloidosis.  4.  Pulmonary hypertension, likely group III.  She continues to follow with Dr. Haroldine Laws.  COVID-19 Education: The signs and symptoms of COVID-19 were discussed with the patient and how to seek care for testing (follow up with PCP or arrange E-visit).  The importance of social distancing was discussed today.  Time:   Today, I have spent 15 minutes with the patient with telehealth technology discussing the above problems.     Medication Adjustments/Labs and Tests Ordered: Current medicines are reviewed at length with the patient today.  Concerns regarding medicines are outlined above.   Tests Ordered: No orders of the defined types were placed in this encounter.   Medication Changes: No orders of the defined types were placed in this encounter.   Disposition:  Follow up 3 months in [place]  office.  Signed, Rozann Lesches, MD  06/03/2018 11:31 AM    [place]

## 2018-06-02 NOTE — Telephone Encounter (Signed)
Pt verbalized consent to telehealth appt with Dr Domenic Polite 06/03/18. Pt meds/allergies/pharmacy reviewed. Pt will have vitals available.

## 2018-06-03 ENCOUNTER — Telehealth (INDEPENDENT_AMBULATORY_CARE_PROVIDER_SITE_OTHER): Payer: PPO | Admitting: Cardiology

## 2018-06-03 ENCOUNTER — Encounter: Payer: Self-pay | Admitting: Cardiology

## 2018-06-03 VITALS — BP 136/83 | HR 80 | Ht 68.0 in | Wt 206.0 lb

## 2018-06-03 DIAGNOSIS — Z7189 Other specified counseling: Secondary | ICD-10-CM

## 2018-06-03 DIAGNOSIS — J9611 Chronic respiratory failure with hypoxia: Secondary | ICD-10-CM | POA: Diagnosis not present

## 2018-06-03 DIAGNOSIS — I4821 Permanent atrial fibrillation: Secondary | ICD-10-CM | POA: Diagnosis not present

## 2018-06-03 DIAGNOSIS — I340 Nonrheumatic mitral (valve) insufficiency: Secondary | ICD-10-CM

## 2018-06-03 DIAGNOSIS — I4819 Other persistent atrial fibrillation: Secondary | ICD-10-CM

## 2018-06-03 DIAGNOSIS — I5032 Chronic diastolic (congestive) heart failure: Secondary | ICD-10-CM | POA: Diagnosis not present

## 2018-06-03 DIAGNOSIS — I272 Pulmonary hypertension, unspecified: Secondary | ICD-10-CM | POA: Diagnosis not present

## 2018-06-03 NOTE — Patient Instructions (Addendum)
Medication Instructions:   Your physician recommends that you continue on your current medications as directed. Please refer to the Current Medication list given to you today.  Labwork:  NONE  Testing/Procedures:  NONE  Follow-Up:  Your physician recommends that you schedule a follow-up appointment in: 3 months in the office with Dr. Domenic Polite.   Any Other Special Instructions Will Be Listed Below (If Applicable).  If you need a refill on your cardiac medications before your next appointment, please call your pharmacy.

## 2018-06-12 ENCOUNTER — Other Ambulatory Visit (HOSPITAL_COMMUNITY): Payer: Self-pay | Admitting: Internal Medicine

## 2018-06-21 DIAGNOSIS — Z6830 Body mass index (BMI) 30.0-30.9, adult: Secondary | ICD-10-CM | POA: Diagnosis not present

## 2018-06-21 DIAGNOSIS — E1122 Type 2 diabetes mellitus with diabetic chronic kidney disease: Secondary | ICD-10-CM | POA: Diagnosis not present

## 2018-06-21 DIAGNOSIS — I10 Essential (primary) hypertension: Secondary | ICD-10-CM | POA: Diagnosis not present

## 2018-06-21 DIAGNOSIS — Z299 Encounter for prophylactic measures, unspecified: Secondary | ICD-10-CM | POA: Diagnosis not present

## 2018-06-21 DIAGNOSIS — I272 Pulmonary hypertension, unspecified: Secondary | ICD-10-CM | POA: Diagnosis not present

## 2018-06-21 DIAGNOSIS — E1142 Type 2 diabetes mellitus with diabetic polyneuropathy: Secondary | ICD-10-CM | POA: Diagnosis not present

## 2018-07-01 DIAGNOSIS — R339 Retention of urine, unspecified: Secondary | ICD-10-CM | POA: Diagnosis not present

## 2018-07-02 DIAGNOSIS — I445 Left posterior fascicular block: Secondary | ICD-10-CM | POA: Diagnosis not present

## 2018-07-08 ENCOUNTER — Other Ambulatory Visit: Payer: Self-pay | Admitting: Cardiology

## 2018-07-12 ENCOUNTER — Encounter (HOSPITAL_COMMUNITY): Payer: Self-pay | Admitting: Emergency Medicine

## 2018-07-12 ENCOUNTER — Emergency Department (HOSPITAL_COMMUNITY)
Admission: EM | Admit: 2018-07-12 | Discharge: 2018-07-12 | Disposition: A | Discharge status: Home / Self Care | Payer: PPO | Attending: Emergency Medicine | Admitting: Emergency Medicine

## 2018-07-12 ENCOUNTER — Other Ambulatory Visit: Payer: Self-pay

## 2018-07-12 ENCOUNTER — Emergency Department (HOSPITAL_COMMUNITY): Payer: PPO

## 2018-07-12 DIAGNOSIS — M79672 Pain in left foot: Secondary | ICD-10-CM | POA: Diagnosis not present

## 2018-07-12 DIAGNOSIS — Y998 Other external cause status: Secondary | ICD-10-CM | POA: Diagnosis not present

## 2018-07-12 DIAGNOSIS — W208XXA Other cause of strike by thrown, projected or falling object, initial encounter: Secondary | ICD-10-CM | POA: Diagnosis not present

## 2018-07-12 DIAGNOSIS — E1140 Type 2 diabetes mellitus with diabetic neuropathy, unspecified: Secondary | ICD-10-CM | POA: Insufficient documentation

## 2018-07-12 DIAGNOSIS — Y929 Unspecified place or not applicable: Secondary | ICD-10-CM | POA: Diagnosis not present

## 2018-07-12 DIAGNOSIS — Z7984 Long term (current) use of oral hypoglycemic drugs: Secondary | ICD-10-CM | POA: Diagnosis not present

## 2018-07-12 DIAGNOSIS — Z79899 Other long term (current) drug therapy: Secondary | ICD-10-CM | POA: Insufficient documentation

## 2018-07-12 DIAGNOSIS — J449 Chronic obstructive pulmonary disease, unspecified: Secondary | ICD-10-CM | POA: Insufficient documentation

## 2018-07-12 DIAGNOSIS — S9032XA Contusion of left foot, initial encounter: Secondary | ICD-10-CM | POA: Diagnosis not present

## 2018-07-12 DIAGNOSIS — N184 Chronic kidney disease, stage 4 (severe): Secondary | ICD-10-CM | POA: Diagnosis not present

## 2018-07-12 DIAGNOSIS — Z299 Encounter for prophylactic measures, unspecified: Secondary | ICD-10-CM | POA: Diagnosis not present

## 2018-07-12 DIAGNOSIS — Y9389 Activity, other specified: Secondary | ICD-10-CM | POA: Insufficient documentation

## 2018-07-12 DIAGNOSIS — Z87891 Personal history of nicotine dependence: Secondary | ICD-10-CM | POA: Insufficient documentation

## 2018-07-12 DIAGNOSIS — Z853 Personal history of malignant neoplasm of breast: Secondary | ICD-10-CM | POA: Diagnosis not present

## 2018-07-12 DIAGNOSIS — Z6831 Body mass index (BMI) 31.0-31.9, adult: Secondary | ICD-10-CM | POA: Diagnosis not present

## 2018-07-12 DIAGNOSIS — I10 Essential (primary) hypertension: Secondary | ICD-10-CM | POA: Diagnosis not present

## 2018-07-12 DIAGNOSIS — S99922A Unspecified injury of left foot, initial encounter: Secondary | ICD-10-CM | POA: Diagnosis present

## 2018-07-12 DIAGNOSIS — Z7901 Long term (current) use of anticoagulants: Secondary | ICD-10-CM | POA: Insufficient documentation

## 2018-07-12 DIAGNOSIS — M7989 Other specified soft tissue disorders: Secondary | ICD-10-CM | POA: Diagnosis not present

## 2018-07-12 HISTORY — DX: Deficiency of other specified B group vitamins: E53.8

## 2018-07-12 HISTORY — DX: Polyneuropathy, unspecified: G62.9

## 2018-07-12 NOTE — ED Notes (Signed)
Patient's family member, granddaughter, in parking lot. Phone number [phone] Kingsley Spittle

## 2018-07-12 NOTE — ED Provider Notes (Signed)
[facility] EMERGENCY DEPARTMENT Provider Note   CSN: [id number] Arrival date & time: 07/12/18  1258    History   Chief Complaint Chief Complaint  Patient presents with  . Foot Pain    HPI Wanda [name] is a 78 y.o. female history of pulmonary hypertension on 4 L nasal cannula, proximal A. fib on on Eliquis, COPD presents emergency department today with chief complaint of left foot pain x5 days.  Patient states her empty oxygen tank fell over landing on the top of her left foot.  She has pain localized to the site of the injury that she describes as throbbing.  She rates the pain 4 out of 10 in severity.  The pain is worse when walking, better at rest.  She has not taken any pain medication prior to arrival.  She has been icing the foot daily with bags of frozen vegetables.  She states the ice has helped with the swelling.  She is able to bear weight. Denies numbness, weakness, open wound or laceration.    Past Medical History:  Diagnosis Date  . B12 deficiency   . Celiac disease   . COPD (chronic obstructive pulmonary disease) ([place])   . Essential hypertension   . Goiter   . History of breast cancer    Status post mastectomy  . History of cardiac catheterization    No significant CAD 2006  . Mixed hyperlipidemia   . Neuropathy   . Paroxysmal atrial fibrillation (HCC)    Coumadin therapy  . PUD (peptic ulcer disease)   . Pulmonary hypertension ([place])   . Type 2 diabetes mellitus [facility])     Patient Active Problem List   Diagnosis Date Noted  . Atrial fibrillation ([place])   . Pulmonary hypertension ([place])   . Long term (current) use of anticoagulants 05/03/2010  . HYPERLIPIDEMIA-MIXED 01/26/2009  . Essential hypertension, benign 01/26/2009  . PREMATURE VENTRICULAR CONTRACTIONS 01/26/2009    Past Surgical History:  Procedure Laterality Date  . DILATION AND CURETTAGE OF UTERUS    . HYSTEROSCOPY W/ ENDOMETRIAL ABLATION     ThermaChoice  . MASTECTOMY       OB History    No obstetric history on file.      Home Medications    Prior to Admission medications   Medication Sig Start Date End Date Taking? Authorizing Provider  ALPRAZolam Duanne Moron) 1 MG tablet Take 1 mg by mouth at bedtime.    Yes [provider]  apixaban (ELIQUIS) 5 MG TABS tablet Take 1 tablet (5 mg total) by mouth 2 (two) times daily. 12/03/17  Yes Satira Sark, MD  atorvastatin (LIPITOR) 10 MG tablet Take 10 mg by mouth every evening.    Yes [provider]  carvedilol (COREG) 25 MG tablet Take 25 mg by mouth 2 (two) times daily with a meal.  02/26/18  Yes [provider]  diltiazem (CARDIZEM CD) 240 MG 24 hr capsule TAKE ONE CAPSULE BY MOUTH DAILY 07/08/18  Yes Satira Sark, MD  gabapentin (NEURONTIN) 100 MG capsule Take 100 mg by mouth 2 (two) times daily.   Yes [provider]  glimepiride (AMARYL) 2 MG tablet Take 2 mg by mouth every morning. 02/11/18  Yes [provider]  hydrALAZINE (APRESOLINE) 25 MG tablet TAKE TWO (2) TABLETS BY MOUTH THREE TIMES DAILY. 06/14/18  Yes Bensimhon, Shaune Pascal, MD  lisinopril (PRINIVIL,ZESTRIL) 10 MG tablet Take 10 mg by mouth 2 (two) times daily.    Yes [provider]  trimethoprim (TRIMPEX) 100 MG tablet Take 100 mg by mouth at bedtime.   Yes [provider]  vitamin B-12 (CYANOCOBALAMIN) 1000 MCG tablet Take 1,000 mcg by mouth daily.   Yes [provider]    Family History Family History  Problem Relation Age of Onset  . Heart attack Mother   . Celiac disease Brother   . CVA Maternal Grandmother   . Aortic aneurysm Maternal Grandfather     Social History Social History   Tobacco Use  . Smoking status: Former Smoker    Types: Cigarettes    Last attempt to quit: 02/11/1995    Years since quitting: 23.4  . Smokeless tobacco: Never Used  Substance Use Topics  . Alcohol use: Yes    Alcohol/week: 0.0 standard drinks    Comment: occ  . Drug use: No     Allergies    Gluten; Hydromorphone hcl; and Prednisone   Review of Systems Review of Systems  Constitutional: Negative for chills and fever.  HENT: Negative for congestion, ear discharge, ear pain, sinus pressure, sinus pain and sore throat.   Eyes: Negative for pain and redness.  Respiratory: Negative for cough and shortness of breath.   Cardiovascular: Negative for chest pain.  Gastrointestinal: Negative for abdominal pain, constipation, diarrhea, nausea and vomiting.  Genitourinary: Negative for dysuria and hematuria.  Musculoskeletal: Positive for arthralgias. Negative for back pain and neck pain.  Skin: Negative for wound.  Neurological: Negative for weakness, numbness and headaches.     Physical Exam Updated Vital Signs BP 135/87 (BP Location: Right Arm)   Pulse 80   Temp 98.6 F (37 C) (Oral)   Resp 20   Ht _0  (1.727 m)   Wt 94.3 kg   SpO2 (!) 89% Comment: Per patient this is her normal O2 stat  BMI 31.63 kg/m   Physical Exam Vitals signs and nursing note reviewed.  Constitutional:      General: She is not in acute distress.    Appearance: She is not ill-appearing.  HENT:     Head: Normocephalic and atraumatic.     Right Ear: External ear normal.     Left Ear: External ear normal.     Nose: Nose normal.     Mouth/Throat:     Mouth: Mucous membranes are moist.     Pharynx: Oropharynx is clear.  Eyes:     General: No scleral icterus.       Right eye: No discharge.        Left eye: No discharge.     Conjunctiva/sclera: Conjunctivae normal.  Neck:     Musculoskeletal: Normal range of motion.     Vascular: No JVD.  Cardiovascular:     Rate and Rhythm: Normal rate and regular rhythm.     Pulses: Normal pulses.          Radial pulses are 2+ on the right side and 2+ on the left side.       Dorsalis pedis pulses are 2+ on the right side and 2+ on the left side.     Heart sounds: Normal heart sounds.  Pulmonary:     Comments: Lungs clear to auscultation in all fields.  Symmetric chest rise. No wheezing, rales, or rhonchi.  Patient wearing 4 L nasal cannula with SpO2 of 97% during exam. Abdominal:     Comments: Abdomen is soft, non-distended, and non-tender in all quadrants. No rigidity, no guarding. No peritoneal signs.  Musculoskeletal: Normal range of  motion.     Right lower leg: No edema.     Left lower leg: No edema.     Comments: Patient with full range of motion of left knee and ankle. She is able to wiggle toes on left foot without difficulty.   No tenderness over fifth metatarsal or navicular of left foot. Full ROM with plantarflexion and dorsiflexion, inversion and eversion. Lower extremity compartments are soft. Patient ambulates with steady gait.   Skin:    Capillary Refill: Capillary refill takes less than 2 seconds.     Findings: Bruising present.     Comments: Swelling and bruising to dorsal aspect of left foot from midfoot to base of toes.  No open wound or skin tear.  Neurological:     Mental Status: She is oriented to person, place, and time.     GCS: GCS eye subscore is 4. GCS verbal subscore is 5. GCS motor subscore is 6.     Comments: Fluent speech, no facial droop.  Psychiatric:        Behavior: Behavior normal.      ED Treatments / Results  Labs (all labs ordered are listed, but only abnormal results are displayed) Labs Reviewed - No data to display  EKG None  Radiology Dg Foot Complete Left  Result Date: 07/12/2018 CLINICAL DATA:  Dropped oxygen tank on foot last Thursday. Significant bruising and swelling. EXAM: LEFT FOOT - COMPLETE 3+ VIEW COMPARISON:  None. FINDINGS: The joint spaces are fairly well maintained. Mild degenerative changes most notable at the first MTP joint. No acute fractures identified. Vascular calcifications are noted. Small calcaneal heel spur. IMPRESSION: No acute fracture. Electronically Signed   By: Marijo Sanes M.D.   On: 07/12/2018 14:08    Procedures Procedures (including critical care time)   Medications Ordered in ED Medications - No data to display   Initial Impression / Assessment and Plan / ED Course  I have reviewed the triage vital signs and the nursing notes.  Pertinent labs & imaging results that were available during my care of the patient were reviewed by me and considered in my medical decision making (see chart for details).  Patient presents to the ED with complaints of pain to the left foot s/p injury empty O2 tank fell on it. Exam without obvious deformity or open wounds. ROM intact. Tender to palpation. NVI distally. Xray of left foot viewed by me is negative for fracture/dislocation. Post op shoe provided for comfort. She declined pain medication while here in the emergency department. I ambulated patient and she did so without difficulty. PRICE and tylenol recommended. Pt has documented SpO2 of 89% 4L [place] on arrival, she reports is her normal range is 88-94% I personally checked with pulse ox and SpO2 is 93%. She is in no acute distress.  I discussed results, treatment plan, need for follow-up with pcp, and return precautions with the patient. Provided opportunity for questions, patient confirmed understanding and are in agreement with plan.    Final Clinical Impressions(s) / ED Diagnoses   Final diagnoses:  Contusion of left foot, initial encounter    ED Discharge Orders    None       Cherre Robins, PA-C 07/13/18 1700    Fredia Sorrow, MD 07/26/18 [phone]

## 2018-07-12 NOTE — Discharge Instructions (Signed)
You were seen today for pain in your left foot. Please read and follow all provided instructions.   Your xray is negative for fractures.  1. Medications: tylenol for pain control, usual home medications.  2. Treatment: rest, ice, elevate and use post op shoe, drink plenty of fluids, gentle stretching  3. Follow Up: Please followup with orthopedics as directed or your PCP in 1 week if no improvement for discussion of your diagnoses and further evaluation after today's visit; Please return to the ER for worsening symptoms or other concerns

## 2018-07-12 NOTE — ED Triage Notes (Addendum)
Patient c/o left foot pain after dropping oxygen tank on foot Thursday. Patient has significant bruising to all toes with some swelling noted. Patient does take Eliqus

## 2018-07-12 NOTE — ED Notes (Signed)
Pt returned from xray

## 2018-07-12 NOTE — ED Provider Notes (Signed)
Medical screening examination/treatment/procedure(s) were conducted as a shared visit with non-physician practitioner(s) and myself.  I personally evaluated the patient during the encounter.  None   Patient seen by me along with physician assistant.  Patient had her oxygen tank fall on her foot on Thursday.  A lot of bruising to the forefoot and at the base of the toes.  Patient is on the blood thinner Eliquis.  No other injuries.  X-ray here today without any acute findings  Results for orders placed or performed during the hospital encounter of 04/21/18  Pulmonary function test  Result Value Ref Range   FVC-Pre 2.88 L   FVC-%Pred-Pre 91 %   FVC-Post 3.09 L   FVC-%Pred-Post 97 %   FVC-%Change-Post 7 %   FEV1-Pre 1.58 L   FEV1-%Pred-Pre 66 %   FEV1-Post 1.71 L   FEV1-%Pred-Post 72 %   FEV1-%Change-Post 8 %   FEV6-Pre 2.57 L   FEV6-%Pred-Pre 85 %   FEV6-Post 2.79 L   FEV6-%Pred-Post 93 %   FEV6-%Change-Post 8 %   Pre FEV1/FVC ratio 55 %   FEV1FVC-%Pred-Pre 74 %   Post FEV1/FVC ratio 55 %   FEV1FVC-%Change-Post 0 %   Pre FEV6/FVC Ratio 89 %   FEV6FVC-%Pred-Pre 93 %   Post FEV6/FVC ratio 90 %   FEV6FVC-%Pred-Post 95 %   FEV6FVC-%Change-Post 1 %   FEF 25-75 Pre 0.53 L/sec   FEF2575-%Pred-Pre 31 %   FEF 25-75 Post 0.73 L/sec   FEF2575-%Pred-Post 43 %   FEF2575-%Change-Post 37 %   DLCO unc 10.40 ml/min/mmHg   DLCO unc % pred 48 %   DL/VA 2.34 ml/min/mmHg/L   DL/VA % pred 58 %   Dg Foot Complete Left  Result Date: 07/12/2018 CLINICAL DATA:  Dropped oxygen tank on foot last Thursday. Significant bruising and swelling. EXAM: LEFT FOOT - COMPLETE 3+ VIEW COMPARISON:  None. FINDINGS: The joint spaces are fairly well maintained. Mild degenerative changes most notable at the first MTP joint. No acute fractures identified. Vascular calcifications are noted. Small calcaneal heel spur. IMPRESSION: No acute fracture. Electronically Signed   By: Marijo Sanes M.D.   On: 07/12/2018 14:08    No acute injuries on the x-ray.  Patient will be treated with a postop shoe.  And follow-up.  No significant neurovascular deficits to the foot.   Fredia Sorrow, MD 07/12/18 1450

## 2018-07-27 DIAGNOSIS — N184 Chronic kidney disease, stage 4 (severe): Secondary | ICD-10-CM | POA: Diagnosis not present

## 2018-07-27 DIAGNOSIS — Z6832 Body mass index (BMI) 32.0-32.9, adult: Secondary | ICD-10-CM | POA: Diagnosis not present

## 2018-07-27 DIAGNOSIS — J449 Chronic obstructive pulmonary disease, unspecified: Secondary | ICD-10-CM | POA: Diagnosis not present

## 2018-07-27 DIAGNOSIS — M67439 Ganglion, unspecified wrist: Secondary | ICD-10-CM | POA: Diagnosis not present

## 2018-07-27 DIAGNOSIS — Z299 Encounter for prophylactic measures, unspecified: Secondary | ICD-10-CM | POA: Diagnosis not present

## 2018-07-27 DIAGNOSIS — I270 Primary pulmonary hypertension: Secondary | ICD-10-CM | POA: Diagnosis not present

## 2018-08-02 DIAGNOSIS — I445 Left posterior fascicular block: Secondary | ICD-10-CM | POA: Diagnosis not present

## 2018-08-02 DIAGNOSIS — R339 Retention of urine, unspecified: Secondary | ICD-10-CM | POA: Diagnosis not present

## 2018-08-24 DIAGNOSIS — R339 Retention of urine, unspecified: Secondary | ICD-10-CM | POA: Diagnosis not present

## 2018-08-30 ENCOUNTER — Telehealth: Payer: Self-pay | Admitting: Cardiology

## 2018-08-30 NOTE — Telephone Encounter (Signed)

## 2018-09-01 ENCOUNTER — Telehealth (HOSPITAL_COMMUNITY): Payer: Self-pay | Admitting: Vascular Surgery

## 2018-09-01 ENCOUNTER — Telehealth: Payer: Self-pay | Admitting: Cardiology

## 2018-09-01 DIAGNOSIS — R0902 Hypoxemia: Secondary | ICD-10-CM | POA: Diagnosis not present

## 2018-09-01 DIAGNOSIS — I445 Left posterior fascicular block: Secondary | ICD-10-CM | POA: Diagnosis not present

## 2018-09-01 NOTE — Telephone Encounter (Signed)

## 2018-09-01 NOTE — Telephone Encounter (Signed)
Left pt message giving 6 month f/u 9/14 @ 9:20am , asked pt to call back to confirm appt

## 2018-09-02 ENCOUNTER — Other Ambulatory Visit: Payer: Self-pay

## 2018-09-02 ENCOUNTER — Encounter: Payer: Self-pay | Admitting: Cardiology

## 2018-09-02 ENCOUNTER — Ambulatory Visit (INDEPENDENT_AMBULATORY_CARE_PROVIDER_SITE_OTHER): Payer: PPO | Admitting: Cardiology

## 2018-09-02 VITALS — BP 138/80 | HR 83 | Temp 98.0°F | Ht 68.0 in | Wt 217.0 lb

## 2018-09-02 DIAGNOSIS — I4819 Other persistent atrial fibrillation: Secondary | ICD-10-CM

## 2018-09-02 DIAGNOSIS — I10 Essential (primary) hypertension: Secondary | ICD-10-CM

## 2018-09-02 DIAGNOSIS — I272 Pulmonary hypertension, unspecified: Secondary | ICD-10-CM | POA: Diagnosis not present

## 2018-09-02 DIAGNOSIS — Z79899 Other long term (current) drug therapy: Secondary | ICD-10-CM | POA: Diagnosis not present

## 2018-09-02 NOTE — Patient Instructions (Addendum)
Medication Instructions:   Your physician recommends that you continue on your current medications as directed. Please refer to the Current Medication list given to you today.  Labwork:  Your physician recommends that you return for non-fasting lab work in: 4 months just before your next visit to check your BMET & CBC. Your lab orders have been given to you today.  Testing/Procedures:  NONE  Follow-Up:  Your physician recommends that you schedule a follow-up appointment in: 4 months.  Any Other Special Instructions Will Be Listed Below (If Applicable).  If you need a refill on your cardiac medications before your next appointment, please call your pharmacy.

## 2018-09-02 NOTE — Progress Notes (Signed)
Cardiology Office Note  Date: 09/02/2018   ID: Wanda [name], DOB [birth year], MRN [id number]  PCP:  Glenda Chroman, MD  Cardiologist:  Rozann Lesches, MD Electrophysiologist:  None   Chief Complaint  Patient presents with  . Atrial Fibrillation    History of Present Illness: Wanda [name] is a 78 y.o. female last assessed by telehealth encounter in April.  She presents for a routine visit today.  She does not report any palpitations and atrial fibrillation.  She remains on supplemental oxygen with NYHA class III dyspnea, anticipates follow-up with Dr. Haroldine Laws for pulmonary hypertension in the next month or so.  She feels like her breathlessness has gotten somewhat worse.  I reviewed her medications which are outlined below.  She reports no spontaneous bleeding problems on Eliquis.  Heart rate control is good today on combination of Coreg and Cardizem CD.  Her blood pressure is also better controlled.  I personally reviewed her ECG which shows rate controlled atrial fibrillation at 79 bpm with low voltage.  Past Medical History:  Diagnosis Date  . B12 deficiency   . Celiac disease   . COPD (chronic obstructive pulmonary disease) ([place])   . Essential hypertension   . Goiter   . History of breast cancer    Status post mastectomy  . History of cardiac catheterization    No significant CAD 2006  . Mixed hyperlipidemia   . Neuropathy   . Paroxysmal atrial fibrillation (HCC)    Coumadin therapy  . PUD (peptic ulcer disease)   . Pulmonary hypertension ([place])   . Type 2 diabetes mellitus ([place])     Past Surgical History:  Procedure Laterality Date  . DILATION AND CURETTAGE OF UTERUS    . HYSTEROSCOPY W/ ENDOMETRIAL ABLATION     ThermaChoice  . MASTECTOMY      Current Outpatient Medications  Medication Sig Dispense Refill  . ALPRAZolam (XANAX) 1 MG tablet Take 1 mg by mouth at bedtime.     Marland Kitchen apixaban (ELIQUIS) 5 MG TABS tablet Take 1 tablet (5 mg total) by mouth 2  (two) times daily. 60 tablet 11  . atorvastatin (LIPITOR) 10 MG tablet Take 10 mg by mouth every evening.     . carvedilol (COREG) 25 MG tablet Take 25 mg by mouth 2 (two) times daily with a meal.     . diltiazem (CARDIZEM CD) 240 MG 24 hr capsule TAKE ONE CAPSULE BY MOUTH DAILY 90 capsule 1  . gabapentin (NEURONTIN) 100 MG capsule Take 100 mg by mouth 2 (two) times daily.    Marland Kitchen glimepiride (AMARYL) 2 MG tablet Take 2 mg by mouth every morning.    . hydrALAZINE (APRESOLINE) 25 MG tablet TAKE TWO (2) TABLETS BY MOUTH THREE TIMES DAILY. 180 tablet 3  . lisinopril (PRINIVIL,ZESTRIL) 10 MG tablet Take 10 mg by mouth 2 (two) times daily.     Marland Kitchen trimethoprim (TRIMPEX) 100 MG tablet Take 100 mg by mouth at bedtime.    . vitamin B-12 (CYANOCOBALAMIN) 1000 MCG tablet Take 1,000 mcg by mouth daily.     No current facility-administered medications for this visit.    Allergies:  Gluten, Hydromorphone hcl, and Prednisone   Social History: The patient  reports that she quit smoking about 23 years ago. Her smoking use included cigarettes. She has never used smokeless tobacco. She reports current alcohol use. She reports that she does not use drugs.   ROS:  Please see the history of present illness. Otherwise,  complete review of systems is positive for none.  All other systems are reviewed and negative.   Physical Exam: VS:  BP 138/80   Pulse 83   Temp 98 F (36.7 C)   Ht 5' 8" (1.727 m)   Wt 217 lb (98.4 kg)   SpO2 96%   BMI 32.99 kg/m , BMI Body mass index is 32.99 kg/m.  Wt Readings from Last 3 Encounters:  09/02/18 217 lb (98.4 kg)  07/12/18 208 lb (94.3 kg)  06/03/18 206 lb (93.4 kg)    General: Elderly woman, wearing oxygen via nasal cannula. HEENT: Conjunctiva and lids normal, wearing a mask. Neck: Supple, no elevated JVP or carotid bruits, no thyromegaly. Lungs: Clear to auscultation, nonlabored breathing at rest. Cardiac: Irregularly irregular, no S3. Abdomen: Soft, nontender, bowel  sounds present. Extremities: Trace ankle edema, distal pulses 2+. Skin:  Pale. Warm and dry. Musculoskeletal: No kyphosis. Neuropsychiatric: Alert and oriented x3, affect grossly appropriate.  ECG:  An ECG dated 12/03/2017 was personally reviewed today and demonstrated:  Rate controlled atrial fibrillation.  Recent Labwork: 03/08/2018: Hemoglobin 11.2; Platelets 229 03/26/2018: BUN 17; Creatinine, Ser 1.38; Potassium 4.4; Sodium 141   Other Studies Reviewed Today:  P[facility] cardiac scan 04/22/2018: IMPRESSION: Visual and quantitative assessment (grade 1, H/CL equal 1.1) are NOT suggestive of transthyretin amyloidosis.  Echocardiogram 03/08/2018: Study Conclusions  - Left ventricle: The cavity size was normal. Wall thickness was increased in a pattern of mild LVH. Systolic function was vigorous. The estimated ejection fraction was in the range of 65% to 70%. Wall motion was normal; there were no regional wall motion abnormalities. The study is not technically sufficient to allow evaluation of LV diastolic function. - Aortic valve: Moderately calcified annulus. Moderately thickened leaflets. There was mild stenosis. Mean gradient (S): 13 mm Hg. Valve area (VTI): 1.61 cm^2. Valve area (Vmax): 1.74 cm^2. Valve area (Vmean): 1.74 cm^2. - Mitral valve: There was mild regurgitation. - Left atrium: The atrium was severely dilated. - Right ventricle: The cavity size was mildly dilated. - Right atrium: The atrium was severely dilated. - Technically difficult study.  Assessment and Plan:  1.  Permanent atrial fibrillation.  Heart rate is well controlled today and she reports no palpitations on combination of Coreg and Cardizem CD.  Plan to continue Eliquis for stroke prophylaxis.  Follow-up CBC and BMET for next visit.  2.  COPD with chronic epoxy respiratory failure.  She remains on supplemental oxygen.  3.  Pulmonary hypertension, likely group III.  Follow-up visit pending  with Dr. Haroldine Laws.  4.  Chronic diastolic heart failure.  She is not on a standing diuretic at this point.  Weight fluctuates up and down to some degree, may need to use Lasix on an as-needed basis.  Medication Adjustments/Labs and Tests Ordered: Current medicines are reviewed at length with the patient today.  Concerns regarding medicines are outlined above.   Tests Ordered: Orders Placed This Encounter  Procedures  . Basic metabolic panel  . CBC  . EKG 12-Lead    Medication Changes: No orders of the defined types were placed in this encounter.   Disposition:  Follow up 4 months in the [place] office.  Signed, Satira Sark, MD, [facility] 09/02/2018 11:04 AM    [facility] [place], [place], [place] [address] Phone: [phone]; Fax: [phone]

## 2018-09-03 DIAGNOSIS — R5383 Other fatigue: Secondary | ICD-10-CM | POA: Diagnosis not present

## 2018-09-03 DIAGNOSIS — Z7189 Other specified counseling: Secondary | ICD-10-CM | POA: Diagnosis not present

## 2018-09-03 DIAGNOSIS — Z1211 Encounter for screening for malignant neoplasm of colon: Secondary | ICD-10-CM | POA: Diagnosis not present

## 2018-09-03 DIAGNOSIS — I270 Primary pulmonary hypertension: Secondary | ICD-10-CM | POA: Diagnosis not present

## 2018-09-03 DIAGNOSIS — Z1339 Encounter for screening examination for other mental health and behavioral disorders: Secondary | ICD-10-CM | POA: Diagnosis not present

## 2018-09-03 DIAGNOSIS — E7800 Pure hypercholesterolemia, unspecified: Secondary | ICD-10-CM | POA: Diagnosis not present

## 2018-09-03 DIAGNOSIS — E0590 Thyrotoxicosis, unspecified without thyrotoxic crisis or storm: Secondary | ICD-10-CM | POA: Diagnosis not present

## 2018-09-03 DIAGNOSIS — Z1331 Encounter for screening for depression: Secondary | ICD-10-CM | POA: Diagnosis not present

## 2018-09-03 DIAGNOSIS — Z0000 Encounter for general adult medical examination without abnormal findings: Secondary | ICD-10-CM | POA: Diagnosis not present

## 2018-09-03 DIAGNOSIS — Z299 Encounter for prophylactic measures, unspecified: Secondary | ICD-10-CM | POA: Diagnosis not present

## 2018-09-03 DIAGNOSIS — Z6831 Body mass index (BMI) 31.0-31.9, adult: Secondary | ICD-10-CM | POA: Diagnosis not present

## 2018-09-04 ENCOUNTER — Encounter (HOSPITAL_COMMUNITY): Payer: Self-pay

## 2018-09-08 ENCOUNTER — Other Ambulatory Visit (HOSPITAL_BASED_OUTPATIENT_CLINIC_OR_DEPARTMENT_OTHER): Payer: Self-pay

## 2018-09-15 ENCOUNTER — Other Ambulatory Visit: Payer: Self-pay

## 2018-09-15 ENCOUNTER — Ambulatory Visit (HOSPITAL_COMMUNITY)
Admission: RE | Admit: 2018-09-15 | Discharge: 2018-09-15 | Disposition: A | Discharge status: Home / Self Care | Payer: PPO | Source: Ambulatory Visit | Attending: Internal Medicine | Admitting: Internal Medicine

## 2018-09-15 ENCOUNTER — Telehealth (HOSPITAL_COMMUNITY): Payer: Self-pay | Admitting: Cardiology

## 2018-09-15 ENCOUNTER — Encounter (HOSPITAL_COMMUNITY): Payer: Self-pay | Admitting: Internal Medicine

## 2018-09-15 VITALS — BP 142/76 | HR 81 | Wt 217.2 lb

## 2018-09-15 DIAGNOSIS — G629 Polyneuropathy, unspecified: Secondary | ICD-10-CM | POA: Diagnosis not present

## 2018-09-15 DIAGNOSIS — Z7984 Long term (current) use of oral hypoglycemic drugs: Secondary | ICD-10-CM | POA: Diagnosis not present

## 2018-09-15 DIAGNOSIS — Z9981 Dependence on supplemental oxygen: Secondary | ICD-10-CM | POA: Insufficient documentation

## 2018-09-15 DIAGNOSIS — I110 Hypertensive heart disease with heart failure: Secondary | ICD-10-CM | POA: Insufficient documentation

## 2018-09-15 DIAGNOSIS — Z8711 Personal history of peptic ulcer disease: Secondary | ICD-10-CM | POA: Insufficient documentation

## 2018-09-15 DIAGNOSIS — I4821 Permanent atrial fibrillation: Secondary | ICD-10-CM | POA: Diagnosis not present

## 2018-09-15 DIAGNOSIS — Z8249 Family history of ischemic heart disease and other diseases of the circulatory system: Secondary | ICD-10-CM | POA: Diagnosis not present

## 2018-09-15 DIAGNOSIS — I272 Pulmonary hypertension, unspecified: Secondary | ICD-10-CM

## 2018-09-15 DIAGNOSIS — I5030 Unspecified diastolic (congestive) heart failure: Secondary | ICD-10-CM | POA: Insufficient documentation

## 2018-09-15 DIAGNOSIS — E119 Type 2 diabetes mellitus without complications: Secondary | ICD-10-CM | POA: Diagnosis not present

## 2018-09-15 DIAGNOSIS — Z87891 Personal history of nicotine dependence: Secondary | ICD-10-CM | POA: Insufficient documentation

## 2018-09-15 DIAGNOSIS — J449 Chronic obstructive pulmonary disease, unspecified: Secondary | ICD-10-CM | POA: Insufficient documentation

## 2018-09-15 DIAGNOSIS — Z853 Personal history of malignant neoplasm of breast: Secondary | ICD-10-CM | POA: Insufficient documentation

## 2018-09-15 DIAGNOSIS — R0683 Snoring: Secondary | ICD-10-CM | POA: Diagnosis not present

## 2018-09-15 DIAGNOSIS — Z79899 Other long term (current) drug therapy: Secondary | ICD-10-CM | POA: Insufficient documentation

## 2018-09-15 DIAGNOSIS — E782 Mixed hyperlipidemia: Secondary | ICD-10-CM | POA: Insufficient documentation

## 2018-09-15 DIAGNOSIS — K900 Celiac disease: Secondary | ICD-10-CM | POA: Diagnosis not present

## 2018-09-15 DIAGNOSIS — Z7901 Long term (current) use of anticoagulants: Secondary | ICD-10-CM | POA: Insufficient documentation

## 2018-09-15 DIAGNOSIS — E538 Deficiency of other specified B group vitamins: Secondary | ICD-10-CM | POA: Insufficient documentation

## 2018-09-15 DIAGNOSIS — Z8379 Family history of other diseases of the digestive system: Secondary | ICD-10-CM | POA: Insufficient documentation

## 2018-09-15 DIAGNOSIS — I5032 Chronic diastolic (congestive) heart failure: Secondary | ICD-10-CM | POA: Diagnosis not present

## 2018-09-15 NOTE — Addendum Note (Signed)
Encounter addended by: Shonna Chock, CMA on: 0/04/5463 3:40 PM  Actions taken: Clinical Note Signed

## 2018-09-15 NOTE — Patient Instructions (Signed)
No medication changes were made today. Continue current medications as prescribed.   Your physician recommends that you schedule a follow-up appointment in: 4 months  You have been referred to [facility]. They will contact you to schedule an appointment.   Your provider has recommended that you have a home sleep study.  [employer] is the company that provides these and will send the equipment right to your home with instructions on how to set it up.  Once you have completed the test you just dispose of the equipment, the information is automatically uploaded to [name].  IF you have any questions or issues with the equipment please call the company.  If your test was positive and you need a home CPAP machine you will be contacted by Dr Theodosia Blender office [facility]) to set this up.  At the [facility], you and your health needs are our priority. As part of our continuing mission to provide you with exceptional heart care, we have created designated Provider Care Teams. These Care Teams include your primary Cardiologist (physician) and Advanced Practice Providers (APPs- Physician Assistants and Nurse Practitioners) who all work together to provide you with the care you need, when you need it.   You may see any of the following providers on your designated Care Team at your next follow up: Marland Kitchen Dr Glori Bickers . Dr Loralie Champagne . Darrick Grinder, NP   Please be sure to bring in all your medications bottles to every appointment.

## 2018-09-15 NOTE — Telephone Encounter (Addendum)
open in error

## 2018-09-15 NOTE — Addendum Note (Signed)
Encounter addended by: Shonna Chock, CMA on: 0/05/457 3:21 PM  Actions taken: Visit diagnoses modified, Order list changed, Diagnosis association updated, Clinical Note Signed

## 2018-09-15 NOTE — Progress Notes (Signed)
Patient Name: Wanda [name]        DOB: 01/09/1941      Height: 54'8    Weight:216.7lbs  Office Name: [facility]          Referring Provider: Dr. Glori Bickers  Today's Date: 09/15/2018  Date:  09/15/2018 STOP BANG RISK ASSESSMENT S (snore) Have you been told that you snore?     YES   T (tired) Are you often tired, fatigued, or sleepy during the day?   YES  O (obstruction) Do you stop breathing, choke, or gasp during sleep? YES   P (pressure) Do you have or are you being treated for high blood pressure? YES   B (BMI) Is your body index greater than 35 kg/m? NO   A (age) Are you 101 years old or older? YES   N (neck) Do you have a neck circumference greater than 16 inches?   YES/NO   G (gender) Are you a female? NO   TOTAL STOP/BANG "YES" ANSWERS 5                                                                       For Office Use Only              Procedure Order Form    YES to 3+ Stop Bang questions OR two clinical symptoms - patient qualifies for WatchPAT (CPT 95800)     Submit: This Form + Patient Face Sheet + Clinical Note via CloudPAT or Fax: [phone]         Clinical Notes: Will consult Sleep Specialist and refer for management of therapy due to patient increased risk of Sleep Apnea. Ordering a sleep study due to the following two clinical symptoms: Excessive daytime sleepiness G47.10 / Gastroesophageal reflux K21.9 / Nocturia R35.1 / Morning Headaches G44.221 / Difficulty concentrating R41.840 / Memory problems or poor judgment G31.84 / Personality changes or irritability R45.4 / Loud snoring R06.83 / Depression F32.9 / Unrefreshed by sleep G47.8 / Impotence N52.9 / History of high blood pressure R03.0 / Insomnia G47.00    I understand that I am proceeding with a home sleep apnea test as ordered by my treating physician. I understand that untreated sleep apnea is a serious cardiovascular risk factor and it is my responsibility to perform the test and  seek management for sleep apnea. I will be contacted with the results and be managed for sleep apnea by a local sleep physician. I will be receiving equipment and further instructions from [facility]. I shall promptly ship back the equipment via the included mailing label. I understand my insurance will be billed for the test and as the patient I am responsible for any insurance related out-of-pocket costs incurred. I have been provided with written instructions and can call for additional video or telephonic instruction, with 24-hour availability of qualified personnel to answer any questions: Patient Help Desk [phone].  Patient Signature ______________________________________________________   Date______________________ Patient Telemedicine Verbal Consent

## 2018-09-15 NOTE — Progress Notes (Signed)
ADVANCED HF CONSULT NOTE  Referring: Dr. Woody Seller Cardiologist: Dr. Domenic Polite   Patient ID: Wanda [name], female   DOB: Mar 10, 1940, 78 y.o.   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 We last saw her in 2014. She is referred back by Dr. Woody Seller due to [facility] and respiratory failure.   She has a complicated PAH history.   Found to be hypoxemic in Dec 2010 started on O2. CT chest 12/10: No PE. Elevation of R hemidiaphragm. Areas of centrilobular emphysema. atelectasis in both bases. Large left lobe of thyroid with trachea deviation.  Cath 2006 no significant CAD  Echo 01/23/09: 55-60% mild LVH with pseudonormalizaiton. Mild to moderate MR. RV mild to moderately dilated. Mild RV dysfunction. Mild AS mean 10m HG.  RSVP 67. PFTs FEV1 1.23L (50%) FEV 1.81 (53%) DLCO 43% predicted.  Cath 1/11: RA 11, RV 84/6 with an EDP of 18, PA 83/28 with a mean of 49. PCWP 15  Fick cardiac output 7.0 L/min.  Cardiac index 3.0  PVR is 4.9 Woods units.  Started on Tyvaso (treprostinil) in March 2011. Repeat RHC 11/11 with marked improvement  of pressures. RA 4 RV 43/4 with EDP of 10.  PAP 42/15 with a mean of 25.  PCWP 9 Fick cardiac output 4.7.  Cardiac index 2.1.  PVR 3.4 Woods units.  Femoral artery saturation is 93% on 3 L of supplemental oxygen.  PA saturations were 53% and 57%.  Off Tyvaso August 2013. ECHO 12/26/2011 EF 55-60% Peak PA pressure 46  In 11/19 had episode of severe epistaxis. Coumadin stopped (was on for chronic AF). Eventually switched to Eliquis. No further bleeding.   PYP scan 3/20: H/CLL 1.1 Negative for TTR PFTs 3/20:   FEV1 1.58 (66%)  FVC 3.09 (97%)  FEV1/FVC (74%)  FEF 25-75 0.53L  DLCO 48% Echo 03/08/18: EF 65-70% mild AS. RVSP 45-50. 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 Walking all around the house but doesn't goes outside due to CGalax Wearing O2. Gets mildly SOB and low back pain with chores. No neuro symptoms. Minimal edema/ No orthopnea or PND.      Past Medical History:  Diagnosis Date  . B12 deficiency   . Celiac disease   . COPD [place] disease) [place]   . Essential hypertension   . Goiter   . History of breast cancer    Status post mastectomy  . History of cardiac catheterization    No significant CAD 2006  . Mixed hyperlipidemia   . Neuropathy   . Paroxysmal atrial fibrillation (HCC)    Coumadin therapy  . PUD (peptic ulcer disease)   . Pulmonary hypertension ([place]   . Type 2 diabetes mellitus ([place]     Current Outpatient Medications  Medication Sig Dispense Refill  . ALPRAZolam (XANAX) 1 MG tablet Take 1 mg by mouth at bedtime.     .Marland Kitchenapixaban (ELIQUIS) 5 MG TABS tablet Take 1 tablet (5 mg total) by mouth 2 (two) times daily. 60 tablet 11  . atorvastatin (LIPITOR) 10 MG tablet Take 10 mg by mouth every evening.     . carvedilol (COREG) 25 MG tablet Take 25 mg by mouth 2 (two) times daily with a meal.     . diltiazem (CARDIZEM  CD) 240 MG 24 hr capsule TAKE ONE CAPSULE BY MOUTH DAILY 90 capsule 1  . gabapentin (NEURONTIN) 100 MG capsule Take 100 mg by mouth 2 (two) times daily.    Marland Kitchen glimepiride (AMARYL) 2 MG tablet Take 2 mg by mouth every morning.    . hydrALAZINE (APRESOLINE) 25 MG tablet TAKE TWO (2) TABLETS BY MOUTH THREE TIMES DAILY. 180 tablet 3  . lisinopril (PRINIVIL,ZESTRIL) 10 MG tablet Take 10 mg by mouth 2 (two) times daily.     Marland Kitchen trimethoprim (TRIMPEX) 100 MG tablet Take 100 mg by mouth at bedtime.    . vitamin B-12 (CYANOCOBALAMIN) 1000 MCG tablet Take 1,000 mcg by mouth daily.     No current facility-administered medications for this encounter.    Social History   Socioeconomic History  . Marital status: Widowed    Spouse name: Not  on file  . Number of children: Not on file  . Years of education: Not on file  . Highest education level: Not on file  Occupational History    Employer: RETIRED    Comment: Retired former Therapist, art at [employer]  . Financial resource strain: Not on file  . Food insecurity    Worry: Not on file    Inability: Not on file  . Transportation needs    Medical: Not on file    Non-medical: Not on file  Tobacco Use  . Smoking status: Former Smoker    Types: Cigarettes    Quit date: 02/11/1995    Years since quitting: 23.6  . Smokeless tobacco: Never Used  Substance and Sexual Activity  . Alcohol use: Yes    Alcohol/week: 0.0 standard drinks    Comment: occ  . Drug use: No  . Sexual activity: Not on file  Lifestyle  . Physical activity    Days per week: Not on file    Minutes per session: Not on file  . Stress: Not on file  Relationships  . Social Herbalist on phone: Not on file    Gets together: Not on file    Attends religious service: Not on file    Active member of club or organization: Not on file    Attends meetings of clubs or organizations: Not on file    Relationship status: Not on file  Other Topics Concern  . Not on file  Social History Narrative  . Not on file   Family History  Problem Relation Age of Onset  . Heart attack Mother   . Celiac disease Brother   . CVA Maternal Grandmother   . Aortic aneurysm Maternal Grandfather     PHYSICAL EXAM: Vitals:   09/15/18 1424  BP: (!) 142/76  Pulse: 81  SpO2: 98%    General:  Sitting in WC. On O2  No resp difficulty HEENT: normal Neck: supple. JVP 7-8. Carotids 2+ bilat; + bruits. No lymphadenopathy or thryomegaly appreciated. Cor: PMI nondisplaced. Regular rate & rhythm. No rubs, gallops or murmurs. Lungs: clear but decreased air movement  Abdomen: soft, nontender, nondistended. No hepatosplenomegaly. No bruits or masses. Good bowel sounds. Extremities: no cyanosis, clubbing, rash,  1+ edema Neuro: alert & orientedx3, cranial nerves grossly intact. moves all 4 extremities w/o difficulty. Affect pleasant   ASSESSMENT & PLAN:  1. Pulmonary Hypertension - Likely WHO GROUP III - PA pressures not too bad on echo  - Repeat PFTs with DLCO show mixed obstruction/restriction with moderately decreased DLCO (actually better than I expected) -  Will order [place] sleep study  - Continue O2 - Can repeat RHC as needed  2. 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 Has been watching diuretic dosing due to GFR ~30 (Cr 1.3)  - PYP negative for TTR amyloid   3. A Fib, permanent - Rate Controlled. On cardizem. - Tolerating Eliquis well. No further epsitaxis  4. COPD - Stable. On Home O2. Have reached out to [facility] to see if they can help her get portable tank - PFTs - Refer to Pulmonary to help her get portable tank   5. HTN - BP relatively well controlled. Continue current regimen   6. DM2 - We discussed possibility of Jardiance but given self-cathing will not use SGLT2i   Daniel Bensimhon,MD 2:46 PM

## 2018-09-16 DIAGNOSIS — R339 Retention of urine, unspecified: Secondary | ICD-10-CM | POA: Diagnosis not present

## 2018-09-17 ENCOUNTER — Other Ambulatory Visit (HOSPITAL_COMMUNITY)
Admission: RE | Admit: 2018-09-17 | Discharge: 2018-09-17 | Disposition: A | Discharge status: Home / Self Care | Payer: PPO | Source: Ambulatory Visit | Attending: Cardiology | Admitting: Cardiology

## 2018-09-17 ENCOUNTER — Other Ambulatory Visit: Payer: Self-pay

## 2018-09-23 DIAGNOSIS — R413 Other amnesia: Secondary | ICD-10-CM | POA: Diagnosis not present

## 2018-09-30 DIAGNOSIS — Z299 Encounter for prophylactic measures, unspecified: Secondary | ICD-10-CM | POA: Diagnosis not present

## 2018-09-30 DIAGNOSIS — Z6831 Body mass index (BMI) 31.0-31.9, adult: Secondary | ICD-10-CM | POA: Diagnosis not present

## 2018-09-30 DIAGNOSIS — I509 Heart failure, unspecified: Secondary | ICD-10-CM | POA: Diagnosis not present

## 2018-09-30 DIAGNOSIS — I10 Essential (primary) hypertension: Secondary | ICD-10-CM | POA: Diagnosis not present

## 2018-09-30 DIAGNOSIS — E1122 Type 2 diabetes mellitus with diabetic chronic kidney disease: Secondary | ICD-10-CM | POA: Diagnosis not present

## 2018-09-30 DIAGNOSIS — R5383 Other fatigue: Secondary | ICD-10-CM | POA: Diagnosis not present

## 2018-10-02 ENCOUNTER — Other Ambulatory Visit: Payer: Self-pay

## 2018-10-02 ENCOUNTER — Inpatient Hospital Stay (HOSPITAL_COMMUNITY)
Admission: EM | Admit: 2018-10-02 | Discharge: 2018-10-04 | DRG: 291 | Disposition: A | Discharge status: Home Health | Payer: PPO | Attending: Internal Medicine | Admitting: Internal Medicine

## 2018-10-02 ENCOUNTER — Emergency Department (HOSPITAL_COMMUNITY): Payer: PPO

## 2018-10-02 ENCOUNTER — Encounter (HOSPITAL_COMMUNITY): Payer: Self-pay | Admitting: Emergency Medicine

## 2018-10-02 DIAGNOSIS — E538 Deficiency of other specified B group vitamins: Secondary | ICD-10-CM | POA: Diagnosis present

## 2018-10-02 DIAGNOSIS — Z8711 Personal history of peptic ulcer disease: Secondary | ICD-10-CM | POA: Diagnosis not present

## 2018-10-02 DIAGNOSIS — Z20828 Contact with and (suspected) exposure to other viral communicable diseases: Secondary | ICD-10-CM | POA: Diagnosis present

## 2018-10-02 DIAGNOSIS — J449 Chronic obstructive pulmonary disease, unspecified: Secondary | ICD-10-CM | POA: Diagnosis not present

## 2018-10-02 DIAGNOSIS — K900 Celiac disease: Secondary | ICD-10-CM | POA: Diagnosis not present

## 2018-10-02 DIAGNOSIS — E782 Mixed hyperlipidemia: Secondary | ICD-10-CM | POA: Diagnosis present

## 2018-10-02 DIAGNOSIS — Z8379 Family history of other diseases of the digestive system: Secondary | ICD-10-CM

## 2018-10-02 DIAGNOSIS — R0602 Shortness of breath: Secondary | ICD-10-CM | POA: Diagnosis not present

## 2018-10-02 DIAGNOSIS — J9611 Chronic respiratory failure with hypoxia: Secondary | ICD-10-CM | POA: Diagnosis not present

## 2018-10-02 DIAGNOSIS — E785 Hyperlipidemia, unspecified: Secondary | ICD-10-CM

## 2018-10-02 DIAGNOSIS — F419 Anxiety disorder, unspecified: Secondary | ICD-10-CM | POA: Diagnosis not present

## 2018-10-02 DIAGNOSIS — J9621 Acute and chronic respiratory failure with hypoxia: Secondary | ICD-10-CM | POA: Diagnosis present

## 2018-10-02 DIAGNOSIS — N183 Chronic kidney disease, stage 3 unspecified: Secondary | ICD-10-CM | POA: Diagnosis present

## 2018-10-02 DIAGNOSIS — Z91018 Allergy to other foods: Secondary | ICD-10-CM

## 2018-10-02 DIAGNOSIS — Z888 Allergy status to other drugs, medicaments and biological substances status: Secondary | ICD-10-CM

## 2018-10-02 DIAGNOSIS — I110 Hypertensive heart disease with heart failure: Secondary | ICD-10-CM | POA: Diagnosis not present

## 2018-10-02 DIAGNOSIS — G629 Polyneuropathy, unspecified: Secondary | ICD-10-CM | POA: Diagnosis present

## 2018-10-02 DIAGNOSIS — Z8249 Family history of ischemic heart disease and other diseases of the circulatory system: Secondary | ICD-10-CM

## 2018-10-02 DIAGNOSIS — I10 Essential (primary) hypertension: Secondary | ICD-10-CM

## 2018-10-02 DIAGNOSIS — Z79899 Other long term (current) drug therapy: Secondary | ICD-10-CM

## 2018-10-02 DIAGNOSIS — I4891 Unspecified atrial fibrillation: Secondary | ICD-10-CM | POA: Diagnosis present

## 2018-10-02 DIAGNOSIS — K219 Gastro-esophageal reflux disease without esophagitis: Secondary | ICD-10-CM | POA: Diagnosis present

## 2018-10-02 DIAGNOSIS — I5020 Unspecified systolic (congestive) heart failure: Secondary | ICD-10-CM

## 2018-10-02 DIAGNOSIS — E1121 Type 2 diabetes mellitus with diabetic nephropathy: Secondary | ICD-10-CM | POA: Diagnosis not present

## 2018-10-02 DIAGNOSIS — Z7901 Long term (current) use of anticoagulants: Secondary | ICD-10-CM

## 2018-10-02 DIAGNOSIS — I272 Pulmonary hypertension, unspecified: Secondary | ICD-10-CM | POA: Diagnosis not present

## 2018-10-02 DIAGNOSIS — Z87891 Personal history of nicotine dependence: Secondary | ICD-10-CM | POA: Diagnosis not present

## 2018-10-02 DIAGNOSIS — E049 Nontoxic goiter, unspecified: Secondary | ICD-10-CM | POA: Diagnosis present

## 2018-10-02 DIAGNOSIS — I5033 Acute on chronic diastolic (congestive) heart failure: Secondary | ICD-10-CM | POA: Diagnosis not present

## 2018-10-02 DIAGNOSIS — I5043 Acute on chronic combined systolic (congestive) and diastolic (congestive) heart failure: Secondary | ICD-10-CM | POA: Diagnosis present

## 2018-10-02 DIAGNOSIS — I445 Left posterior fascicular block: Secondary | ICD-10-CM | POA: Diagnosis not present

## 2018-10-02 DIAGNOSIS — I480 Paroxysmal atrial fibrillation: Secondary | ICD-10-CM | POA: Diagnosis not present

## 2018-10-02 DIAGNOSIS — Z9010 Acquired absence of unspecified breast and nipple: Secondary | ICD-10-CM

## 2018-10-02 DIAGNOSIS — Z885 Allergy status to narcotic agent status: Secondary | ICD-10-CM | POA: Diagnosis not present

## 2018-10-02 DIAGNOSIS — J810 Acute pulmonary edema: Secondary | ICD-10-CM

## 2018-10-02 DIAGNOSIS — Z853 Personal history of malignant neoplasm of breast: Secondary | ICD-10-CM | POA: Diagnosis not present

## 2018-10-02 DIAGNOSIS — I130 Hypertensive heart and chronic kidney disease with heart failure and stage 1 through stage 4 chronic kidney disease, or unspecified chronic kidney disease: Principal | ICD-10-CM | POA: Diagnosis present

## 2018-10-02 DIAGNOSIS — E1122 Type 2 diabetes mellitus with diabetic chronic kidney disease: Secondary | ICD-10-CM | POA: Diagnosis present

## 2018-10-02 DIAGNOSIS — J811 Chronic pulmonary edema: Secondary | ICD-10-CM | POA: Diagnosis present

## 2018-10-02 DIAGNOSIS — Z66 Do not resuscitate: Secondary | ICD-10-CM | POA: Diagnosis present

## 2018-10-02 DIAGNOSIS — R0902 Hypoxemia: Secondary | ICD-10-CM | POA: Diagnosis not present

## 2018-10-02 LAB — MAGNESIUM: Magnesium: 2.1 mg/dL (ref 1.7–2.4)

## 2018-10-02 LAB — COMPREHENSIVE METABOLIC PANEL
ALT: 14 U/L (ref 0–44)
AST: 13 U/L — ABNORMAL LOW (ref 15–41)
Albumin: 3.4 g/dL — ABNORMAL LOW (ref 3.5–5.0)
Alkaline Phosphatase: 78 U/L (ref 38–126)
Anion gap: 3 — ABNORMAL LOW (ref 5–15)
BUN: 30 mg/dL — ABNORMAL HIGH (ref 8–23)
CO2: 35 mmol/L — ABNORMAL HIGH (ref 22–32)
Calcium: 8.3 mg/dL — ABNORMAL LOW (ref 8.9–10.3)
Chloride: 100 mmol/L (ref 98–111)
Creatinine, Ser: 1.54 mg/dL — ABNORMAL HIGH (ref 0.44–1.00)
GFR calc Af Amer: 37 mL/min — ABNORMAL LOW (ref >60)
GFR calc non Af Amer: 32 mL/min — ABNORMAL LOW (ref >60)
Glucose, Bld: 151 mg/dL — ABNORMAL HIGH (ref 70–99)
Potassium: 5 mmol/L (ref 3.5–5.1)
Sodium: 138 mmol/L (ref 135–145)
Total Bilirubin: 1.8 mg/dL — ABNORMAL HIGH (ref 0.3–1.2)
Total Protein: 6.3 g/dL — ABNORMAL LOW (ref 6.5–8.1)

## 2018-10-02 LAB — SARS CORONAVIRUS 2 BY RT PCR (HOSPITAL ORDER, PERFORMED IN ~~LOC~~ HOSPITAL LAB): SARS Coronavirus 2: NEGATIVE

## 2018-10-02 LAB — CBC WITH DIFFERENTIAL/PLATELET
Abs Immature Granulocytes: 0.22 10*3/uL — ABNORMAL HIGH (ref 0.00–0.07)
Basophils Absolute: 0 10*3/uL (ref 0.0–0.1)
Basophils Relative: 0 %
Eosinophils Absolute: 0.1 10*3/uL (ref 0.0–0.5)
Eosinophils Relative: 1 %
HCT: 30.8 % — ABNORMAL LOW (ref 36.0–46.0)
Hemoglobin: 9 g/dL — ABNORMAL LOW (ref 12.0–15.0)
Immature Granulocytes: 2 %
Lymphocytes Relative: 8 %
Lymphs Abs: 0.7 10*3/uL (ref 0.7–4.0)
MCH: 26.6 pg (ref 26.0–34.0)
MCHC: 29.2 g/dL — ABNORMAL LOW (ref 30.0–36.0)
MCV: 91.1 fL (ref 80.0–100.0)
Monocytes Absolute: 0.9 10*3/uL (ref 0.1–1.0)
Monocytes Relative: 10 %
Neutro Abs: 7.4 10*3/uL (ref 1.7–7.7)
Neutrophils Relative %: 79 %
Platelets: 318 10*3/uL (ref 150–400)
RBC: 3.38 MIL/uL — ABNORMAL LOW (ref 3.87–5.11)
RDW: 14.1 % (ref 11.5–15.5)
WBC: 9.4 10*3/uL (ref 4.0–10.5)
nRBC: 0 % (ref 0.0–0.2)

## 2018-10-02 LAB — BRAIN NATRIURETIC PEPTIDE: B Natriuretic Peptide: 602 pg/mL — ABNORMAL HIGH (ref 0.0–100.0)

## 2018-10-02 LAB — GLUCOSE, CAPILLARY
Glucose-Capillary: 134 mg/dL — ABNORMAL HIGH (ref 70–99)
Glucose-Capillary: 86 mg/dL (ref 70–99)

## 2018-10-02 LAB — TSH: TSH: 0.38 u[IU]/mL (ref 0.350–4.500)

## 2018-10-02 LAB — D-DIMER, QUANTITATIVE: D-Dimer, Quant: 1.5 ug/mL-FEU — ABNORMAL HIGH (ref 0.00–0.50)

## 2018-10-02 LAB — TYPE AND SCREEN
ABO/RH(D): O POS
Antibody Screen: NEGATIVE

## 2018-10-02 LAB — TROPONIN I (HIGH SENSITIVITY)
Troponin I (High Sensitivity): 7 ng/L (ref <18)
Troponin I (High Sensitivity): 6 ng/L (ref <18)

## 2018-10-02 MED ORDER — GABAPENTIN 100 MG PO CAPS
100.0000 mg | ORAL_CAPSULE | Freq: Two times a day (BID) | ORAL | Status: DC
  Administered 2018-10-02 – 2018-10-04 (×4): 100 mg via ORAL
  Filled 2018-10-02 (×4): qty 1

## 2018-10-02 MED ORDER — ALPRAZOLAM 1 MG PO TABS
1.0000 mg | ORAL_TABLET | Freq: Every day | ORAL | Status: DC
  Administered 2018-10-02 – 2018-10-03 (×2): 1 mg via ORAL
  Filled 2018-10-02 (×2): qty 1

## 2018-10-02 MED ORDER — SODIUM CHLORIDE 0.9% FLUSH
3.0000 mL | Freq: Two times a day (BID) | INTRAVENOUS | Status: DC
  Administered 2018-10-02 – 2018-10-04 (×4): 3 mL via INTRAVENOUS

## 2018-10-02 MED ORDER — FUROSEMIDE 10 MG/ML IJ SOLN
40.0000 mg | Freq: Once | INTRAMUSCULAR | Status: AC
  Administered 2018-10-02: 40 mg via INTRAVENOUS

## 2018-10-02 MED ORDER — ATORVASTATIN CALCIUM 10 MG PO TABS
10.0000 mg | ORAL_TABLET | Freq: Every evening | ORAL | Status: DC
  Administered 2018-10-02 – 2018-10-03 (×2): 10 mg via ORAL
  Filled 2018-10-02 (×2): qty 1

## 2018-10-02 MED ORDER — CARVEDILOL 12.5 MG PO TABS
25.0000 mg | ORAL_TABLET | Freq: Two times a day (BID) | ORAL | Status: DC
  Administered 2018-10-02 – 2018-10-04 (×4): 25 mg via ORAL
  Filled 2018-10-02 (×4): qty 2

## 2018-10-02 MED ORDER — FUROSEMIDE 10 MG/ML IJ SOLN
40.0000 mg | Freq: Once | INTRAMUSCULAR | Status: AC
  Administered 2018-10-02: 13:00:00 40 mg via INTRAVENOUS
  Filled 2018-10-02: qty 4

## 2018-10-02 MED ORDER — ACETAMINOPHEN 325 MG PO TABS: 650.0000 mg | ORAL_TABLET | ORAL | Status: DC | PRN

## 2018-10-02 MED ORDER — FUROSEMIDE 10 MG/ML IJ SOLN
60.0000 mg | Freq: Two times a day (BID) | INTRAMUSCULAR | Status: DC
  Administered 2018-10-02 – 2018-10-03 (×2): 60 mg via INTRAVENOUS
  Filled 2018-10-02 (×3): qty 6

## 2018-10-02 MED ORDER — HYDRALAZINE HCL 25 MG PO TABS
25.0000 mg | ORAL_TABLET | Freq: Three times a day (TID) | ORAL | Status: DC
  Administered 2018-10-02 – 2018-10-04 (×6): 25 mg via ORAL
  Filled 2018-10-02 (×6): qty 1

## 2018-10-02 MED ORDER — VITAMIN B-12 1000 MCG PO TABS
1000.0000 ug | ORAL_TABLET | Freq: Every day | ORAL | Status: DC
  Administered 2018-10-03 – 2018-10-04 (×2): 1000 ug via ORAL
  Filled 2018-10-02 (×3): qty 1

## 2018-10-02 MED ORDER — APIXABAN 5 MG PO TABS
5.0000 mg | ORAL_TABLET | Freq: Two times a day (BID) | ORAL | Status: DC
  Administered 2018-10-02 – 2018-10-04 (×5): 5 mg via ORAL
  Filled 2018-10-02 (×5): qty 1

## 2018-10-02 MED ORDER — METOLAZONE 2.5 MG PO TABS
2.5000 mg | ORAL_TABLET | Freq: Every day | ORAL | Status: DC
  Administered 2018-10-02 – 2018-10-03 (×2): 2.5 mg via ORAL
  Filled 2018-10-02 (×4): qty 1

## 2018-10-02 MED ORDER — SODIUM CHLORIDE 0.9 % IV SOLN
1.0000 g | Freq: Once | INTRAVENOUS | Status: DC
  Filled 2018-10-02: qty 10

## 2018-10-02 MED ORDER — ONDANSETRON HCL 4 MG/2ML IJ SOLN: 4.0000 mg | Freq: Four times a day (QID) | INTRAMUSCULAR | Status: DC | PRN

## 2018-10-02 MED ORDER — DILTIAZEM HCL ER COATED BEADS 120 MG PO CP24
240.0000 mg | ORAL_CAPSULE | Freq: Every day | ORAL | Status: DC
  Administered 2018-10-03 – 2018-10-04 (×2): 240 mg via ORAL
  Filled 2018-10-02 (×3): qty 2

## 2018-10-02 MED ORDER — FUROSEMIDE 10 MG/ML IJ SOLN
40.0000 mg | Freq: Once | INTRAMUSCULAR | Status: AC
  Administered 2018-10-02: 40 mg via INTRAVENOUS
  Filled 2018-10-02: qty 4

## 2018-10-02 MED ORDER — SODIUM CHLORIDE 0.9 % IV SOLN
500.0000 mg | Freq: Once | INTRAVENOUS | Status: DC
  Filled 2018-10-02: qty 500

## 2018-10-02 MED ORDER — SODIUM CHLORIDE 0.9 % IV SOLN: 250.0000 mL | INTRAVENOUS | Status: DC | PRN

## 2018-10-02 MED ORDER — INSULIN ASPART 100 UNIT/ML ~~LOC~~ SOLN
0.0000 [IU] | Freq: Three times a day (TID) | SUBCUTANEOUS | Status: DC
  Administered 2018-10-04: 1 [IU] via SUBCUTANEOUS

## 2018-10-02 MED ORDER — SODIUM CHLORIDE 0.9% FLUSH: 3.0000 mL | INTRAVENOUS | Status: DC | PRN

## 2018-10-02 MED ORDER — METOLAZONE 2.5 MG PO TABS: 2.5000 mg | ORAL_TABLET | Freq: Once | ORAL | Status: DC

## 2018-10-02 MED ORDER — INSULIN DETEMIR 100 UNIT/ML ~~LOC~~ SOLN
5.0000 [IU] | Freq: Every day | SUBCUTANEOUS | Status: DC
  Administered 2018-10-02 – 2018-10-03 (×2): 5 [IU] via SUBCUTANEOUS
  Filled 2018-10-02 (×3): qty 0.05

## 2018-10-02 NOTE — ED Triage Notes (Signed)
Pt c/o of SHOB x2 days. Per son her blood work showed a low HGB and was told that she needed a blood transfusion.

## 2018-10-02 NOTE — ED Provider Notes (Signed)
[facility] EMERGENCY DEPARTMENT Provider Note   CSN: [id number] Arrival date & time: 10/02/18  8184     History   Chief Complaint Chief Complaint  Patient presents with   Shortness of Breath    HPI Wanda [name] is a 78 y.o. female.     Patient complains of shortness of breath.  This been going on for 24 hours.  Patient has history of heart failure  The history is provided by the patient. No language interpreter was used.  Shortness of Breath Severity:  Moderate Onset quality:  Sudden Timing:  Constant Progression:  Worsening Chronicity:  New Context: activity   Relieved by:  Nothing Worsened by:  Nothing Ineffective treatments:  None tried Associated symptoms: no abdominal pain, no chest pain, no cough, no headaches and no rash     Past Medical History:  Diagnosis Date   B12 deficiency    Celiac disease    COPD (chronic obstructive pulmonary disease) ([place])    Essential hypertension    Goiter    History of breast cancer    Status post mastectomy   History of cardiac catheterization    No significant CAD 2006   Mixed hyperlipidemia    Neuropathy    Paroxysmal atrial fibrillation (HCC)    Coumadin therapy   PUD (peptic ulcer disease)    Pulmonary hypertension ([place])    Type 2 diabetes mellitus ([place])     Patient Active Problem List   Diagnosis Date Noted   Atrial fibrillation [facility])    Pulmonary hypertension ([place])    Long term (current) use of anticoagulants 05/03/2010   HYPERLIPIDEMIA-MIXED 01/26/2009   Essential hypertension, benign 01/26/2009   PREMATURE VENTRICULAR CONTRACTIONS 01/26/2009    Past Surgical History:  Procedure Laterality Date   DILATION AND CURETTAGE OF UTERUS     HYSTEROSCOPY W/ ENDOMETRIAL ABLATION     [employer]   MASTECTOMY       OB History   No obstetric history on file.      Home Medications    Prior to Admission medications   Medication Sig Start Date End Date Taking? Authorizing  Provider  ALPRAZolam Duanne Moron) 1 MG tablet Take 1 mg by mouth at bedtime.     [provider]  apixaban (ELIQUIS) 5 MG TABS tablet Take 1 tablet (5 mg total) by mouth 2 (two) times daily. 12/03/17   Satira Sark, MD  atorvastatin (LIPITOR) 10 MG tablet Take 10 mg by mouth every evening.     [provider]  carvedilol (COREG) 25 MG tablet Take 25 mg by mouth 2 (two) times daily with a meal.  02/26/18   [provider]  diltiazem (CARDIZEM CD) 240 MG 24 hr capsule TAKE ONE CAPSULE BY MOUTH DAILY 07/08/18   Satira Sark, MD  furosemide (LASIX) 20 MG tablet Take 0.5 tablets (10 mg total) by mouth daily as needed. 09/15/18   Bensimhon, Shaune Pascal, MD  gabapentin (NEURONTIN) 100 MG capsule Take 100 mg by mouth 2 (two) times daily.    [provider]  glimepiride (AMARYL) 2 MG tablet Take 2 mg by mouth every morning. 02/11/18   [provider]  hydrALAZINE (APRESOLINE) 25 MG tablet TAKE TWO (2) TABLETS BY MOUTH THREE TIMES DAILY. 06/14/18   Bensimhon, Shaune Pascal, MD  lisinopril (PRINIVIL,ZESTRIL) 10 MG tablet Take 10 mg by mouth 2 (two) times daily.     [provider]  trimethoprim (TRIMPEX) 100 MG tablet Take 100 mg by mouth at bedtime.  [provider]  vitamin B-12 (CYANOCOBALAMIN) 1000 MCG tablet Take 1,000 mcg by mouth daily.    [provider]    Family History Family History  Problem Relation Age of Onset   Heart attack Mother    Celiac disease Brother    CVA Maternal Grandmother    Aortic aneurysm Maternal Grandfather     Social History Social History   Tobacco Use   Smoking status: Former Smoker    Types: Cigarettes    Quit date: 02/11/1995    Years since quitting: 23.6   Smokeless tobacco: Never Used  Substance Use Topics   Alcohol use: Yes    Alcohol/week: 0.0 standard drinks    Comment: occ   Drug use: No     Allergies   Gluten, Hydromorphone hcl, and Prednisone   Review of  Systems Review of Systems  Constitutional: Negative for appetite change and fatigue.  HENT: Negative for congestion, ear discharge and sinus pressure.   Eyes: Negative for discharge.  Respiratory: Positive for shortness of breath. Negative for cough.   Cardiovascular: Negative for chest pain.  Gastrointestinal: Negative for abdominal pain and diarrhea.  Genitourinary: Negative for frequency and hematuria.  Musculoskeletal: Negative for back pain.  Skin: Negative for rash.  Neurological: Negative for seizures and headaches.  Psychiatric/Behavioral: Negative for hallucinations.     Physical Exam Updated Vital Signs BP (!) 107/57    Pulse 74    Temp (!) 97.3 F (36.3 C) (Axillary)    Resp (!) 29    Ht _0  (1.727 m)    Wt 102.1 kg    SpO2 94%    BMI 34.21 kg/m   Physical Exam Vitals signs and nursing note reviewed.  Constitutional:      Appearance: She is well-developed.  HENT:     Head: Normocephalic.     Nose: Nose normal.  Eyes:     General: No scleral icterus.    Conjunctiva/sclera: Conjunctivae normal.  Neck:     Musculoskeletal: Neck supple.     Thyroid: No thyromegaly.  Cardiovascular:     Rate and Rhythm: Normal rate and regular rhythm.     Heart sounds: No murmur. No friction rub. No gallop.   Pulmonary:     Breath sounds: No stridor. No wheezing or rales.     Comments: Tachypneic Chest:     Chest wall: No tenderness.  Abdominal:     General: There is no distension.     Tenderness: There is no abdominal tenderness. There is no rebound.  Musculoskeletal: Normal range of motion.  Lymphadenopathy:     Cervical: No cervical adenopathy.  Skin:    Findings: No erythema or rash.  Neurological:     Mental Status: She is oriented to person, place, and time.     Motor: No abnormal muscle tone.     Coordination: Coordination normal.  Psychiatric:        Behavior: Behavior normal.      ED Treatments / Results  Labs (all labs ordered are listed, but only  abnormal results are displayed) Labs Reviewed  CBC WITH DIFFERENTIAL/PLATELET - Abnormal; Notable for the following components:      Result Value   RBC 3.38 (*)    Hemoglobin 9.0 (*)    HCT 30.8 (*)    MCHC 29.2 (*)    Abs Immature Granulocytes 0.22 (*)    All other components within normal limits  COMPREHENSIVE METABOLIC PANEL - Abnormal; Notable for the following components:  CO2 35 (*)    Glucose, Bld 151 (*)    BUN 30 (*)    Creatinine, Ser 1.54 (*)    Calcium 8.3 (*)    Total Protein 6.3 (*)    Albumin 3.4 (*)    AST 13 (*)    Total Bilirubin 1.8 (*)    GFR calc non Af Amer 32 (*)    GFR calc Af Amer 37 (*)    Anion gap 3 (*)    All other components within normal limits  D-DIMER, QUANTITATIVE (NOT AT [facility]) - Abnormal; Notable for the following components:   D-Dimer, Quant 1.50 (*)    All other components within normal limits  BRAIN NATRIURETIC PEPTIDE - Abnormal; Notable for the following components:   B Natriuretic Peptide 602.0 (*)    All other components within normal limits  SARS CORONAVIRUS 2 (HOSPITAL ORDER, [facility])  TYPE AND SCREEN  TROPONIN I (HIGH SENSITIVITY)  TROPONIN I (HIGH SENSITIVITY)    EKG None  Radiology Dg Chest Port 1 View  Result Date: 10/02/2018 CLINICAL DATA:  Patient with shortness of breath for 2 days. EXAM: PORTABLE CHEST 1 VIEW COMPARISON:  Chest radiograph 04/15/2018 FINDINGS: Cardiomegaly. Tortuosity of the thoracic aorta. Monitoring leads overlie the patient. Diffuse bilateral interstitial pulmonary opacities. Small bilateral pleural effusions. No pneumothorax. Prominence of the right hilum. IMPRESSION: Cardiomegaly. Diffuse bilateral interstitial pulmonary opacities may represent pulmonary edema. Atypical infection or hemorrhage not excluded. Prominence of the right hilum. Recommend attention on short-term follow-up to exclude the possibility of underlying mass. Small bilateral pleural effusions.  Electronically Signed   By: Lovey Newcomer M.D.   On: 10/02/2018 09:00    Procedures Procedures (including critical care time)  Medications Ordered in ED Medications  metolazone (ZAROXOLYN) tablet 2.5 mg (has no administration in time range)  furosemide (LASIX) injection 40 mg (40 mg Intravenous Given 10/02/18 1111)  furosemide (LASIX) injection 40 mg (40 mg Intravenous Given 10/02/18 1116)     Initial Impression / Assessment and Plan / ED Course  I have reviewed the triage vital signs and the nursing notes.  Pertinent labs & imaging results that were available during my care of the patient were reviewed by me and considered in my medical decision making (see chart for details).        CRITICAL CARE Performed by: Milton Ferguson Total critical care time: 45 minutes Critical care time was exclusive of separately billable procedures and treating other patients. Critical care was necessary to treat or prevent imminent or life-threatening deterioration. Critical care was time spent personally by me on the following activities: development of treatment plan with patient and/or surrogate as well as nursing, discussions with consultants, evaluation of patient's response to treatment, examination of patient, obtaining history from patient or surrogate, ordering and performing treatments and interventions, ordering and review of laboratory studies, ordering and review of radiographic studies, pulse oximetry and re-evaluation of patient's condition. Patient in congestive heart failure.  She is put on BiPAP and given diuretics and will be admitted to medicine  Final Clinical Impressions(s) / ED Diagnoses   Final diagnoses:  Systolic congestive heart failure, unspecified HF chronicity [facility])    ED Discharge Orders    None       Milton Ferguson, MD 10/02/18 1141

## 2018-10-02 NOTE — H&P (Signed)
History and Physical    Wanda [name] RXV:[id number] DOB: 1940-02-26 DOA: 10/02/2018  Referring MD/NP/PA: Dr. Roderic Palau PCP: Glenda Chroman, MD  Patient coming from: Home  Chief Complaint: Shortness of breath, increased lower extremity edema  HPI: Wanda [name] is a 78 y.o. female with past medical history significant for chronic respiratory failure with hypoxia (using 2-4 L nasal cannula), COPD, hypertension, hyperlipidemia, gastroesophageal reflux disease, type 2 diabetes with nephropathy, chronic kidney disease is stage III, chronic paroxysmal atrial fibrillation (chronically on Eliquis), chronic diastolic heart failure and pulmonary hypertension; who presented to the emergency department secondary to increase shortness of breath lower extremity swelling.  Symptoms have been present for the last 3-4 days and worsening.  Patient reports that she has not been using any Lasix or closely following her weight on daily basis of previous instructed.  No chest pain, no nausea, no vomiting, no dysuria, no hematuria, no melena, no hematochezia, no headaches, no focal weakness, no fever, no chills, no sick contacts.  COVID test negative.  Chest x-ray demonstrating vascular congestion with acute pulmonary edema, elevated BNP, normal WBCs stable renal function.  Patient respiratory rate was in the mid 30s and she was saturating in the low 80s on 4 L nasal cannula; IV Lasix and BiPAP were provided as part of treatment in the ED.  [facility] contacted to admit patient for further evaluation and management of CHF exacerbation.  Past Medical/Surgical History: Past Medical History:  Diagnosis Date  . B12 deficiency   . Celiac disease   . COPD (chronic obstructive pulmonary disease) ([place])   . Essential hypertension   . Goiter   . History of breast cancer    Status post mastectomy  . History of cardiac catheterization    No significant CAD 2006  . Mixed hyperlipidemia   . Neuropathy   . Paroxysmal atrial  fibrillation (HCC)    Coumadin therapy  . PUD (peptic ulcer disease)   . Pulmonary hypertension ([place])   . Type 2 diabetes mellitus ([place])     Past Surgical History:  Procedure Laterality Date  . DILATION AND CURETTAGE OF UTERUS    . HYSTEROSCOPY W/ ENDOMETRIAL ABLATION     [employer]  . MASTECTOMY      Social History:  reports that she quit smoking about 23 years ago. Her smoking use included cigarettes. She has never used smokeless tobacco. She reports current alcohol use. She reports that she does not use drugs.  Allergies: Allergies  Allergen Reactions  . Gluten Diarrhea  . Hydromorphone Hcl Other (See Comments)    REACTION: lowers BP levels  . Prednisone Other (See Comments)    DOES NOT LIKE SIDE EFFECTS, INTERACTION WITH WARFARIN    Family History:  Family History  Problem Relation Age of Onset  . Heart attack Mother   . Celiac disease Brother   . CVA Maternal Grandmother   . Aortic aneurysm Maternal Grandfather     Prior to Admission medications   Medication Sig Start Date End Date Taking? Authorizing Provider  ALPRAZolam Duanne Moron) 1 MG tablet Take 1 mg by mouth at bedtime.    Yes [provider]  apixaban (ELIQUIS) 5 MG TABS tablet Take 1 tablet (5 mg total) by mouth 2 (two) times daily. 12/03/17  Yes Satira Sark, MD  atorvastatin (LIPITOR) 10 MG tablet Take 10 mg by mouth every evening.    Yes [provider]  carvedilol (COREG) 25 MG tablet Take 25 mg by mouth 2 (two) times  daily with a meal.  02/26/18  Yes [provider]  diltiazem (CARDIZEM CD) 240 MG 24 hr capsule TAKE ONE CAPSULE BY MOUTH DAILY 07/08/18  Yes Satira Sark, MD  furosemide (LASIX) 20 MG tablet Take 0.5 tablets (10 mg total) by mouth daily as needed. 09/15/18  Yes Bensimhon, Shaune Pascal, MD  gabapentin (NEURONTIN) 100 MG capsule Take 100 mg by mouth 2 (two) times daily.   Yes [provider]  glimepiride (AMARYL) 2 MG tablet Take 2 mg by mouth every  morning. 02/11/18  Yes [provider]  hydrALAZINE (APRESOLINE) 25 MG tablet TAKE TWO (2) TABLETS BY MOUTH THREE TIMES DAILY. 06/14/18  Yes Bensimhon, Shaune Pascal, MD  lisinopril (PRINIVIL,ZESTRIL) 10 MG tablet Take 10 mg by mouth 2 (two) times daily.    Yes [provider]  trimethoprim (TRIMPEX) 100 MG tablet Take 100 mg by mouth at bedtime.   Yes [provider]  vitamin B-12 (CYANOCOBALAMIN) 1000 MCG tablet Take 1,000 mcg by mouth daily.   Yes [provider]    Review of Systems:  Negative except as otherwise mentioned in HPI.  Physical Exam: Vitals:   10/02/18 1330 10/02/18 1400 10/02/18 1449 10/02/18 1454  BP: 140/80 123/87 (!) 145/75   Pulse: 87 91 95 89  Resp: 20 (!) 27 18   Temp:   97.7 F (36.5 C)   TempSrc:   Oral   SpO2: (!) 89% 90% (!) 80% 90%  Weight:      Height:        Constitutional: Mild distress secondary to increased shortness of breath and positive tachypnea.  No chest pain, no nausea, no vomiting, no fever. Eyes: PERRL, lids and conjunctivae normal, no icterus, no nystagmus. ENMT: Mucous membranes are moist. Posterior pharynx clear of any exudate or lesions.  Neck: normal, supple, no masses, no thyromegaly, mild JVD. Respiratory: clear to auscultation bilaterally, no wheezing, no crackles. Normal respiratory effort. No accessory muscle use.  Cardiovascular: Regular rate and rhythm, no murmurs / rubs / gallops. Trace lower extremity edema bilaterally. 2+ pedal pulses. No carotid bruits.  Abdomen: no tenderness, no masses palpated.  Increased abdominal girth appreciated on exam.  Positive bowel sounds.    Musculoskeletal: no clubbing / cyanosis. No joint deformity upper and lower extremities. Good ROM, no contractures.  Skin: Extensive bruise/ecchymosis over her right lower extremity lateral aspect extending from her mid calf all the way to her hip and gluteal area. Neurologic: CN 2-12 grossly intact. Sensation intact, DTR normal.  Strength 5/5 in all 4.  Psychiatric: Normal judgment and insight. Alert and oriented x 3. Normal mood.    Labs on Admission: I have personally reviewed the following labs and imaging studies  CBC: Recent Labs  Lab 10/02/18 0742  WBC 9.4  NEUTROABS 7.4  HGB 9.0*  HCT 30.8*  MCV 91.1  PLT 151   Basic Metabolic Panel: Recent Labs  Lab 10/02/18 0742  NA 138  K 5.0  CL 100  CO2 35*  GLUCOSE 151*  BUN 30*  CREATININE 1.54*  CALCIUM 8.3*   GFR: Estimated Creatinine Clearance: 37 mL/min (A) (by C-G formula based on SCr of 1.54 mg/dL (H)).   Liver Function Tests: Recent Labs  Lab 10/02/18 0742  AST 13*  ALT 14  ALKPHOS 78  BILITOT 1.8*  PROT 6.3*  ALBUMIN 3.4*   Cardiac Enzymes: High sensitive troponin negative x2 (7 and 6 respectively).  BNP (last 3 results) 602.  Recent Results (from the past  240 hour(s))  SARS Coronavirus 2 [facility] order, Performed in [facility] hospital lab) Nasopharyngeal Nasopharyngeal Swab     Status: None   Collection Time: 10/02/18  7:43 AM   Specimen: Nasopharyngeal Swab  Result Value Ref Range Status   SARS Coronavirus 2 NEGATIVE NEGATIVE Final    Comment: (NOTE) If result is NEGATIVE SARS-CoV-2 target nucleic acids are NOT DETECTED. The SARS-CoV-2 RNA is generally detectable in upper and lower  respiratory specimens during the acute phase of infection. The lowest  concentration of SARS-CoV-2 viral copies this assay can detect is 250  copies / mL. A negative result does not preclude SARS-CoV-2 infection  and should not be used as the sole basis for treatment or other  patient management decisions.  A negative result may occur with  improper specimen collection / handling, submission of specimen other  than nasopharyngeal swab, presence of viral mutation(s) within the  areas targeted by this assay, and inadequate number of viral copies  (<250 copies / mL). A negative result must be combined with clinical  observations, patient  history, and epidemiological information. If result is POSITIVE SARS-CoV-2 target nucleic acids are DETECTED. The SARS-CoV-2 RNA is generally detectable in upper and lower  respiratory specimens dur ing the acute phase of infection.  Positive  results are indicative of active infection with SARS-CoV-2.  Clinical  correlation with patient history and other diagnostic information is  necessary to determine patient infection status.  Positive results do  not rule out bacterial infection or co-infection with other viruses. If result is PRESUMPTIVE POSTIVE SARS-CoV-2 nucleic acids MAY BE PRESENT.   A presumptive positive result was obtained on the submitted specimen  and confirmed on repeat testing.  While 2019 novel coronavirus  (SARS-CoV-2) nucleic acids may be present in the submitted sample  additional confirmatory testing may be necessary for epidemiological  and / or clinical management purposes  to differentiate between  SARS-CoV-2 and other Sarbecovirus currently known to infect humans.  If clinically indicated additional testing with an alternate test  methodology [phone]) is advised. The SARS-CoV-2 RNA is generally  detectable in upper and lower respiratory sp ecimens during the acute  phase of infection. The expected result is Negative. Fact Sheet for Patients:  StrictlyIdeas.no Fact Sheet for Healthcare Providers: BankingDealers.co.za This test is not yet approved or cleared by the [place] FDA and has been authorized for detection and/or diagnosis of SARS-CoV-2 by FDA under an Emergency Use Authorization (EUA).  This EUA will remain in effect (meaning this test can be used) for the duration of the COVID-19 declaration under Section 564(b)(1) of the Act, 21 U.S.C. section 360bbb-3(b)(1), unless the authorization is terminated or revoked sooner. Performed at [facility], [address], [place], [place] [address]       Radiological Exams on Admission: Dg Chest [facility] 1 View  Result Date: 10/02/2018 CLINICAL DATA:  Patient with shortness of breath for 2 days. EXAM: PORTABLE CHEST 1 VIEW COMPARISON:  Chest radiograph 04/15/2018 FINDINGS: Cardiomegaly. Tortuosity of the thoracic aorta. Monitoring leads overlie the patient. Diffuse bilateral interstitial pulmonary opacities. Small bilateral pleural effusions. No pneumothorax. Prominence of the right hilum. IMPRESSION: Cardiomegaly. Diffuse bilateral interstitial pulmonary opacities may represent pulmonary edema. Atypical infection or hemorrhage not excluded. Prominence of the right hilum. Recommend attention on short-term follow-up to exclude the possibility of underlying mass. Small bilateral pleural effusions. Electronically Signed   By: Lovey Newcomer M.D.   On: 10/02/2018 09:00    EKG: Independently reviewed.  No  acute ischemic changes.  Rate controlled; positive atrial fibrillation.  Assessment/Plan 1-acute on chronic diastolic HF (heart failure) ([place]) -Recent 2D echo (January 2020) demonstrated ejection fraction 65 to 70%, indefinite parameters by Doppler summary for diastolic dysfunction at that time.  -But Patient with chronic atrial fibrillation. -Elevated BNP in the 600 range and pulmonary edema with vascular congestion on chest x-ray -Patient with respiratory rate in the 40s range on admission requiring to be shortly placed on BiPAP. -Excellent response to 80 mg of Lasix and 2.5 mg of metolazone on arrival with over 1.6 L diuresed. -Patient has been admitted to telemetry bed for further management of her CHF exacerbation -Follow daily weights, strict intake and output and low-sodium diet -Lasix 60 mg every 12 hours has been ordered -Close follow-up outpatient renal function and electrolytes. -No acute ischemic changes on EKG appreciated; troponin negative times so.  2-acute on chronic respiratory failure with hypoxia -In the setting of COPD, pulmonary  hypertension and as mentioned above acute on chronic diastolic CHF. -Will continue oxygen supplementation and titrate as tolerated to chronic supplementation (2-4 L nasal cannula). -Continue PRN nebulizer; treatment for acute CHF exacerbation as mentioned above; continue the use of hydralazine to assist with pulmonary hypertension management.  3-HLD (hyperlipidemia) -Continue statins  4-Essential hypertension, benign -Stable and overall well-controlled -Will continue current antihypertensive regimen  5-Long term (current) use of anticoagulants/Atrial fibrillation (HCC) -Chronic paroxysmal atrial fibrillation -Rate controlled -Continue cardizem, carvedilol and Eliquis  6-Type 2 diabetes with nephropathy (HCC) -Hold oral hypoglycemic agents -Check A1c -Will use sliding scale insulin and low-dose Levemir while inpatient.  7-Chronic kidney disease, stage III (moderate) (HCC) -Appears to be stable and currently at baseline -Follow closely while acutely providing diuresis -No dysuria.  8-GERD (gastroesophageal reflux disease) -Continue PPI.Marland Kitchen  9-anxiety -Stable mood -Continue home dose of Xanax.  DVT prophylaxis: Chronically on Eliquis Code Status: DNR/DNI Family Communication: Son updated over the phone. Disposition Plan: anticipate discharge back home once volume status and breathing stabilized. Consults called: Discussed with her from routine over the phone (Dr. Haroldine Laws). Admission status: Inpatient, length of stay more than 2 midnights; telemetry bed.  In my clinical judgment based on patient's acute on chronic diastolic heart failure exacerbation will require more than 2 midnights inpatient treatment to further stabilize her respiratory status.  Given underlying history of atrial fibrillation, chronic kidney disease is stage III, chronic use of anticoagulation, COPD, pulmonary hypertension and acute findings of pulmonary edema patient is at high risk of decompensation and even  death if no acutely treated in the hospital setting.   Time Spent: 70 minutes.  Barton Dubois MD [facility] Pager [phone]   10/02/2018, 5:02 PM

## 2018-10-03 ENCOUNTER — Encounter (HOSPITAL_COMMUNITY): Payer: Self-pay

## 2018-10-03 LAB — BASIC METABOLIC PANEL
Anion gap: 9 (ref 5–15)
BUN: 40 mg/dL — ABNORMAL HIGH (ref 8–23)
CO2: 39 mmol/L — ABNORMAL HIGH (ref 22–32)
Calcium: 8.2 mg/dL — ABNORMAL LOW (ref 8.9–10.3)
Chloride: 94 mmol/L — ABNORMAL LOW (ref 98–111)
Creatinine, Ser: 1.79 mg/dL — ABNORMAL HIGH (ref 0.44–1.00)
GFR calc Af Amer: 31 mL/min — ABNORMAL LOW (ref >60)
GFR calc non Af Amer: 26 mL/min — ABNORMAL LOW (ref >60)
Glucose, Bld: 63 mg/dL — ABNORMAL LOW (ref 70–99)
Potassium: 4 mmol/L (ref 3.5–5.1)
Sodium: 142 mmol/L (ref 135–145)

## 2018-10-03 LAB — GLUCOSE, CAPILLARY
Glucose-Capillary: 69 mg/dL — ABNORMAL LOW (ref 70–99)
Glucose-Capillary: 75 mg/dL (ref 70–99)
Glucose-Capillary: 70 mg/dL (ref 70–99)
Glucose-Capillary: 91 mg/dL (ref 70–99)

## 2018-10-03 LAB — HEMOGLOBIN A1C
Hgb A1c MFr Bld: 5.9 % — ABNORMAL HIGH (ref 4.8–5.6)
Mean Plasma Glucose: 122.63 mg/dL

## 2018-10-03 MED ORDER — FUROSEMIDE 10 MG/ML IJ SOLN
40.0000 mg | Freq: Two times a day (BID) | INTRAMUSCULAR | Status: DC
  Administered 2018-10-03 – 2018-10-04 (×2): 40 mg via INTRAVENOUS
  Filled 2018-10-03 (×2): qty 4

## 2018-10-03 NOTE — Progress Notes (Signed)
PROGRESS NOTE    Wanda [name]  NWG:[id number] DOB: 08-01-1940 DOA: 10/02/2018 PCP: Glenda Chroman, MD     Brief Narrative:  78 y.o. female with past medical history significant for chronic respiratory failure with hypoxia (using 2-4 L nasal cannula), COPD, hypertension, hyperlipidemia, gastroesophageal reflux disease, type 2 diabetes with nephropathy, chronic kidney disease is stage III, chronic paroxysmal atrial fibrillation (chronically on Eliquis), chronic diastolic heart failure and pulmonary hypertension; who presented to the emergency department secondary to increase shortness of breath lower extremity swelling.  Symptoms have been present for the last 3-4 days and worsening.  Patient reports that she has not been using any Lasix or closely following her weight on daily basis of previous instructed.  No chest pain, no nausea, no vomiting, no dysuria, no hematuria, no melena, no hematochezia, no headaches, no focal weakness, no fever, no chills, no sick contacts.  COVID test negative.  Chest x-ray demonstrating vascular congestion with acute pulmonary edema, elevated BNP, normal WBCs stable renal function.  Patient respiratory rate was in the mid 30s and she was saturating in the low 80s on 4 L nasal cannula; IV Lasix and BiPAP were provided as part of treatment in the ED.  [facility] contacted to admit patient for further evaluation and management of CHF exacerbation.   Assessment & Plan: 1-acute on chronic diastolic heart failure -Recent echo (January 2020) preserved ejection fraction 65 to 70%, indefinite parameters by Doppler studies for diastolic dysfunction at that time.  Patient with chronic atrial fibrillation suggesting diastolic abnormality. -Also with underlying history of pulmonary hypertension and COPD. -Continue daily weights -Continue I's and O's low-sodium diet -Continue IV diuresis -Excellent urine output since treatment initiation -Closely follow electrolytes and renal  function -Will adjust Lasix to 40 mg twice a day and stop metolazone at this moment. -Patient with BNP above 600 and vascular congestion on chest x-ray at time of admission.  2-acute on chronic respiratory failure with hypoxia -In the setting of acute on chronic diastolic CHF exacerbation; also with underlying history of pulmonary hypertension and COPD -Continue treatment with IV diuresis as mentioned above -Continue oxygen supplementation and titrate down to her baseline as tolerated (2-4 L nasal cannula). -Continue PRN nebulizer treatment for her COPD and continue the use of hydralazine to assist with pulmonary hypertension management along with diuresis as mentioned above.  3-HLD -Continue statins  4-essential hypertension -Stable and well-controlled so far -Continue current antihypertensive regimen -Follow vital signs.  5-chronic paroxysmal atrial fibrillation/long-term use of anticoagulation -Rate control -Continue Cardizem and carvedilol -Continue Eliquis.  6-type 2 diabetes with nephropathy -Continue holding oral hypoglycemic agents while inpatient-we will use sliding scale insulin and low-dose Levemir -Follow A1c results.  7-GERD -Continue PPI  8-chronic kidney disease a stage III -So far creatinine appears to be at baseline -Continue to follow closely while acutely providing diuresis -Patient denies dysuria or complaints of urinary retention currently; she has a Foley catheter in place.  9-anxiety -Overall with a stable mood -Continue home dose of Xanax -Constant reorientation to prevent sundowning and hospital-acquired delirium.   DVT prophylaxis: Chronically on Eliquis. Code Status: DNR Family Communication: Son updated over the phone. Disposition Plan: Remains inpatient, continue IV diuresis, follow renal function closely, continue daily weights and strict I's and O's.  Consultants:   Heart failure attending curbside after the phone (Dr.  Haroldine Laws).  Procedures:   See below for x-ray reports  Antimicrobials:  Anti-infectives (From admission, onward)   Start     Dose/Rate Route  Frequency Ordered Stop   10/02/18 1045  azithromycin (ZITHROMAX) 500 mg in sodium chloride 0.9 % 250 mL IVPB  Status:  Discontinued     500 mg 250 mL/hr over 60 Minutes Intravenous  Once 10/02/18 1034 10/02/18 1101   10/02/18 1045  cefTRIAXone (ROCEPHIN) 1 g in sodium chloride 0.9 % 100 mL IVPB  Status:  Discontinued     1 g 200 mL/hr over 30 Minutes Intravenous  Once 10/02/18 1034 10/02/18 1101       Subjective: No chest pain, no palpitations, no nausea, no vomiting.  Reports breathing is improving.  Still with signs of fluid overload on exam and with crackles audible on auscultation. Patient is using 4-5L [place] supplementation.  Objective: Vitals:   10/02/18 2121 10/02/18 2123 10/03/18 0504 10/03/18 1414  BP:  (!) 101/56 (!) 110/50 120/70  Pulse: 86  91 100  Resp: _0 Temp: 98.9 F (37.2 C)  98.4 F (36.9 C) 98.9 F (37.2 C)  TempSrc: Oral  Oral Oral  SpO2: 91%  91% 95%  Weight:   97 kg   Height:        Intake/Output Summary (Last 24 hours) at 10/03/2018 1644 Last data filed at 10/03/2018 1200 Gross per 24 hour  Intake 720 ml  Output 6300 ml  Net -5580 ml   Filed Weights   10/02/18 0649 10/02/18 1725 10/03/18 0504  Weight: 102.1 kg 100.8 kg 97 kg    Examination: General exam: Alert, awake, oriented x 3, reports improvement in her breathing, no chest pain, no nausea vomiting.  No palpitations.  Patient is afebrile.  Requiring 4-5 L nasal cannula supplementation. Respiratory system: Positive rhonchi, fine crackles at the bases, no using accessory muscles. Cardiovascular system:Rate controlled. No rubs or gallops. No JVD on exam. Gastrointestinal system: Abdomen is nondistended, soft and nontender. No organomegaly or masses felt. Normal bowel sounds heard. Central nervous system: Alert and oriented. No focal neurological  deficits. Extremities: No cyanosis or clubbing; trace to 1+ edema appreciated bilaterally Skin: Extensive bruise on her right lower extremity extending from mid shin all the way to her thighs and buttock (lateral aspect) Psychiatry: Judgement and insight appear normal. Mood & affect appropriate.     Data Reviewed: I have personally reviewed following labs and imaging studies  CBC: Recent Labs  Lab 10/02/18 0742  WBC 9.4  NEUTROABS 7.4  HGB 9.0*  HCT 30.8*  MCV 91.1  PLT 872   Basic Metabolic Panel: Recent Labs  Lab 10/02/18 0742 10/02/18 1030 10/03/18 0640  NA 138  --  142  K 5.0  --  4.0  CL 100  --  94*  CO2 35*  --  39*  GLUCOSE 151*  --  63*  BUN 30*  --  40*  CREATININE 1.54*  --  1.79*  CALCIUM 8.3*  --  8.2*  MG  --  2.1  --    GFR: Estimated Creatinine Clearance: 31 mL/min (A) (by C-G formula based on SCr of 1.79 mg/dL (H)).   Liver Function Tests: Recent Labs  Lab 10/02/18 0742  AST 13*  ALT 14  ALKPHOS 78  BILITOT 1.8*  PROT 6.3*  ALBUMIN 3.4*   CBG: Recent Labs  Lab 10/02/18 1731 10/02/18 2118 10/03/18 0719 10/03/18 1147  GLUCAP 134* 86 69* 75   Thyroid Function Tests: Recent Labs    10/02/18 1030  TSH 0.380    Recent Results (from the past 240 hour(s))  SARS Coronavirus 2 [facility]  order, Performed in [facility] hospital lab) Nasopharyngeal Nasopharyngeal Swab     Status: None   Collection Time: 10/02/18  7:43 AM   Specimen: Nasopharyngeal Swab  Result Value Ref Range Status   SARS Coronavirus 2 NEGATIVE NEGATIVE Final    Comment: (NOTE) If result is NEGATIVE SARS-CoV-2 target nucleic acids are NOT DETECTED. The SARS-CoV-2 RNA is generally detectable in upper and lower  respiratory specimens during the acute phase of infection. The lowest  concentration of SARS-CoV-2 viral copies this assay can detect is 250  copies / mL. A negative result does not preclude SARS-CoV-2 infection  and should not be used as the sole basis for  treatment or other  patient management decisions.  A negative result may occur with  improper specimen collection / handling, submission of specimen other  than nasopharyngeal swab, presence of viral mutation(s) within the  areas targeted by this assay, and inadequate number of viral copies  (<250 copies / mL). A negative result must be combined with clinical  observations, patient history, and epidemiological information. If result is POSITIVE SARS-CoV-2 target nucleic acids are DETECTED. The SARS-CoV-2 RNA is generally detectable in upper and lower  respiratory specimens dur ing the acute phase of infection.  Positive  results are indicative of active infection with SARS-CoV-2.  Clinical  correlation with patient history and other diagnostic information is  necessary to determine patient infection status.  Positive results do  not rule out bacterial infection or co-infection with other viruses. If result is PRESUMPTIVE POSTIVE SARS-CoV-2 nucleic acids MAY BE PRESENT.   A presumptive positive result was obtained on the submitted specimen  and confirmed on repeat testing.  While 2019 novel coronavirus  (SARS-CoV-2) nucleic acids may be present in the submitted sample  additional confirmatory testing may be necessary for epidemiological  and / or clinical management purposes  to differentiate between  SARS-CoV-2 and other Sarbecovirus currently known to infect humans.  If clinically indicated additional testing with an alternate test  methodology [phone]) is advised. The SARS-CoV-2 RNA is generally  detectable in upper and lower respiratory sp ecimens during the acute  phase of infection. The expected result is Negative. Fact Sheet for Patients:  StrictlyIdeas.no Fact Sheet for Healthcare Providers: BankingDealers.co.za This test is not yet approved or cleared by the [place] FDA and has been authorized for detection and/or  diagnosis of SARS-CoV-2 by FDA under an Emergency Use Authorization (EUA).  This EUA will remain in effect (meaning this test can be used) for the duration of the COVID-19 declaration under Section 564(b)(1) of the Act, 21 U.S.C. section 360bbb-3(b)(1), unless the authorization is terminated or revoked sooner. Performed at [facility], [address] [place], [place] [address]       Radiology Studies: Dg Chest [facility] 1 View  Result Date: 10/02/2018 CLINICAL DATA:  Patient with shortness of breath for 2 days. EXAM: PORTABLE CHEST 1 VIEW COMPARISON:  Chest radiograph 04/15/2018 FINDINGS: Cardiomegaly. Tortuosity of the thoracic aorta. Monitoring leads overlie the patient. Diffuse bilateral interstitial pulmonary opacities. Small bilateral pleural effusions. No pneumothorax. Prominence of the right hilum. IMPRESSION: Cardiomegaly. Diffuse bilateral interstitial pulmonary opacities may represent pulmonary edema. Atypical infection or hemorrhage not excluded. Prominence of the right hilum. Recommend attention on short-term follow-up to exclude the possibility of underlying mass. Small bilateral pleural effusions. Electronically Signed   By: Lovey Newcomer M.D.   On: 10/02/2018 09:00    Scheduled Meds:  ALPRAZolam  1 mg Oral QHS   apixaban  5 mg Oral BID   atorvastatin  10 mg Oral QPM   carvedilol  25 mg Oral BID WC   diltiazem  240 mg Oral Daily   furosemide  40 mg Intravenous Q12H   gabapentin  100 mg Oral BID   hydrALAZINE  25 mg Oral Q8H   insulin aspart  0-9 Units Subcutaneous TID WC   insulin detemir  5 Units Subcutaneous QHS   sodium chloride flush  3 mL Intravenous Q12H   vitamin B-12  1,000 mcg Oral Daily   Continuous Infusions:  sodium chloride       LOS: 1 day    Time spent: 30 minutes.   Barton Dubois, MD [facility] Pager [phone]  10/03/2018, 4:44 PM

## 2018-10-04 LAB — BASIC METABOLIC PANEL
Anion gap: 10 (ref 5–15)
BUN: 40 mg/dL — ABNORMAL HIGH (ref 8–23)
CO2: 43 mmol/L — ABNORMAL HIGH (ref 22–32)
Calcium: 8.5 mg/dL — ABNORMAL LOW (ref 8.9–10.3)
Chloride: 87 mmol/L — ABNORMAL LOW (ref 98–111)
Creatinine, Ser: 1.73 mg/dL — ABNORMAL HIGH (ref 0.44–1.00)
GFR calc Af Amer: 32 mL/min — ABNORMAL LOW (ref >60)
GFR calc non Af Amer: 28 mL/min — ABNORMAL LOW (ref >60)
Glucose, Bld: 85 mg/dL (ref 70–99)
Potassium: 3.4 mmol/L — ABNORMAL LOW (ref 3.5–5.1)
Sodium: 140 mmol/L (ref 135–145)

## 2018-10-04 LAB — GLUCOSE, CAPILLARY
Glucose-Capillary: 99 mg/dL (ref 70–99)
Glucose-Capillary: 132 mg/dL — ABNORMAL HIGH (ref 70–99)

## 2018-10-04 MED ORDER — LISINOPRIL 10 MG PO TABS: 10.0000 mg | ORAL_TABLET | Freq: Every day | ORAL | Status: AC

## 2018-10-04 MED ORDER — FUROSEMIDE 20 MG PO TABS: 30.0000 mg | ORAL_TABLET | Freq: Every day | ORAL | 2 refills | Status: DC

## 2018-10-04 MED ORDER — POTASSIUM CHLORIDE CRYS ER 20 MEQ PO TBCR
40.0000 meq | EXTENDED_RELEASE_TABLET | Freq: Once | ORAL | Status: AC
  Administered 2018-10-04: 40 meq via ORAL
  Filled 2018-10-04: qty 2

## 2018-10-04 NOTE — Discharge Summary (Signed)
Physician Discharge Summary  Wanda [name] IWO:[id number] DOB: [birth year] DOA: 10/02/2018  PCP: Glenda Chroman, MD  Admit date: 10/02/2018 Discharge date: 10/04/2018  Time spent: 35 minutes  Recommendations for Outpatient Follow-up:  1. Repeat basic metabolic panel to follow electrolytes and renal function 2. Close follow-up to patient's volume with further adjustment to diuretics as needed.   Discharge Diagnoses:  Principal Problem:   Acute on chronic diastolic HF (heart failure) (HCC) Active Problems:   HLD (hyperlipidemia)   Essential hypertension, benign   Long term (current) use of anticoagulants   Atrial fibrillation (HCC)   Pulmonary hypertension (HCC)   Pulmonary edema   Type 2 diabetes with nephropathy (HCC)   Chronic kidney disease, stage III (moderate) (HCC)   Chronic respiratory failure with hypoxia (HCC)   GERD (gastroesophageal reflux disease)   COPD (chronic obstructive pulmonary disease) ([place])   Discharge Condition: Stable and improved.  Patient discharged home with instruction to follow-up with PCP and cardiology service as an outpatient.  Home health services have been arranged to facilitate transition of care (home health nursing home health PT).  Patient will also follow-up with urology service to further address chronic urinary retention and urethral disorder.  Diet recommendation: Heart healthy and modify carbohydrates diet  Filed Weights   10/02/18 1725 10/03/18 0504 10/04/18 0528  Weight: 100.8 kg 97 kg 93.6 kg    History of present illness:  78 y.o.femalewith past medical history significant for chronic respiratory failure with hypoxia (using 2-4 L nasal cannula), COPD, hypertension, hyperlipidemia, gastroesophageal reflux disease, type 2 diabetes with nephropathy, chronic kidney disease is stage III,chronic paroxysmal atrial fibrillation (chronically on Eliquis), chronic diastolic heart failure and pulmonary hypertension;who presented to the  emergency department secondary to increase shortness of breath lower extremity swelling. Symptoms have been present for the last 3-4 days and worsening. Patient reports that she has not been using any Lasix or closely following her weight on daily basis of previous instructed. No chest pain, no nausea, no vomiting, no dysuria, no hematuria, no melena, no hematochezia, no headaches, no focal weakness, no fever, no chills, no sick contacts.  COVIDtest negative. Chest x-ray demonstrating vascular congestion with acute pulmonary edema, elevated BNP, normal WBCs stable renal function. Patient respiratory rate was in the mid 30s and she was saturating in the low 80s on 4 L nasal cannula; IV Lasix and BiPAP were provided as part of treatment in the ED. [facility] contacted to admit patient for further evaluation and management of CHF exacerbation.   Hospital Course:  1-acute on chronic diastolic heart failure -Recent echo (January 2020) preserved ejection fraction 65 to 70%, indefinite parameters by Doppler studies for diastolic dysfunction at that time.  Patient with chronic atrial fibrillation suggesting diastolic abnormality. -Also with underlying history of pulmonary hypertension and COPD. -Patient with BNP above 600 and vascular congestion on chest x-ray at time of admission. -Excellent urine output since diuresis started. -Closely follow electrolytes and renal function -Will discharge on lasix 60m daily -Patient negative about 10 L during hospitalization; weight at time of discharge 94 kg. -Patient advised to follow low-sodium diet, to maintain adequate hydration and to check her weight on daily basis. -Home health nurse has been arranged to assist with further management of her heart failure condition.  2-acute on chronic respiratory failure with hypoxia -In the setting of acute on chronic diastolic CHF exacerbation; also with underlying history of pulmonary hypertension and COPD -Continue  oxygen supplementation, at discharge back to baseline (2-4 L nasal  cannula). -Continue PRN nebulizer treatment for her COPD and continue the use of hydralazine to assist with pulmonary hypertension management along with diuresis as mentioned above.  3-HLD -Continue statins  4-essential hypertension -Stable and well-controlled so far -Continue current antihypertensive regimen -Follow heart healthy diet.  5-chronic paroxysmal atrial fibrillation/long-term use of anticoagulation -Rate controlled -Continue Cardizem and carvedilol -Continue Eliquis for secondary prevention.  6-type 2 diabetes with nephropathy -Continue holding oral hypoglycemic agents while inpatient-we will use sliding scale insulin and low-dose Levemir -Follow A1c 5.9.  7-GERD -Continue PPI  8-chronic kidney disease a stage III/uretral disorder -So far creatinine appears to be at baseline -Continue to follow closely after patient being discharge on daily Lasix. -Foley catheter in place at time of discharge; outpatient follow-up with urology has been arranged..  9-anxiety -Overall with a stable mood -Continue home dose of Xanax -oriented X 3 at discharge.  10-physical deconditioning -Patient has been discharged with home health services to assess with further care, rehabilitation and conditioning.  Procedures: See below for x-ray reports.  Consultations:  Heart failure attending curbside over the phone (Dr. Haroldine Laws) on admission.  Discharge Exam: Vitals:   10/04/18 0825 10/04/18 1012  BP:    Pulse:    Resp:    Temp:    SpO2: 92% (!) 85%    General: Afebrile, no chest pain, no nausea, no vomiting.  Patient was oriented x3 and reports breathing back to baseline. Cardiovascular: Rate controlled, no JVD, no rubs, no gallops. Respiratory: Improved air movement bilaterally, no frank crackles, no wheezing, positive scattered rhonchi.  Normal respiratory effort. Abdomen: Soft, nontender,  nondistended, positive bowel sounds Extremities: No edema, no cyanosis; extensive right side bruise (which is healing) extending from her mid shin all the way to her thigh/buttocks lateral aspect.  Discharge Instructions   Discharge Instructions    (HEART FAILURE PATIENTS) Call MD:  Anytime you have any of the following symptoms: 1) 3 pound weight gain in 24 hours or 5 pounds in 1 week 2) shortness of breath, with or without a dry hacking cough 3) swelling in the hands, feet or stomach 4) if you have to sleep on extra pillows at night in order to breathe.   Complete by: As directed    Diet - low sodium heart healthy   Complete by: As directed    Discharge instructions   Complete by: As directed    Take medications as prescribed Follow low-sodium diet (2-2.5 g of sodium daily) Maintain adequate hydration Arrange follow-up with PCP in 10 days Check weight on daily basis     Allergies as of 10/04/2018      Reactions   Gluten Diarrhea   Hydromorphone Hcl Other (See Comments)   REACTION: lowers BP levels   Prednisone Other (See Comments)   DOES NOT LIKE SIDE EFFECTS, INTERACTION WITH WARFARIN      Medication List    TAKE these medications   ALPRAZolam 1 MG tablet Commonly known as: XANAX Take 1 mg by mouth at bedtime.   apixaban 5 MG Tabs tablet Commonly known as: Eliquis Take 1 tablet (5 mg total) by mouth 2 (two) times daily.   atorvastatin 10 MG tablet Commonly known as: LIPITOR Take 10 mg by mouth every evening.   carvedilol 25 MG tablet Commonly known as: COREG Take 25 mg by mouth 2 (two) times daily with a meal.   diltiazem 240 MG 24 hr capsule Commonly known as: CARDIZEM CD TAKE ONE CAPSULE BY MOUTH DAILY   furosemide  20 MG tablet Commonly known as: Lasix Take 1.5 tablets (30 mg total) by mouth daily. What changed:   how much to take  when to take this  reasons to take this   gabapentin 100 MG capsule Commonly known as: NEURONTIN Take 100 mg by mouth  2 (two) times daily.   glimepiride 2 MG tablet Commonly known as: AMARYL Take 2 mg by mouth every morning.   hydrALAZINE 25 MG tablet Commonly known as: APRESOLINE TAKE TWO (2) TABLETS BY MOUTH THREE TIMES DAILY.   lisinopril 10 MG tablet Commonly known as: ZESTRIL Take 1 tablet (10 mg total) by mouth daily. What changed: when to take this   trimethoprim 100 MG tablet Commonly known as: TRIMPEX Take 100 mg by mouth at bedtime.   vitamin B-12 1000 MCG tablet Commonly known as: CYANOCOBALAMIN Take 1,000 mcg by mouth daily.      Allergies  Allergen Reactions  . Gluten Diarrhea  . Hydromorphone Hcl Other (See Comments)    REACTION: lowers BP levels  . Prednisone Other (See Comments)    DOES NOT LIKE SIDE EFFECTS, INTERACTION WITH WARFARIN   Follow-up Information    Vyas, Dhruv B, MD. Schedule an appointment as soon as possible for a visit in 14 day(s).   Specialty: Internal Medicine Contact information: [place] [address] [phone]        Satira Sark, MD. Schedule an appointment as soon as possible for a visit in 10 day(s).   Specialty: Cardiology Contact information: [place] [address] [phone]        [facility] [place] [place] Follow up.   Why: office will contact you with appointment details, plan is for you to be seen in the enxt 10 days. Contact information: [address] [place] [phone]          The results of significant diagnostics from this hospitalization (including imaging, microbiology, ancillary and laboratory) are listed below for reference.    Significant Diagnostic Studies: Dg Chest Port 1 View  Result Date: 10/02/2018 CLINICAL DATA:  Patient with shortness of breath for 2 days. EXAM: PORTABLE CHEST 1 VIEW COMPARISON:  Chest radiograph 04/15/2018 FINDINGS: Cardiomegaly. Tortuosity of the thoracic aorta. Monitoring leads overlie the patient. Diffuse  bilateral interstitial pulmonary opacities. Small bilateral pleural effusions. No pneumothorax. Prominence of the right hilum. IMPRESSION: Cardiomegaly. Diffuse bilateral interstitial pulmonary opacities may represent pulmonary edema. Atypical infection or hemorrhage not excluded. Prominence of the right hilum. Recommend attention on short-term follow-up to exclude the possibility of underlying mass. Small bilateral pleural effusions. Electronically Signed   By: Lovey Newcomer M.D.   On: 10/02/2018 09:00    Microbiology: Recent Results (from the past 240 hour(s))  SARS Coronavirus 2 [facility] order, Performed in [facility] hospital lab) Nasopharyngeal Nasopharyngeal Swab     Status: None   Collection Time: 10/02/18  7:43 AM   Specimen: Nasopharyngeal Swab  Result Value Ref Range Status   SARS Coronavirus 2 NEGATIVE NEGATIVE Final    Comment: (NOTE) If result is NEGATIVE SARS-CoV-2 target nucleic acids are NOT DETECTED. The SARS-CoV-2 RNA is generally detectable in upper and lower  respiratory specimens during the acute phase of infection. The lowest  concentration of SARS-CoV-2 viral copies this assay can detect is 250  copies / mL. A negative result does not preclude SARS-CoV-2 infection  and should not be used as the sole basis for treatment or other  patient  management decisions.  A negative result may occur with  improper specimen collection / handling, submission of specimen other  than nasopharyngeal swab, presence of viral mutation(s) within the  areas targeted by this assay, and inadequate number of viral copies  (<250 copies / mL). A negative result must be combined with clinical  observations, patient history, and epidemiological information. If result is POSITIVE SARS-CoV-2 target nucleic acids are DETECTED. The SARS-CoV-2 RNA is generally detectable in upper and lower  respiratory specimens dur ing the acute phase of infection.  Positive  results are indicative of active  infection with SARS-CoV-2.  Clinical  correlation with patient history and other diagnostic information is  necessary to determine patient infection status.  Positive results do  not rule out bacterial infection or co-infection with other viruses. If result is PRESUMPTIVE POSTIVE SARS-CoV-2 nucleic acids MAY BE PRESENT.   A presumptive positive result was obtained on the submitted specimen  and confirmed on repeat testing.  While 2019 novel coronavirus  (SARS-CoV-2) nucleic acids may be present in the submitted sample  additional confirmatory testing may be necessary for epidemiological  and / or clinical management purposes  to differentiate between  SARS-CoV-2 and other Sarbecovirus currently known to infect humans.  If clinically indicated additional testing with an alternate test  methodology [phone]) is advised. The SARS-CoV-2 RNA is generally  detectable in upper and lower respiratory sp ecimens during the acute  phase of infection. The expected result is Negative. Fact Sheet for Patients:  StrictlyIdeas.no Fact Sheet for Healthcare Providers: BankingDealers.co.za This test is not yet approved or cleared by the [place] FDA and has been authorized for detection and/or diagnosis of SARS-CoV-2 by FDA under an Emergency Use Authorization (EUA).  This EUA will remain in effect (meaning this test can be used) for the duration of the COVID-19 declaration under Section 564(b)(1) of the Act, 21 U.S.C. section 360bbb-3(b)(1), unless the authorization is terminated or revoked sooner. Performed at [facility], [address] [place], [place] [address]      Labs: Basic Metabolic Panel: Recent Labs  Lab 10/02/18 [phone] 10/02/18 1030 10/03/18 0640 10/04/18 0448  NA 138  --  142 140  K 5.0  --  4.0 3.4*  CL 100  --  94* 87*  CO2 35*  --  39* 43*  GLUCOSE 151*  --  63* 85  BUN 30*  --  40* 40*  CREATININE 1.54*  --  1.79* 1.73*   CALCIUM 8.3*  --  8.2* 8.5*  MG  --  2.1  --   --    Liver Function Tests: Recent Labs  Lab 10/02/18 0742  AST 13*  ALT 14  ALKPHOS 78  BILITOT 1.8*  PROT 6.3*  ALBUMIN 3.4*   CBC: Recent Labs  Lab 10/02/18 0742  WBC 9.4  NEUTROABS 7.4  HGB 9.0*  HCT 30.8*  MCV 91.1  PLT 318   BNP (last 3 results) Recent Labs    10/02/18 0948  BNP 602.0*   CBG: Recent Labs  Lab 10/03/18 1147 10/03/18 1701 10/03/18 2157 10/04/18 0741 10/04/18 1118  GLUCAP 75 70 91 99 132*    Signed:  Barton Dubois MD.  [facility] 10/04/2018, 12:28 PM

## 2018-10-04 NOTE — Care Management Important Message (Signed)
Important Message  Patient Details  Name: Wanda [name] MRN: [id number] Date of Birth: 02-22-1940   Medicare Important Message Given:  Yes(given to nurse to place at bedside due to precautions)     Tommy Medal 10/04/2018, 10:09 AM

## 2018-10-04 NOTE — Clinical Social Work Note (Signed)
PT recommendation discussed with patient and son Larkin Ina. Patient does not drive and HHPT would work out best for patient. MD notified and orders placed. [place] choices provided to patient made referral to choice. Referral made to [facility] [place] for RN, PT.     Aryiana Klinkner, Clydene Pugh, LCSW

## 2018-10-04 NOTE — Plan of Care (Signed)
  Problem: Acute Rehab PT Goals(only PT should resolve) Goal: Pt Will Transfer Bed To Chair/Chair To Bed Flowsheets (Taken 10/04/2018 1021) Pt will Transfer Bed to Chair/Chair to Bed: with supervision Note: With RW Goal: Pt Will Ambulate Flowsheets (Taken 10/04/2018 1022) Pt will Ambulate:  25 feet  with min guard assist  with rolling walker Goal: Pt/caregiver will Perform Home Exercise Program Flowsheets (Taken 10/04/2018 1022) Pt/caregiver will Perform Home Exercise Program: For increased strengthening   10:23 AM, 10/04/18 Jerene Pitch, DPT Physical Therapy with [facility]  [phone] office

## 2018-10-04 NOTE — Evaluation (Signed)
Physical Therapy Evaluation Patient Details Name: Wanda [name] MRN: [id number] DOB: 1940-06-23 Today's Date: 10/04/2018   History of Present Illness  78 y.o. female with past medical history significant for chronic respiratory failure with hypoxia (using 2-4 L nasal cannula), COPD, hypertension, hyperlipidemia, gastroesophageal reflux disease, type 2 diabetes with nephropathy, chronic kidney disease is stage III, chronic paroxysmal atrial fibrillation (chronically on Eliquis), chronic diastolic heart failure and pulmonary hypertension; who presented to the emergency department secondary to increase shortness of breath lower extremity swelling.  Symptoms have been present for the last 3-4 days and worsening.  Patient reports that she has not been using any Lasix or closely following her weight on daily basis of previous instructed.  No chest pain, no nausea, no vomiting, no dysuria, no hematuria, no melena, no hematochezia, no headaches, no focal weakness, no fever, no chills, no sick contacts.    Clinical Impression  Patient very eager to participate in physical therapy as she has not been up since she arrived at the hospital. Patient able to perform all bed mobilities with modified independence, transfers (including toilet transfer) with supervision and ambulation with min guard assist. Patient is has tendency to scissor her feet with walking and push the walker further out in front of her with ambulation. Patient was not consistently using a RW prior to hospital admittance, but recommended to her to use it for safety until she gets stronger. Recent fall 4 days ago left patient with hematoma along right LE and patient wants to be as safe as possible with functional mobility. Vitals monitored throughout session, HR went into bradycardia with initial transition into standing upright twice during session. O2 saturation decreased with activity 90% (while on 4L O2),  and was 85% after activity was  completed, able to return to 96% with pursed lip breathing exercises. Patient has family at home that is able to assist her as needed throughout the day.  Patient will continue to benefit from skilled physical therapy in hospital and recommended venue below to increase strength, balance, endurance for safe ADLs and gait.    Follow Up Recommendations Outpatient PT    Equipment Recommendations       Recommendations for Other Services       Precautions / Restrictions Precautions Precautions: Fall Restrictions Weight Bearing Restrictions: No      Mobility  Bed Mobility Overal bed mobility: Modified Independent             General bed mobility comments: increased time  Transfers Overall transfer level: Needs assistance Equipment used: Rolling walker (2 wheeled) Transfers: Sit to/from Stand Sit to Stand: Supervision            Ambulation/Gait Ambulation/Gait assistance: Min guard Gait Distance (Feet): 10 Feet(walked 10 feet 2x during today's session) Assistive device: Rolling walker (2 wheeled) Gait Pattern/deviations: Narrow base of support;Scissoring;Decreased step length - right;Decreased step length - left;Trunk flexed(walks on lateral aspects of feet) Gait velocity: decreased   General Gait Details: scissoring gait, unaware - can't feel feet.  Stairs            Wheelchair Mobility    Modified Rankin (Stroke Patients Only)       Balance Overall balance assessment: Mild deficits observed, not formally tested                                           Pertinent Vitals/Pain  Pain Assessment: No/denies pain    Home Living Family/patient expects to be discharged to:: Private residence Living Arrangements: Children Available Help at Discharge: Family Type of Home: House Home Access: Level entry     Home Layout: One level(one step in home to kitchen from living room) Home Equipment: Kasandra Knudsen - single point;Walker - 2 wheels       Prior Function Level of Independence: Independent with assistive device(s)               Hand Dominance        Extremity/Trunk Assessment   Upper Extremity Assessment Upper Extremity Assessment: Overall WFL for tasks assessed    Lower Extremity Assessment Lower Extremity Assessment: Overall WFL for tasks assessed    Cervical / Trunk Assessment Cervical / Trunk Assessment: Normal  Communication   Communication: No difficulties  Cognition Arousal/Alertness: Awake/alert Behavior During Therapy: WFL for tasks assessed/performed Overall Cognitive Status: Within Functional Limits for tasks assessed                                        General Comments      Exercises     Assessment/Plan    PT Assessment Patient needs continued PT services  PT Problem List Decreased strength;Decreased mobility;Decreased activity tolerance;Decreased balance       PT Treatment Interventions Gait training;Therapeutic exercise;Balance training;Therapeutic activities;Neuromuscular re-education;Functional mobility training    PT Goals (Current goals can be found in the Care Plan section)  Acute Rehab PT Goals Patient Stated Goal: to get better PT Goal Formulation: With patient Time For Goal Achievement: 10/18/18 Potential to Achieve Goals: Good    Frequency Min 3X/week   Barriers to discharge   none    Co-evaluation               AM-PAC PT "6 Clicks" Mobility  Outcome Measure Help needed turning from your back to your side while in a flat bed without using bedrails?: None Help needed moving from lying on your back to sitting on the side of a flat bed without using bedrails?: None Help needed moving to and from a bed to a chair (including a wheelchair)?: A Little Help needed standing up from a chair using your arms (e.g., wheelchair or bedside chair)?: A Little Help needed to walk in hospital room?: A Little Help needed climbing 3-5 steps with a  railing? : A Lot 6 Click Score: 19    End of Session Equipment Utilized During Treatment: Gait belt Activity Tolerance: Patient limited by fatigue;Patient tolerated treatment well Patient left: in chair;with call bell/phone within reach Nurse Communication: Mobility status;Precautions PT Visit Diagnosis: Unsteadiness on feet (R26.81);Other abnormalities of gait and mobility (R26.89);History of falling (Z91.81);Difficulty in walking, not elsewhere classified (R26.2)    Time: 0911-1000 PT Time Calculation (min) (ACUTE ONLY): 49 min   Charges:   PT Evaluation $PT Eval Moderate Complexity: 1 Mod PT Treatments $Gait Training: 8-22 mins $Therapeutic Activity: 8-22 mins       10:19 AM, 10/04/18 Jerene Pitch, DPT Physical Therapy with [facility]  [phone] office

## 2018-10-07 ENCOUNTER — Other Ambulatory Visit (HOSPITAL_COMMUNITY): Payer: Self-pay | Admitting: Internal Medicine

## 2018-10-12 DIAGNOSIS — N184 Chronic kidney disease, stage 4 (severe): Secondary | ICD-10-CM | POA: Diagnosis not present

## 2018-10-12 DIAGNOSIS — R3914 Feeling of incomplete bladder emptying: Secondary | ICD-10-CM | POA: Diagnosis not present

## 2018-10-14 DIAGNOSIS — I509 Heart failure, unspecified: Secondary | ICD-10-CM | POA: Diagnosis not present

## 2018-10-14 DIAGNOSIS — I10 Essential (primary) hypertension: Secondary | ICD-10-CM | POA: Diagnosis not present

## 2018-10-14 DIAGNOSIS — E1165 Type 2 diabetes mellitus with hyperglycemia: Secondary | ICD-10-CM | POA: Diagnosis not present

## 2018-10-14 DIAGNOSIS — E1122 Type 2 diabetes mellitus with diabetic chronic kidney disease: Secondary | ICD-10-CM | POA: Diagnosis not present

## 2018-10-14 DIAGNOSIS — Z6831 Body mass index (BMI) 31.0-31.9, adult: Secondary | ICD-10-CM | POA: Diagnosis not present

## 2018-10-14 DIAGNOSIS — Z299 Encounter for prophylactic measures, unspecified: Secondary | ICD-10-CM | POA: Diagnosis not present

## 2018-10-14 DIAGNOSIS — N184 Chronic kidney disease, stage 4 (severe): Secondary | ICD-10-CM | POA: Diagnosis not present

## 2018-10-20 ENCOUNTER — Other Ambulatory Visit (HOSPITAL_COMMUNITY): Payer: Self-pay | Admitting: Internal Medicine

## 2018-10-25 ENCOUNTER — Encounter (HOSPITAL_COMMUNITY): Payer: PPO | Admitting: Internal Medicine

## 2018-10-25 DIAGNOSIS — R339 Retention of urine, unspecified: Secondary | ICD-10-CM | POA: Diagnosis not present

## 2018-10-26 DIAGNOSIS — J449 Chronic obstructive pulmonary disease, unspecified: Secondary | ICD-10-CM | POA: Diagnosis not present

## 2018-10-26 DIAGNOSIS — I10 Essential (primary) hypertension: Secondary | ICD-10-CM | POA: Diagnosis not present

## 2018-10-26 DIAGNOSIS — Z6831 Body mass index (BMI) 31.0-31.9, adult: Secondary | ICD-10-CM | POA: Diagnosis not present

## 2018-10-26 DIAGNOSIS — R413 Other amnesia: Secondary | ICD-10-CM | POA: Diagnosis not present

## 2018-10-26 DIAGNOSIS — Z299 Encounter for prophylactic measures, unspecified: Secondary | ICD-10-CM | POA: Diagnosis not present

## 2018-10-26 DIAGNOSIS — I270 Primary pulmonary hypertension: Secondary | ICD-10-CM | POA: Diagnosis not present

## 2018-10-27 ENCOUNTER — Encounter: Payer: Self-pay | Admitting: Cardiology

## 2018-10-27 ENCOUNTER — Ambulatory Visit (INDEPENDENT_AMBULATORY_CARE_PROVIDER_SITE_OTHER): Payer: PPO | Admitting: Cardiology

## 2018-10-27 ENCOUNTER — Other Ambulatory Visit: Payer: Self-pay

## 2018-10-27 VITALS — BP 102/58 | HR 69 | Temp 98.9°F | Ht 68.0 in | Wt 207.0 lb

## 2018-10-27 DIAGNOSIS — N183 Chronic kidney disease, stage 3 unspecified: Secondary | ICD-10-CM

## 2018-10-27 DIAGNOSIS — I272 Pulmonary hypertension, unspecified: Secondary | ICD-10-CM | POA: Diagnosis not present

## 2018-10-27 DIAGNOSIS — I5032 Chronic diastolic (congestive) heart failure: Secondary | ICD-10-CM | POA: Diagnosis not present

## 2018-10-27 DIAGNOSIS — I4821 Permanent atrial fibrillation: Secondary | ICD-10-CM | POA: Diagnosis not present

## 2018-10-27 NOTE — Progress Notes (Signed)
Cardiology Office Note  Date: 10/27/2018   ID: KRYSTALE RINKENBERGER, DOB 1940/04/21, MRN [id number]  PCP:  Glenda Chroman, MD  Cardiologist:  Rozann Lesches, MD Electrophysiologist:  None   Chief Complaint  Patient presents with  . Hospitalization Follow-up    History of Present Illness: Wanda [name] is a 78 y.o. female that I last saw in July.  She was seen in the interim by Dr. Haroldine Laws in August for follow-up of diastolic heart failure and pulmonary hypertension, likely group 3.  She was hospitalized in late August with acute on chronic diastolic heart failure complicated by hypoxic respiratory failure.  She improved with IV diuresis of approximately 10 L and was discharged on Lasix 30 mg daily by the hospitalist service.  Cardiology was not consulted.  She is here today with her son for a follow-up visit.  She tells me that she feels much better overall.  She is now living with family members and has good assistance.  She has been having some memory problems.  I reviewed her medications.  Her weight has fluctuated by only a pound or two.  She uses a urinary catheter regularly and also follows with urology.  Creatinine was 1.73 in late August, no follow-up as yet.  Past Medical History:  Diagnosis Date  . B12 deficiency   . Celiac disease   . COPD (chronic obstructive pulmonary disease) ([place])   . Essential hypertension   . Goiter   . History of breast cancer    Status post mastectomy  . History of cardiac catheterization    No significant CAD 2006  . Mixed hyperlipidemia   . Neuropathy   . Paroxysmal atrial fibrillation (HCC)    Coumadin therapy  . PUD (peptic ulcer disease)   . Pulmonary hypertension ([place])   . Type 2 diabetes mellitus ([place])     Past Surgical History:  Procedure Laterality Date  . DILATION AND CURETTAGE OF UTERUS    . HYSTEROSCOPY W/ ENDOMETRIAL ABLATION     ThermaChoice  . MASTECTOMY      Current Outpatient Medications  Medication Sig  Dispense Refill  . ALPRAZolam (XANAX) 1 MG tablet Take 1 mg by mouth at bedtime.     Marland Kitchen apixaban (ELIQUIS) 5 MG TABS tablet Take 1 tablet (5 mg total) by mouth 2 (two) times daily. 60 tablet 11  . atorvastatin (LIPITOR) 10 MG tablet Take 10 mg by mouth every evening.     . carvedilol (COREG) 25 MG tablet Take 25 mg by mouth 2 (two) times daily with a meal.     . diltiazem (CARDIZEM CD) 240 MG 24 hr capsule TAKE ONE CAPSULE BY MOUTH DAILY 90 capsule 1  . furosemide (LASIX) 20 MG tablet Take 1.5 tablets (30 mg total) by mouth daily. 45 tablet 2  . gabapentin (NEURONTIN) 100 MG capsule Take 100 mg by mouth 3 (three) times daily.     Marland Kitchen glimepiride (AMARYL) 1 MG tablet Take 1 mg by mouth daily with breakfast.    . hydrALAZINE (APRESOLINE) 25 MG tablet TAKE TWO (2) TABLETS BY MOUTH THREE TIMES DAILY. 180 tablet 3  . lisinopril (ZESTRIL) 10 MG tablet Take 1 tablet (10 mg total) by mouth daily.    Marland Kitchen trimethoprim (TRIMPEX) 100 MG tablet Take 100 mg by mouth at bedtime.    . vitamin B-12 (CYANOCOBALAMIN) 1000 MCG tablet Take 1,000 mcg by mouth daily.     No current facility-administered medications for this visit.  Allergies:  Gluten, Hydromorphone hcl, and Prednisone   Social History: The patient  reports that she quit smoking about 23 years ago. Her smoking use included cigarettes. She has never used smokeless tobacco. She reports current alcohol use. She reports that she does not use drugs.   ROS:  Please see the history of present illness. Otherwise, complete review of systems is positive for hearing and memory loss.  All other systems are reviewed and negative.   Physical Exam: VS:  BP (!) 102/58   Pulse 69   Temp 98.9 F (37.2 C)   Ht _0  (1.727 m)   Wt 207 lb (93.9 kg)   SpO2 98%   BMI 31.47 kg/m , BMI Body mass index is 31.47 kg/m.  Wt Readings from Last 3 Encounters:  10/27/18 207 lb (93.9 kg)  10/04/18 206 lb 5.6 oz (93.6 kg)  09/15/18 217 lb 3.2 oz (98.5 kg)    General:  Elderly woman wearing oxygen via nasal cannula, appears comfortable at rest. HEENT: Conjunctiva and lids normal, wearing a mask. Neck: Supple, no elevated JVP or carotid bruits, no thyromegaly. Lungs: Clear to auscultation, nonlabored breathing at rest. Cardiac: Irregularly irregular, no gallop. Abdomen: Soft, nontender, bowel sounds present. Extremities: Mild ankle edema. Skin: Warm and dry. Musculoskeletal: No kyphosis. Neuropsychiatric: Alert and oriented x3, affect grossly appropriate.  ECG:  An ECG dated 10/02/2018 was personally reviewed today and demonstrated:  Rate controlled atrial fibrillation with low voltage.  Recent Labwork: 10/02/2018: ALT 14; AST 13; B Natriuretic Peptide 602.0; Hemoglobin 9.0; Magnesium 2.1; Platelets 318; TSH 0.380 10/04/2018: BUN 40; Creatinine, Ser 1.73; Potassium 3.4; Sodium 140   Other Studies Reviewed Today:  Echocardiogram 03/08/2018: Study Conclusions  - Left ventricle: The cavity size was normal. Wall thickness was   increased in a pattern of mild LVH. Systolic function was   vigorous. The estimated ejection fraction was in the range of 65%   to 70%. Wall motion was normal; there were no regional wall   motion abnormalities. The study is not technically sufficient to   allow evaluation of LV diastolic function. - Aortic valve: Moderately calcified annulus. Moderately thickened   leaflets. There was mild stenosis. Mean gradient (S): 13 mm Hg.   Valve area (VTI): 1.61 cm^2. Valve area (Vmax): 1.74 cm^2. Valve   area (Vmean): 1.74 cm^2. - Mitral valve: There was mild regurgitation. - Left atrium: The atrium was severely dilated. - Right ventricle: The cavity size was mildly dilated. - Right atrium: The atrium was severely dilated. - Technically difficult study.  Assessment and Plan:  1.  Chronic diastolic heart failure, currently stable in terms of volume status.  She is on Lasix at 30 mg daily following recent hospital discharge.  Follow-up  BMET.  She is tracking weights daily and has good assistance from family members.  2.  Permanent atrial fibrillation.  She continues on Eliquis for stroke prophylaxis and remains on Cardizem CD for heart rate control.  3.  Pulmonary hypertension, likely group 3.  She follows with Dr. Haroldine Laws and remains on supplemental oxygen.  4.  CKD stage III, last creatinine 1.73.  Follow-up BMET.  Medication Adjustments/Labs and Tests Ordered: Current medicines are reviewed at length with the patient today.  Concerns regarding medicines are outlined above.   Tests Ordered: Orders Placed This Encounter  Procedures  . Basic metabolic panel    Medication Changes: No orders of the defined types were placed in this encounter.   Disposition:  Follow up 6  to 8 weeks in the [place] office.  Signed, Wanda Sark, MD, [facility] 10/27/2018 4:27 PM    [facility], [place], [place] [address] Phone: [phone]; Fax: [phone]

## 2018-10-27 NOTE — Patient Instructions (Addendum)
Medication Instructions:    Your physician recommends that you continue on your current medications as directed. Please refer to the Current Medication list given to you today.  Labwork:  Your physician recommends that you return for non- fasting lab work in: as soon as possible to check your BMET. Please go to [facility] or [facility] in [place].   Testing/Procedures:  NONE  Follow-Up:  Your physician recommends that you schedule a follow-up appointment in: 6-8 weeks.  Any Other Special Instructions Will Be Listed Below (If Applicable).  If you need a refill on your cardiac medications before your next appointment, please call your pharmacy.

## 2018-10-28 ENCOUNTER — Other Ambulatory Visit (HOSPITAL_COMMUNITY)
Admission: RE | Admit: 2018-10-28 | Discharge: 2018-10-28 | Disposition: A | Discharge status: Home / Self Care | Payer: PPO | Source: Ambulatory Visit | Attending: Cardiology | Admitting: Cardiology

## 2018-10-28 DIAGNOSIS — I5032 Chronic diastolic (congestive) heart failure: Secondary | ICD-10-CM | POA: Diagnosis not present

## 2018-10-28 LAB — BASIC METABOLIC PANEL
Anion gap: 10 (ref 5–15)
BUN: 29 mg/dL — ABNORMAL HIGH (ref 8–23)
CO2: 30 mmol/L (ref 22–32)
Calcium: 8.7 mg/dL — ABNORMAL LOW (ref 8.9–10.3)
Chloride: 98 mmol/L (ref 98–111)
Creatinine, Ser: 2.05 mg/dL — ABNORMAL HIGH (ref 0.44–1.00)
GFR calc Af Amer: 26 mL/min — ABNORMAL LOW (ref >60)
GFR calc non Af Amer: 22 mL/min — ABNORMAL LOW (ref >60)
Glucose, Bld: 106 mg/dL — ABNORMAL HIGH (ref 70–99)
Potassium: 4 mmol/L (ref 3.5–5.1)
Sodium: 138 mmol/L (ref 135–145)

## 2018-10-29 ENCOUNTER — Telehealth: Payer: Self-pay | Admitting: *Deleted

## 2018-10-29 ENCOUNTER — Telehealth: Payer: Self-pay | Admitting: Cardiology

## 2018-10-29 DIAGNOSIS — I5032 Chronic diastolic (congestive) heart failure: Secondary | ICD-10-CM

## 2018-10-29 DIAGNOSIS — N183 Chronic kidney disease, stage 3 unspecified: Secondary | ICD-10-CM

## 2018-10-29 MED ORDER — FUROSEMIDE 20 MG PO TABS: 20.0000 mg | ORAL_TABLET | Freq: Every day | ORAL | 2 refills | Status: DC

## 2018-10-29 NOTE — Telephone Encounter (Signed)
Wanda [name] called in regards to her recent labs. Wanted to know if we had results.

## 2018-10-29 NOTE — Telephone Encounter (Signed)
Patient informed. Copy sent to PCP- Lab order faxed to [place]

## 2018-10-29 NOTE — Telephone Encounter (Signed)
See previous phone note for details

## 2018-10-29 NOTE — Telephone Encounter (Signed)
-----  Message from Satira Sark, MD sent at 10/28/2018  4:26 PM EDT ----- Results reviewed.  Let Wanda [name] and her son know that creatinine has increased by the recent blood work, now up to 2.05.  Potassium is normal. I would suggest that we cut Lasix back to 20 mg daily. Please recheck BMET for next visit.

## 2018-11-02 ENCOUNTER — Other Ambulatory Visit: Payer: Self-pay | Admitting: Cardiology

## 2018-11-02 DIAGNOSIS — I445 Left posterior fascicular block: Secondary | ICD-10-CM | POA: Diagnosis not present

## 2018-11-02 DIAGNOSIS — R0902 Hypoxemia: Secondary | ICD-10-CM | POA: Diagnosis not present

## 2018-11-03 ENCOUNTER — Telehealth: Payer: Self-pay | Admitting: Cardiology

## 2018-11-03 MED ORDER — APIXABAN 5 MG PO TABS: 5.0000 mg | ORAL_TABLET | Freq: Two times a day (BID) | ORAL | 0 refills | Status: DC

## 2018-11-03 NOTE — Telephone Encounter (Signed)
Patient calling the office for samples of medication:   1.  What medication and dosage are you requesting samples for? Eliquis  2.  Are you currently out of this medication?

## 2018-11-03 NOTE — Telephone Encounter (Signed)
Pt will have someone come by office to pick up samples

## 2018-11-15 DIAGNOSIS — R339 Retention of urine, unspecified: Secondary | ICD-10-CM | POA: Diagnosis not present

## 2018-11-18 DIAGNOSIS — N184 Chronic kidney disease, stage 4 (severe): Secondary | ICD-10-CM | POA: Diagnosis not present

## 2018-11-18 DIAGNOSIS — R3914 Feeling of incomplete bladder emptying: Secondary | ICD-10-CM | POA: Diagnosis not present

## 2018-12-02 DIAGNOSIS — I445 Left posterior fascicular block: Secondary | ICD-10-CM | POA: Diagnosis not present

## 2018-12-02 DIAGNOSIS — R0902 Hypoxemia: Secondary | ICD-10-CM | POA: Diagnosis not present

## 2018-12-06 ENCOUNTER — Other Ambulatory Visit: Payer: Self-pay | Admitting: Cardiology

## 2018-12-08 ENCOUNTER — Other Ambulatory Visit (HOSPITAL_COMMUNITY)
Admission: RE | Admit: 2018-12-08 | Discharge: 2018-12-08 | Disposition: A | Discharge status: Home / Self Care | Payer: PPO | Source: Ambulatory Visit | Attending: Cardiology | Admitting: Cardiology

## 2018-12-08 DIAGNOSIS — N183 Chronic kidney disease, stage 3 unspecified: Secondary | ICD-10-CM | POA: Insufficient documentation

## 2018-12-08 LAB — BASIC METABOLIC PANEL
Anion gap: 10 (ref 5–15)
BUN: 25 mg/dL — ABNORMAL HIGH (ref 8–23)
CO2: 28 mmol/L (ref 22–32)
Calcium: 9 mg/dL (ref 8.9–10.3)
Chloride: 101 mmol/L (ref 98–111)
Creatinine, Ser: 1.85 mg/dL — ABNORMAL HIGH (ref 0.44–1.00)
GFR calc Af Amer: 29 mL/min — ABNORMAL LOW (ref >60)
GFR calc non Af Amer: 25 mL/min — ABNORMAL LOW (ref >60)
Glucose, Bld: 89 mg/dL (ref 70–99)
Potassium: 4 mmol/L (ref 3.5–5.1)
Sodium: 139 mmol/L (ref 135–145)

## 2018-12-09 ENCOUNTER — Encounter: Payer: Self-pay | Admitting: Cardiology

## 2018-12-09 ENCOUNTER — Telehealth (INDEPENDENT_AMBULATORY_CARE_PROVIDER_SITE_OTHER): Payer: PPO | Admitting: Cardiology

## 2018-12-09 DIAGNOSIS — I5032 Chronic diastolic (congestive) heart failure: Secondary | ICD-10-CM | POA: Diagnosis not present

## 2018-12-09 DIAGNOSIS — N1832 Chronic kidney disease, stage 3b: Secondary | ICD-10-CM

## 2018-12-09 DIAGNOSIS — I272 Pulmonary hypertension, unspecified: Secondary | ICD-10-CM | POA: Diagnosis not present

## 2018-12-09 DIAGNOSIS — I4821 Permanent atrial fibrillation: Secondary | ICD-10-CM

## 2018-12-09 NOTE — Patient Instructions (Addendum)
Medication Instructions:   Your physician recommends that you continue on your current medications as directed. Please refer to the Current Medication list given to you today.  Labwork:  Your physician recommends that you return for non-fasting lab work just before your December 4th appointment to check your BMET. You may have this done at [facility]. Your lab order has been faxed to there lab.  Testing/Procedures:  NONE  Follow-Up:  Your physician recommends that you schedule a follow-up appointment in: as planned on January 14, 2019 _0 :00 am.  Any Other Special Instructions Will Be Listed Below (If Applicable).  If you need a refill on your cardiac medications before your next appointment, please call your pharmacy.

## 2018-12-09 NOTE — Progress Notes (Signed)
Virtual Visit via Telephone Note   This visit type was conducted due to national recommendations for restrictions regarding the COVID-19 Pandemic (e.g. social distancing) in an effort to limit this patient's exposure and mitigate transmission in our community.  Due to her co-morbid illnesses, this patient is at least at moderate risk for complications without adequate follow up.  This format is felt to be most appropriate for this patient at this time.  The patient did not have access to video technology/had technical difficulties with video requiring transitioning to audio format only (telephone).  All issues noted in this document were discussed and addressed.  No physical exam could be performed with this format.  Please refer to the patient's chart for her  consent to telehealth for [facility].   Date:  12/09/2018   ID:  Wanda [name], DOB 11-Jul-1940, MRN [id number]  Patient Location: Home Provider Location: Office  PCP:  Glenda Chroman, MD  Cardiologist:  Rozann Lesches, MD Electrophysiologist:  None   Evaluation Performed:  Follow-Up Visit  Chief Complaint:   Cardiac follow-up   History of Present Illness:    Wanda [name] is a 78 y.o. female last seen in September.  We spoke by phone today.  She tells me that she has been doing relatively well.  Her weight is down about 5 pounds.  She does not report any progressive shortness of breath, no palpitations or syncope.  We cut her Lasix back to 20 mg daily since last visit.  Follow-up lab work shows improvement in creatinine to 1.85 with normal potassium.  Otherwise there have been no changes in her medications as outlined below.  The patient does not have symptoms concerning for COVID-19 infection (fever, chills, cough, or new shortness of breath).  She has been staying at home.   Past Medical History:  Diagnosis Date  . B12 deficiency   . Celiac disease   . COPD (chronic obstructive pulmonary disease) ([place])   .  Essential hypertension   . Goiter   . History of breast cancer    Status post mastectomy  . History of cardiac catheterization    No significant CAD 2006  . Mixed hyperlipidemia   . Neuropathy   . Paroxysmal atrial fibrillation (HCC)    Coumadin therapy  . PUD (peptic ulcer disease)   . Pulmonary hypertension ([place])   . Type 2 diabetes mellitus ([place])    Past Surgical History:  Procedure Laterality Date  . DILATION AND CURETTAGE OF UTERUS    . HYSTEROSCOPY W/ ENDOMETRIAL ABLATION     ThermaChoice  . MASTECTOMY       Current Meds  Medication Sig  . ALPRAZolam (XANAX) 1 MG tablet Take 1 mg by mouth at bedtime.   Marland Kitchen apixaban (ELIQUIS) 5 MG TABS tablet Take 1 tablet (5 mg total) by mouth 2 (two) times daily.  Marland Kitchen atorvastatin (LIPITOR) 10 MG tablet Take 10 mg by mouth every evening.   . carvedilol (COREG) 25 MG tablet Take 25 mg by mouth 2 (two) times daily with a meal.   . diltiazem (CARDIZEM CD) 240 MG 24 hr capsule TAKE ONE CAPSULE BY MOUTH DAILY  . furosemide (LASIX) 20 MG tablet TAKE ONE TABLET BY MOUTH EVERY DAY  . gabapentin (NEURONTIN) 100 MG capsule Take 100 mg by mouth 3 (three) times daily.   Marland Kitchen glimepiride (AMARYL) 1 MG tablet Take 1 mg by mouth daily with breakfast.  . hydrALAZINE (APRESOLINE) 25 MG tablet TAKE TWO (2) TABLETS  BY MOUTH THREE TIMES DAILY.  Marland Kitchen lisinopril (ZESTRIL) 10 MG tablet Take 1 tablet (10 mg total) by mouth daily.  Marland Kitchen trimethoprim (TRIMPEX) 100 MG tablet Take 100 mg by mouth at bedtime.  . vitamin B-12 (CYANOCOBALAMIN) 1000 MCG tablet Take 1,000 mcg by mouth daily.     Allergies:   Gluten, Hydromorphone hcl, and Prednisone   Social History   Tobacco Use  . Smoking status: Former Smoker    Types: Cigarettes    Quit date: 02/11/1995    Years since quitting: 23.8  . Smokeless tobacco: Never Used  Substance Use Topics  . Alcohol use: Yes    Alcohol/week: 0.0 standard drinks    Comment: occ  . Drug use: No     Family Hx: The patient's family  history includes Aortic aneurysm in her maternal grandfather; CVA in her maternal grandmother; Celiac disease in her brother; Heart attack in her mother.  ROS:   Please see the history of present illness. All other systems reviewed and are negative.   Prior CV studies:   The following studies were reviewed today:  Echocardiogram 03/08/2018: Study Conclusions  - Left ventricle: The cavity size was normal. Wall thickness was increased in a pattern of mild LVH. Systolic function was vigorous. The estimated ejection fraction was in the range of 65% to 70%. Wall motion was normal; there were no regional wall motion abnormalities. The study is not technically sufficient to allow evaluation of LV diastolic function. - Aortic valve: Moderately calcified annulus. Moderately thickened leaflets. There was mild stenosis. Mean gradient (S): 13 mm Hg. Valve area (VTI): 1.61 cm^2. Valve area (Vmax): 1.74 cm^2. Valve area (Vmean): 1.74 cm^2. - Mitral valve: There was mild regurgitation. - Left atrium: The atrium was severely dilated. - Right ventricle: The cavity size was mildly dilated. - Right atrium: The atrium was severely dilated. - Technically difficult study.  Labs/Other Tests and Data Reviewed:    EKG:  An ECG dated 10/02/2018 was personally reviewed today and demonstrated:  Rate controlled atrial fibrillation with low voltage.  Recent Labs: 10/02/2018: ALT 14; B Natriuretic Peptide 602.0; Hemoglobin 9.0; Magnesium 2.1; Platelets 318; TSH 0.380 12/08/2018: BUN 25; Creatinine, Ser 1.85; Potassium 4.0; Sodium 139    Wt Readings from Last 3 Encounters:  12/09/18 202 lb 3.2 oz (91.7 kg)  10/27/18 207 lb (93.9 kg)  10/04/18 206 lb 5.6 oz (93.6 kg)     Objective:    Vital Signs:  BP (!) 106/58   Pulse 72   Ht _0  (1.727 m)   Wt 202 lb 3.2 oz (91.7 kg)   BMI 30.74 kg/m    Patient spoke in full sentences, not short of breath. No audible wheezing or coughing.  Speech pattern normal.  ASSESSMENT & PLAN:    1.  Chronic diastolic heart failure, doing well on Lasix 20 mg daily with improvement in renal function and weight down about 5 pounds.  No changes were made today.  Follow-up BMET for next visit.  2.  Permanent atrial fibrillation.  She is on Cardizem CD with good heart rate control and tolerating Eliquis for stroke prophylaxis.  No obvious bleeding complications.  3.  Pulmonary hypertension, likely group 3.  She remains on supplemental oxygen.  4.  CKD stage III, most recent creatinine at 1.85.  Potassium normal.  COVID-19 Education: The signs and symptoms of COVID-19 were discussed with the patient and how to seek care for testing (follow up with PCP or arrange E-visit).  The  importance of social distancing was discussed today.  Time:   Today, I have spent 7 minutes with the patient with telehealth technology discussing the above problems.     Medication Adjustments/Labs and Tests Ordered: Current medicines are reviewed at length with the patient today.  Concerns regarding medicines are outlined above.   Tests Ordered: No orders of the defined types were placed in this encounter.   Medication Changes: No orders of the defined types were placed in this encounter.   Follow Up:  Schedule visit in December.   Signed, Rozann Lesches, MD  12/09/2018 4:02 PM    [facility]

## 2018-12-13 DIAGNOSIS — R339 Retention of urine, unspecified: Secondary | ICD-10-CM | POA: Diagnosis not present

## 2018-12-30 ENCOUNTER — Telehealth (HOSPITAL_COMMUNITY): Payer: Self-pay

## 2018-12-30 NOTE — Telephone Encounter (Signed)
Spoke with Patient regarding her [place] Sleep Study. I gave her the number to call [facility] to set up her study. She will contact the office if she has ay problems.

## 2019-01-02 DIAGNOSIS — R0902 Hypoxemia: Secondary | ICD-10-CM | POA: Diagnosis not present

## 2019-01-02 DIAGNOSIS — I445 Left posterior fascicular block: Secondary | ICD-10-CM | POA: Diagnosis not present

## 2019-01-04 ENCOUNTER — Other Ambulatory Visit: Payer: Self-pay | Admitting: Cardiology

## 2019-01-10 DIAGNOSIS — R339 Retention of urine, unspecified: Secondary | ICD-10-CM | POA: Diagnosis not present

## 2019-01-11 ENCOUNTER — Telehealth: Payer: Self-pay | Admitting: Cardiology

## 2019-01-11 NOTE — Telephone Encounter (Signed)
Virtual Visit Pre-Appointment Phone Call  "(Name), I am calling you today to discuss your upcoming appointment. We are currently trying to limit exposure to the virus that causes COVID-19 by seeing patients at home rather than in the office."  "What is the BEST phone number to call the day of the visit?" -   [phone]  1. Do you have or have access to (through a family member/friend) a smartphone with video capability that we can use for your visit?" a. If yes - list this number in appt notes as cell (if different from BEST phone #) and list the appointment type as a VIDEO visit in appointment notes b. If no - list the appointment type as a PHONE visit in appointment notes  2. Confirm consent - "In the setting of the current Covid19 crisis, you are scheduled for a (phone or video) visit with your provider on (date) at (time).  Just as we do with many in-office visits, in order for you to participate in this visit, we must obtain consent.  If you'd like, I can send this to your mychart (if signed up) or email for you to review.  Otherwise, I can obtain your verbal consent now.  All virtual visits are billed to your insurance company just like a normal visit would be.  By agreeing to a virtual visit, we'd like you to understand that the technology does not allow for your provider to perform an examination, and thus may limit your provider's ability to fully assess your condition. If your provider identifies any concerns that need to be evaluated in person, we will make arrangements to do so.  Finally, though the technology is pretty good, we cannot assure that it will always work on either your or our end, and in the setting of a video visit, we may have to convert it to a phone-only visit.  In either situation, we cannot ensure that we have a secure connection.  Are you willing to proceed?" STAFF: Did the patient verbally acknowledge consent to telehealth visit? Document YES/NO here:  yes 3.   4. Advise patient to be prepared - "Two hours prior to your appointment, go ahead and check your blood pressure, pulse, oxygen saturation, and your weight (if you have the equipment to check those) and write them all down. When your visit starts, your provider will ask you for this information. If you have an Apple Watch or Kardia device, please plan to have heart rate information ready on the day of your appointment. Please have a pen and paper handy nearby the day of the visit as well."  5. Give patient instructions for MyChart download to smartphone OR Doximity/Doxy.me as below if video visit (depending on what platform provider is using)  6. Inform patient they will receive a phone call 15 minutes prior to their appointment time (may be from unknown caller ID) so they should be prepared to answer    TELEPHONE CALL NOTE  Wanda [name] has been deemed a candidate for a follow-up tele-health visit to limit community exposure during the Covid-19 pandemic. I spoke with the patient via phone to ensure availability of phone/video source, confirm preferred email & phone number, and discuss instructions and expectations.  I reminded Wanda [name] to be prepared with any vital sign and/or heart rhythm information that could potentially be obtained via home monitoring, at the time of her visit. I reminded Wanda [name] to expect a phone call prior to her  visit.  Chanda Busing 01/11/2019 11:38 AM   INSTRUCTIONS FOR DOWNLOADING THE MYCHART APP TO SMARTPHONE  - The patient must first make sure to have activated MyChart and know their login information - If Apple, go to [employer] and type in MyChart in the search bar and download the app. If Android, ask patient to go to [employer] and type in [place] in the search bar and download the app. The app is free but as with any other app downloads, their phone may require them to verify saved payment information or Apple/Android  password.  - The patient will need to then log into the app with their MyChart username and password, and select [facility] as their healthcare provider to link the account. When it is time for your visit, go to the MyChart app, find appointments, and click Begin Video Visit. Be sure to Select Allow for your device to access the Microphone and Camera for your visit. You will then be connected, and your provider will be with you shortly.  **If they have any issues connecting, or need assistance please contact MyChart service desk [phone])**  **If using a computer, in order to ensure the best quality for their visit they will need to use either of the following Internet Browsers: [employer], or Google Chrome**  IF USING DOXIMITY or DOXY.ME - The patient will receive a link just prior to their visit by text.     FULL LENGTH CONSENT FOR TELE-HEALTH VISIT   I hereby voluntarily request, consent and authorize [facility] and its employed or contracted physicians, physician assistants, nurse practitioners or other licensed health care professionals (the Practitioner), to provide me with telemedicine health care services (the Services") as deemed necessary by the treating Practitioner. I acknowledge and consent to receive the Services by the Practitioner via telemedicine. I understand that the telemedicine visit will involve communicating with the Practitioner through live audiovisual communication technology and the disclosure of certain medical information by electronic transmission. I acknowledge that I have been given the opportunity to request an in-person assessment or other available alternative prior to the telemedicine visit and am voluntarily participating in the telemedicine visit.  I understand that I have the right to withhold or withdraw my consent to the use of telemedicine in the course of my care at any time, without affecting my right to future care or treatment,  and that the Practitioner or I may terminate the telemedicine visit at any time. I understand that I have the right to inspect all information obtained and/or recorded in the course of the telemedicine visit and may receive copies of available information for a reasonable fee.  I understand that some of the potential risks of receiving the Services via telemedicine include:   Delay or interruption in medical evaluation due to technological equipment failure or disruption;  Information transmitted may not be sufficient (e.g. poor resolution of images) to allow for appropriate medical decision making by the Practitioner; and/or   In rare instances, security protocols could fail, causing a breach of personal health information.  Furthermore, I acknowledge that it is my responsibility to provide information about my medical history, conditions and care that is complete and accurate to the best of my ability. I acknowledge that Practitioner's advice, recommendations, and/or decision may be based on factors not within their control, such as incomplete or inaccurate data provided by me or distortions of diagnostic images or specimens that may result from electronic transmissions. I understand  that the practice of medicine is not an exact science and that Practitioner makes no warranties or guarantees regarding treatment outcomes. I acknowledge that I will receive a copy of this consent concurrently upon execution via email to the email address I last provided but may also request a printed copy by calling the office of [facility].    I understand that my insurance will be billed for this visit.   I have read or had this consent read to me.  I understand the contents of this consent, which adequately explains the benefits and risks of the Services being provided via telemedicine.   I have been provided ample opportunity to ask questions regarding this consent and the Services and have had my questions  answered to my satisfaction.  I give my informed consent for the services to be provided through the use of telemedicine in my medical care  By participating in this telemedicine visit I agree to the above.

## 2019-01-14 ENCOUNTER — Telehealth (HOSPITAL_COMMUNITY): Payer: Self-pay

## 2019-01-14 ENCOUNTER — Telehealth (INDEPENDENT_AMBULATORY_CARE_PROVIDER_SITE_OTHER): Payer: PPO | Admitting: Cardiology

## 2019-01-14 ENCOUNTER — Encounter: Payer: Self-pay | Admitting: Cardiology

## 2019-01-14 VITALS — BP 124/68 | HR 83 | Ht 68.0 in | Wt 204.9 lb

## 2019-01-14 DIAGNOSIS — I4821 Permanent atrial fibrillation: Secondary | ICD-10-CM | POA: Diagnosis not present

## 2019-01-14 DIAGNOSIS — N1832 Chronic kidney disease, stage 3b: Secondary | ICD-10-CM | POA: Diagnosis not present

## 2019-01-14 DIAGNOSIS — I5032 Chronic diastolic (congestive) heart failure: Secondary | ICD-10-CM | POA: Diagnosis not present

## 2019-01-14 DIAGNOSIS — I272 Pulmonary hypertension, unspecified: Secondary | ICD-10-CM

## 2019-01-14 MED ORDER — APIXABAN 2.5 MG PO TABS: 2.5000 mg | ORAL_TABLET | Freq: Two times a day (BID) | ORAL | 6 refills | Status: DC

## 2019-01-14 NOTE — Patient Instructions (Addendum)
Medication Instructions:   Your physician has recommended you make the following change in your medication:   Decrease eliquis to 2.5 mg by mouth twice daily. You may break your 5 mg tablet in half twice daily until they are finished.  Continue other medications the same  Labwork:  Your physician recommends that you return for non-fasting lab work next week to check your BMET. Please have this done at [facility]. Your lab order has been faxed to their department.   Testing/Procedures:  NONE  Follow-Up:  Your physician recommends that you schedule a follow-up appointment in: 4 months.  Any Other Special Instructions Will Be Listed Below (If Applicable).  If you need a refill on your cardiac medications before your next appointment, please call your pharmacy.

## 2019-01-14 NOTE — Progress Notes (Signed)
Virtual Visit via Telephone Note   This visit type was conducted due to national recommendations for restrictions regarding the COVID-19 Pandemic (e.g. social distancing) in an effort to limit this patient's exposure and mitigate transmission in our community.  Due to her co-morbid illnesses, this patient is at least at moderate risk for complications without adequate follow up.  This format is felt to be most appropriate for this patient at this time.  The patient did not have access to video technology/had technical difficulties with video requiring transitioning to audio format only (telephone).  All issues noted in this document were discussed and addressed.  No physical exam could be performed with this format.  Please refer to the patient's chart for her  consent to telehealth for [facility].   Date:  01/14/2019   ID:  Wanda [name], DOB 1940/07/02, MRN [id number]  Patient Location: Home Provider Location: Office  PCP:  Glenda Chroman, MD  Cardiologist:  Rozann Lesches, MD   Electrophysiologist:  None   Evaluation Performed:  Follow-Up Visit  Chief Complaint:  Cardiac follow-up  History of Present Illness:    Wanda [name] is an 78 y.o. female last assessed via telehealth encounter in October.  We spoke by phone today.  She tells me that she has been doing well, stable weight and good urine output on current diuretic regimen.  She will be having follow-up lab work obtained next week.  I reviewed her lab work from October which showed BUN 25 and creatinine 1.85.  She turned 78 years old recently.  I talked with her about reducing Eliquis to 2.5 mg twice daily.  Otherwise we plan to continue her present regimen which is detailed below.  The patient does not have symptoms concerning for COVID-19 infection (fever, chills, cough, or new shortness of breath).    Past Medical History:  Diagnosis Date  . B12 deficiency   . Celiac disease   . COPD (chronic obstructive  pulmonary disease) ([place])   . Essential hypertension   . Goiter   . History of breast cancer    Status post mastectomy  . History of cardiac catheterization    No significant CAD 2006  . Mixed hyperlipidemia   . Neuropathy   . Paroxysmal atrial fibrillation (H[facility])    Coumadin therapy  . PUD (peptic ulcer disease)   . Pulmonary hypertension ([place])   . Type 2 diabetes mellitus ([place])    Past Surgical History:  Procedure Laterality Date  . DILATION AND CURETTAGE OF UTERUS    . HYSTEROSCOPY W/ ENDOMETRIAL ABLATION     [facility]  . MASTECTOMY       Current Meds  Medication Sig  . ALPRAZolam (XANAX) 1 MG tablet Take 1 mg by mouth at bedtime.   Marland Kitchen atorvastatin (LIPITOR) 10 MG tablet Take 10 mg by mouth every evening.   . carvedilol (COREG) 25 MG tablet Take 25 mg by mouth 2 (two) times daily with a meal.   . diltiazem (CARDIZEM CD) 240 MG 24 hr capsule TAKE ONE CAPSULE BY MOUTH DAILY  . furosemide (LASIX) 20 MG tablet TAKE ONE TABLET BY MOUTH EVERY DAY  . gabapentin (NEURONTIN) 100 MG capsule Take 100 mg by mouth 3 (three) times daily.   Marland Kitchen glimepiride (AMARYL) 1 MG tablet Take 2 mg by mouth daily with breakfast.   . hydrALAZINE (APRESOLINE) 25 MG tablet TAKE TWO (2) TABLETS BY MOUTH THREE TIMES DAILY.  Marland Kitchen lisinopril (ZESTRIL) 10 MG tablet Take 1 tablet (  10 mg total) by mouth daily.  Marland Kitchen trimethoprim (TRIMPEX) 100 MG tablet Take 100 mg by mouth at bedtime.  . vitamin B-12 (CYANOCOBALAMIN) 1000 MCG tablet Take 1,000 mcg by mouth daily.  . [DISCONTINUED] apixaban (ELIQUIS) 5 MG TABS tablet Take 1 tablet (5 mg total) by mouth 2 (two) times daily.     Allergies:   Gluten, Hydromorphone hcl, and Prednisone   Social History   Tobacco Use  . Smoking status: Former Smoker    Types: Cigarettes    Quit date: 02/11/1995    Years since quitting: 23.9  . Smokeless tobacco: Never Used  Substance Use Topics  . Alcohol use: Yes    Alcohol/week: 0.0 standard drinks    Comment: occ  . Drug use:  No     Family Hx: The patient's family history includes Aortic aneurysm in her maternal grandfather; CVA in her maternal grandmother; Celiac disease in her brother; Heart attack in her mother.  ROS:   Please see the history of present illness. All other systems reviewed and are negative.   Prior CV studies:   The following studies were reviewed today:  Echocardiogram 03/08/2018: Study Conclusions  - Left ventricle: The cavity size was normal. Wall thickness was increased in a pattern of mild LVH. Systolic function was vigorous. The estimated ejection fraction was in the range of 65% to 70%. Wall motion was normal; there were no regional wall motion abnormalities. The study is not technically sufficient to allow evaluation of LV diastolic function. - Aortic valve: Moderately calcified annulus. Moderately thickened leaflets. There was mild stenosis. Mean gradient (S): 13 mm Hg. Valve area (VTI): 1.61 cm^2. Valve area (Vmax): 1.74 cm^2. Valve area (Vmean): 1.74 cm^2. - Mitral valve: There was mild regurgitation. - Left atrium: The atrium was severely dilated. - Right ventricle: The cavity size was mildly dilated. - Right atrium: The atrium was severely dilated. - Technically difficult study.  Labs/Other Tests and Data Reviewed:    EKG:  An ECG dated 10/02/2018 was personally reviewed today and demonstrated:  Rate controlled atrial fibrillation with low voltage.  Recent Labs: 10/02/2018: ALT 14; B Natriuretic Peptide 602.0; Hemoglobin 9.0; Magnesium 2.1; Platelets 318; TSH 0.380 12/08/2018: BUN 25; Creatinine, Ser 1.85; Potassium 4.0; Sodium 139   Wt Readings from Last 3 Encounters:  01/14/19 204 lb 14.4 oz (92.9 kg)  12/09/18 202 lb 3.2 oz (91.7 kg)  10/27/18 207 lb (93.9 kg)     Objective:    Vital Signs:  BP 124/68   Pulse 83   Ht 5' 8" (1.727 m)   Wt 204 lb 14.4 oz (92.9 kg)   BMI 31.15 kg/m    Patient spoke in full sentences, not short of breath.  No audible wheezing or coughing. Speech pattern normal.  ASSESSMENT & PLAN:    1.  Permanent atrial fibrillation.  She reports no palpitations and is tolerating Cardizem CD along with Eliquis.  We will reduce her Eliquis to 2.5 mg twice daily (age and creatinine).  2.  Chronic diastolic heart failure.  Weight is stable and she reports good urine output on current dose of Lasix.  Follow-up BMET.  3.  CKD stage IIIb, last creatinine 1.85.  4.  Pulmonary hypertension, WHO group 3.  She remains on supplemental oxygen.  COVID-19 Education: The signs and symptoms of COVID-19 were discussed with the patient and how to seek care for testing (follow up with PCP or arrange E-visit).  The importance of social distancing was discussed today.  Time:   Today, I have spent 12 minutes with the patient with telehealth technology discussing the above problems.     Medication Adjustments/Labs and Tests Ordered: Current medicines are reviewed at length with the patient today.  Concerns regarding medicines are outlined above.   Tests Ordered: No orders of the defined types were placed in this encounter.   Medication Changes: Meds ordered this encounter  Medications  . apixaban (ELIQUIS) 2.5 MG TABS tablet    Sig: Take 1 tablet (2.5 mg total) by mouth 2 (two) times daily.    Dispense:  60 tablet    Refill:  6    01/14/2019 dose decrease    Follow Up:  Either In Person or Virtual 4 months.  Signed, Rozann Lesches, MD  01/14/2019 11:29 AM    [place]

## 2019-01-14 NOTE — Telephone Encounter (Signed)
Returned call to patient as she left message on vm.  She reports that she is having challenges with getting sleep study.  Message given to [place] to help patient navigate this problem.  Also patient would like to reschedule  Mondays appt as she has transportation issues. Message given to Barnett Applebaum, she will get patient rescheduled

## 2019-01-14 NOTE — Telephone Encounter (Signed)
Returned a call to the Patient per Linus Orn, to discuss her [facility] Sleep Study. After calling [facility] and speaking to the representitive, she states that the Patients' insurance denied the study. So I Asked the Patient did she want to go to the Sleep Center overnight and she stated " Im Not Staying Anywhere Overnight with this Covid Everywhere. " She also stated that she is not able to pay the cash price for the sleep study.

## 2019-01-17 ENCOUNTER — Encounter (HOSPITAL_COMMUNITY): Payer: PPO | Admitting: Internal Medicine

## 2019-01-18 ENCOUNTER — Telehealth: Payer: Self-pay | Admitting: Cardiology

## 2019-01-18 NOTE — Telephone Encounter (Signed)
Yes, that is okay.  We can wait for the next visit.

## 2019-01-18 NOTE — Telephone Encounter (Signed)
Patient called stating that she does not want to go and have BMET at this time.  She wants to verify if Dr.McDowell is ok with this.

## 2019-01-18 NOTE — Telephone Encounter (Signed)
Patient notified and verbalized understanding.

## 2019-01-29 ENCOUNTER — Other Ambulatory Visit (HOSPITAL_COMMUNITY): Payer: Self-pay | Admitting: Internal Medicine

## 2019-02-01 DIAGNOSIS — I445 Left posterior fascicular block: Secondary | ICD-10-CM | POA: Diagnosis not present

## 2019-02-01 DIAGNOSIS — R0902 Hypoxemia: Secondary | ICD-10-CM | POA: Diagnosis not present

## 2019-02-23 ENCOUNTER — Ambulatory Visit (HOSPITAL_COMMUNITY)
Admission: RE | Admit: 2019-02-23 | Discharge: 2019-02-23 | Disposition: A | Discharge status: Home / Self Care | Payer: PPO | Source: Ambulatory Visit | Attending: Internal Medicine | Admitting: Internal Medicine

## 2019-02-23 ENCOUNTER — Telehealth (HOSPITAL_COMMUNITY): Payer: Self-pay

## 2019-02-23 ENCOUNTER — Other Ambulatory Visit: Payer: Self-pay

## 2019-02-23 ENCOUNTER — Other Ambulatory Visit (HOSPITAL_COMMUNITY): Payer: Self-pay

## 2019-02-23 DIAGNOSIS — N1832 Chronic kidney disease, stage 3b: Secondary | ICD-10-CM

## 2019-02-23 DIAGNOSIS — I272 Pulmonary hypertension, unspecified: Secondary | ICD-10-CM

## 2019-02-23 DIAGNOSIS — I4821 Permanent atrial fibrillation: Secondary | ICD-10-CM

## 2019-02-23 DIAGNOSIS — I5032 Chronic diastolic (congestive) heart failure: Secondary | ICD-10-CM

## 2019-02-23 DIAGNOSIS — R0683 Snoring: Secondary | ICD-10-CM

## 2019-02-23 NOTE — Telephone Encounter (Signed)
After having a virtual appointment with Dr. Haroldine Laws the Patient decided to have the [facility] Sleep Study and I ordered it for [facility]. They will contact the Patient to set up.

## 2019-02-23 NOTE — Progress Notes (Signed)
Heart Failure TeleHealth Note  Due to national recommendations of social distancing due to [place] 19, Audio/video telehealth visit is felt to be most appropriate for this patient at this time.  See MyChart message from today for patient consent regarding telehealth for [facility].  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   She has a complicated PAH history.   Found to be hypoxemic in Dec 2010 started on O2. CT chest 12/10: No PE. Elevation of R hemidiaphragm. Areas of centrilobular emphysema. atelectasis in both bases. Large left lobe of thyroid with trachea deviation.  Cath 2006 no significant CAD  Echo 01/23/09: 55-60% mild LVH with pseudonormalizaiton. Mild to moderate MR. RV mild to moderately dilated. Mild RV dysfunction. Mild AS mean 34m HG.  RSVP 67.     Cath 1/11: RA 11, RV 84/6 with an EDP of 18, PA 83/28 with a mean of 49. PCWP 15  Fick cardiac output 7.0 L/min.  Cardiac index 3.0  PVR is 4.9 Woods units.  Started on Tyvaso (treprostinil) in March 2011. Repeat RHC 11/11 with marked improvement  of pressures. RA 4 RV 43/4 with EDP of 10.  PAP 42/15 with a mean of 25.  PCWP 9 Fick cardiac output 4.7.  Cardiac index 2.1.  PVR 3.4 Woods units.  Femoral artery saturation is 93% on 3 L of supplemental oxygen.  PA saturations were 53% and 57%.  Off Tyvaso August 2013. ECHO 12/26/2011 EF 55-60% Peak PA pressure 46  In 11/19 had episode of severe epistaxis. Coumadin stopped (was on for  chronic AF). Eventually switched to Eliquis. No further bleeding.   PYP scan 3/20: H/CLL 1.1 Negative for TTR  PFTs 3/20:   FEV1 1.58 (66%)  FVC 3.09 (97%)  FEV1/FVC (74%)  FEF 25-75 0.53L  DLCO 48%  Echo 03/08/18: EF 65-70% mild AS. RVSP 45-50. Severe biatrial enlargement suggestive of restrictive CM Personally reviewed  She presents via audio/video conferencing for a telehealth visit today.  At last visit we ordered home sleep study but insurance apparently denied home testing. Doing fine. Able to do ADLs without too much problem. Wears O2. Wants a mobile condenser. No CP. No edema, orthopnea or PND.    SMervin Hackdenies symptoms worrisome for COVID 19.   Past Medical History:  Diagnosis Date  . B12 deficiency   . Celiac disease   . COPD (chronic obstructive pulmonary disease) ([place]   . Essential hypertension   . Goiter   . History of breast cancer    Status post mastectomy  . History of cardiac catheterization    No significant CAD 2006  . Mixed hyperlipidemia   . Neuropathy   . Paroxysmal atrial fibrillation (HCC)    Coumadin therapy  . PUD (peptic ulcer disease)   . Pulmonary hypertension ([place]   . Type 2 diabetes mellitus ([place]    Past Surgical History:  Procedure Laterality Date  . DILATION AND CURETTAGE OF UTERUS    . HYSTEROSCOPY W/ ENDOMETRIAL ABLATION  ThermaChoice  . MASTECTOMY       Current Outpatient Medications  Medication Sig Dispense Refill  . ALPRAZolam (XANAX) 1 MG tablet Take 1 mg by mouth at bedtime.     Marland Kitchen apixaban (ELIQUIS) 2.5 MG TABS tablet Take 1 tablet (2.5 mg total) by mouth 2 (two) times daily. 60 tablet 6  . atorvastatin (LIPITOR) 10 MG tablet Take 10 mg by mouth every evening.     . carvedilol (COREG) 25 MG tablet Take 25 mg by mouth 2 (two) times daily with a meal.     . diltiazem (CARDIZEM CD) 240 MG 24 hr capsule TAKE ONE CAPSULE BY MOUTH DAILY 90 capsule 2  . furosemide (LASIX) 20 MG tablet TAKE ONE TABLET BY MOUTH  EVERY DAY 30 tablet 2  . gabapentin (NEURONTIN) 100 MG capsule Take 100 mg by mouth 3 (three) times daily.     Marland Kitchen glimepiride (AMARYL) 1 MG tablet Take 2 mg by mouth daily with breakfast.     . hydrALAZINE (APRESOLINE) 25 MG tablet TAKE TWO (2) TABLETS BY MOUTH THREE TIMES DAILY. 180 tablet 3  . lisinopril (ZESTRIL) 10 MG tablet Take 1 tablet (10 mg total) by mouth daily.    Marland Kitchen trimethoprim (TRIMPEX) 100 MG tablet Take 100 mg by mouth at bedtime.    . vitamin B-12 (CYANOCOBALAMIN) 1000 MCG tablet Take 1,000 mcg by mouth daily.     No current facility-administered medications for this encounter.    Allergies:   Gluten, Hydromorphone hcl, and Prednisone   Social History:  The patient  reports that she quit smoking about 24 years ago. Her smoking use included cigarettes. She has never used smokeless tobacco. She reports current alcohol use. She reports that she does not use drugs.   Family History:  The patient's family history includes Aortic aneurysm in her maternal grandfather; CVA in her maternal grandmother; Celiac disease in her brother; Heart attack in her mother.   ROS:  Please see the history of present illness.   All other systems are personally reviewed and negative.   Exam:  (Video/Tele Health Call; Exam is subjective and or/visual.) General:  Speaks in full sentences. No resp difficulty. Lungs: Normal respiratory effort with conversation.  Abdomen: Obese Non-distended per patient report Extremities: Pt denies edema. Neuro: Alert & oriented x 3.   Recent Labs: 10/02/2018: ALT 14; B Natriuretic Peptide 602.0; Hemoglobin 9.0; Magnesium 2.1; Platelets 318; TSH 0.380 12/08/2018: BUN 25; Creatinine, Ser 1.85; Potassium 4.0; Sodium 139  Personally reviewed   Wt Readings from Last 3 Encounters:  01/14/19 92.9 kg (204 lb 14.4 oz)  12/09/18 91.7 kg (202 lb 3.2 oz)  10/27/18 93.9 kg (207 lb)      ASSESSMENT AND PLAN:  1. 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 If not covered can order in-lab study at [facility] [place] - Continue O2 - Can repeat RHC as needed  2. 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 A Fib, permanent - Rate Controlled. On cardizem. - Tolerating Eliquis well. On 2.5 bid (age 20, Cr > 1.5) No further epistaxis  4. COPD - Stable. On Home O2. Have  reached out to [facility] to see if they can help her get portable tank - PFTs - Pending Pulmonary eval. Will refer to [facility]   5. HTN - Blood pressure well controlled. Continue current regimen.  6. DM2 - We discussed possibility of Jardiance in past but given self-cathing will not use SGLT2i   COVID screen The patient does not have any symptoms that suggest any further testing/ screening at this time.  Social distancing reinforced today.  Recommended follow-up:  As above  Relevant cardiac medications were reviewed at length with the patient today.   The patient does not have concerns regarding their medications at this time.   The following changes were made today:  As above  Today, I have spent 16 minutes with the patient with telehealth technology discussing the above issues .    Signed, Glori Bickers, MD  02/23/2019 12:20 PM  [facility] [place] [address] [facility] and [place] [address] [phone] (office) [phone] (fax)

## 2019-02-28 ENCOUNTER — Other Ambulatory Visit: Payer: Self-pay | Admitting: Cardiology

## 2019-03-03 DIAGNOSIS — Z6831 Body mass index (BMI) 31.0-31.9, adult: Secondary | ICD-10-CM | POA: Diagnosis not present

## 2019-03-03 DIAGNOSIS — R339 Retention of urine, unspecified: Secondary | ICD-10-CM | POA: Diagnosis not present

## 2019-03-03 DIAGNOSIS — Z299 Encounter for prophylactic measures, unspecified: Secondary | ICD-10-CM | POA: Diagnosis not present

## 2019-03-03 DIAGNOSIS — J449 Chronic obstructive pulmonary disease, unspecified: Secondary | ICD-10-CM | POA: Diagnosis not present

## 2019-03-03 DIAGNOSIS — N184 Chronic kidney disease, stage 4 (severe): Secondary | ICD-10-CM | POA: Diagnosis not present

## 2019-03-03 DIAGNOSIS — M1990 Unspecified osteoarthritis, unspecified site: Secondary | ICD-10-CM | POA: Diagnosis not present

## 2019-03-04 DIAGNOSIS — R0902 Hypoxemia: Secondary | ICD-10-CM | POA: Diagnosis not present

## 2019-03-04 DIAGNOSIS — I445 Left posterior fascicular block: Secondary | ICD-10-CM | POA: Diagnosis not present

## 2019-03-05 DIAGNOSIS — R339 Retention of urine, unspecified: Secondary | ICD-10-CM | POA: Diagnosis not present

## 2019-03-25 DIAGNOSIS — R339 Retention of urine, unspecified: Secondary | ICD-10-CM | POA: Diagnosis not present

## 2019-04-04 DIAGNOSIS — R0902 Hypoxemia: Secondary | ICD-10-CM | POA: Diagnosis not present

## 2019-04-04 DIAGNOSIS — I445 Left posterior fascicular block: Secondary | ICD-10-CM | POA: Diagnosis not present

## 2019-04-10 DIAGNOSIS — I10 Essential (primary) hypertension: Secondary | ICD-10-CM | POA: Diagnosis not present

## 2019-04-10 DIAGNOSIS — F419 Anxiety disorder, unspecified: Secondary | ICD-10-CM | POA: Diagnosis not present

## 2019-04-10 DIAGNOSIS — M1990 Unspecified osteoarthritis, unspecified site: Secondary | ICD-10-CM | POA: Diagnosis not present

## 2019-05-02 DIAGNOSIS — I445 Left posterior fascicular block: Secondary | ICD-10-CM | POA: Diagnosis not present

## 2019-05-02 DIAGNOSIS — R0902 Hypoxemia: Secondary | ICD-10-CM | POA: Diagnosis not present

## 2019-05-09 DIAGNOSIS — I270 Primary pulmonary hypertension: Secondary | ICD-10-CM | POA: Diagnosis not present

## 2019-05-09 DIAGNOSIS — J449 Chronic obstructive pulmonary disease, unspecified: Secondary | ICD-10-CM | POA: Diagnosis not present

## 2019-05-09 DIAGNOSIS — N184 Chronic kidney disease, stage 4 (severe): Secondary | ICD-10-CM | POA: Diagnosis not present

## 2019-05-09 DIAGNOSIS — I509 Heart failure, unspecified: Secondary | ICD-10-CM | POA: Diagnosis not present

## 2019-05-09 DIAGNOSIS — Z87891 Personal history of nicotine dependence: Secondary | ICD-10-CM | POA: Diagnosis not present

## 2019-05-09 DIAGNOSIS — R339 Retention of urine, unspecified: Secondary | ICD-10-CM | POA: Diagnosis not present

## 2019-05-09 DIAGNOSIS — Z299 Encounter for prophylactic measures, unspecified: Secondary | ICD-10-CM | POA: Diagnosis not present

## 2019-05-09 DIAGNOSIS — E1122 Type 2 diabetes mellitus with diabetic chronic kidney disease: Secondary | ICD-10-CM | POA: Diagnosis not present

## 2019-05-16 ENCOUNTER — Telehealth (INDEPENDENT_AMBULATORY_CARE_PROVIDER_SITE_OTHER): Payer: PPO | Admitting: Cardiology

## 2019-05-16 ENCOUNTER — Encounter: Payer: Self-pay | Admitting: Cardiology

## 2019-05-16 VITALS — BP 135/48 | HR 86 | Ht 68.0 in | Wt 197.0 lb

## 2019-05-16 DIAGNOSIS — I5032 Chronic diastolic (congestive) heart failure: Secondary | ICD-10-CM | POA: Diagnosis not present

## 2019-05-16 DIAGNOSIS — I4821 Permanent atrial fibrillation: Secondary | ICD-10-CM

## 2019-05-16 DIAGNOSIS — I272 Pulmonary hypertension, unspecified: Secondary | ICD-10-CM

## 2019-05-16 DIAGNOSIS — N1832 Chronic kidney disease, stage 3b: Secondary | ICD-10-CM | POA: Diagnosis not present

## 2019-05-16 NOTE — Patient Instructions (Signed)
Your physician wants you to follow-up in: [place] will receive a reminder letter in the mail two months in advance. If you don't receive a letter, please call our office to schedule the follow-up appointment.  Your physician recommends that you continue on your current medications as directed. Please refer to the Current Medication list given to you today.  Your physician recommends that you return for lab work BMP - [place] [facility]   Thank you for choosing [facility]!!

## 2019-05-16 NOTE — Progress Notes (Signed)
Virtual Visit via Telephone Note   This visit type was conducted due to national recommendations for restrictions regarding the COVID-19 Pandemic (e.g. social distancing) in an effort to limit this patient's exposure and mitigate transmission in our community.  Due to her co-morbid illnesses, this patient is at least at moderate risk for complications without adequate follow up.  This format is felt to be most appropriate for this patient at this time.  The patient did not have access to video technology/had technical difficulties with video requiring transitioning to audio format only (telephone).  All issues noted in this document were discussed and addressed.  No physical exam could be performed with this format.  Please refer to the patient's chart for her  consent to telehealth for [facility].   The patient was identified using 2 identifiers.  Date:  05/16/2019   ID:  Wanda [name], DOB [birth year], MRN [id number]  Patient Location: Home Provider Location: Office  PCP:  Glenda Chroman, MD  Cardiologist:  Rozann Lesches, MD Electrophysiologist:  None   Evaluation Performed:  Follow-Up Visit  Chief Complaint:  Cardiac follow-up  History of Present Illness:    Wanda [name] is a 79 y.o. female last assessed via telehealth encounter in December 2020.  We spoke by phone today.  She and her son's mother-in-law are living together in the same home, she no longer drives at this point.  She reports regular use of supplemental oxygen as before, has actually lost weight mainly through diet.  She does not describe any major changes in her medications with the exception of no longer being on a diabetic regimen.  Her last hemoglobin A1c was 5.6%.  She did have an interval visit with Dr. Haroldine Laws in January, I reviewed the note.  We discussed plans for follow-up BMET.  She does not report any spontaneous bleeding problems on Eliquis.   Past Medical History:  Diagnosis Date  . B12  deficiency   . Celiac disease   . COPD (chronic obstructive pulmonary disease) ([place])   . Essential hypertension   . Goiter   . History of breast cancer    Status post mastectomy  . History of cardiac catheterization    No significant CAD 2006  . Mixed hyperlipidemia   . Neuropathy   . Paroxysmal atrial fibrillation (HCC)    Coumadin therapy  . PUD (peptic ulcer disease)   . Pulmonary hypertension ([place])   . Type 2 diabetes mellitus ([place])    Past Surgical History:  Procedure Laterality Date  . DILATION AND CURETTAGE OF UTERUS    . HYSTEROSCOPY W/ ENDOMETRIAL ABLATION     ThermaChoice  . MASTECTOMY       Current Meds  Medication Sig  . ALPRAZolam (XANAX) 1 MG tablet Take 1 mg by mouth at bedtime.   Marland Kitchen apixaban (ELIQUIS) 2.5 MG TABS tablet Take 1 tablet (2.5 mg total) by mouth 2 (two) times daily.  Marland Kitchen atorvastatin (LIPITOR) 10 MG tablet Take 10 mg by mouth every evening.   . carvedilol (COREG) 25 MG tablet Take 25 mg by mouth 2 (two) times daily with a meal.   . diltiazem (CARDIZEM CD) 240 MG 24 hr capsule TAKE ONE CAPSULE BY MOUTH DAILY  . furosemide (LASIX) 20 MG tablet TAKE ONE TABLET BY MOUTH EVERY DAY  . gabapentin (NEURONTIN) 100 MG capsule Take 100 mg by mouth 3 (three) times daily.   . hydrALAZINE (APRESOLINE) 25 MG tablet TAKE TWO (2) TABLETS BY MOUTH THREE  TIMES DAILY.  Marland Kitchen lisinopril (ZESTRIL) 10 MG tablet Take 1 tablet (10 mg total) by mouth daily.  Marland Kitchen trimethoprim (TRIMPEX) 100 MG tablet Take 100 mg by mouth at bedtime.  . vitamin B-12 (CYANOCOBALAMIN) 1000 MCG tablet Take 1,000 mcg by mouth daily.  . [DISCONTINUED] glimepiride (AMARYL) 1 MG tablet Take 2 mg by mouth daily with breakfast.      Allergies:   Gluten, Hydromorphone hcl, and Prednisone   ROS:  Neuropathy symptoms, also reported cognitive function decline per testing done with PCP.  Prior CV studies:   The following studies were reviewed today:  Echocardiogram 03/08/2018: Study Conclusions   - Left  ventricle: The cavity size was normal. Wall thickness was  increased in a pattern of mild LVH. Systolic function was  vigorous. The estimated ejection fraction was in the range of 65%  to 70%. Wall motion was normal; there were no regional wall  motion abnormalities. The study is not technically sufficient to  allow evaluation of LV diastolic function.  - Aortic valve: Moderately calcified annulus. Moderately thickened  leaflets. There was mild stenosis. Mean gradient (S): 13 mm Hg.  Valve area (VTI): 1.61 cm^2. Valve area (Vmax): 1.74 cm^2. Valve  area (Vmean): 1.74 cm^2.  - Mitral valve: There was mild regurgitation.  - Left atrium: The atrium was severely dilated.  - Right ventricle: The cavity size was mildly dilated.  - Right atrium: The atrium was severely dilated.  - Technically difficult study.  Labs/Other Tests and Data Reviewed:    EKG:  An ECG dated 10/02/2018 was personally reviewed today and demonstrated:  Rate controlled atrial fibrillation with low voltage.  Recent Labs: 10/02/2018: ALT 14; B Natriuretic Peptide 602.0; Hemoglobin 9.0; Magnesium 2.1; Platelets 318; TSH 0.380 12/08/2018: BUN 25; Creatinine, Ser 1.85; Potassium 4.0; Sodium 139    Wt Readings from Last 3 Encounters:  05/16/19 197 lb (89.4 kg)  01/14/19 204 lb 14.4 oz (92.9 kg)  12/09/18 202 lb 3.2 oz (91.7 kg)     Objective:    Vital Signs:  BP (!) 135/48   Pulse 86   Ht _0  (1.727 m)   Wt 197 lb (89.4 kg)   BMI 29.95 kg/m    Patient spoke in full sentences, not short of breath. No audible wheezing or coughing.  ASSESSMENT & PLAN:    1.  Pulmonary hypertension, WHO group 3.  She continues to follow with Dr. Haroldine Laws and remains on supplemental oxygen.  2.  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  Her weight is down mainly attributable to diet, she does not report any change in diuretic regimen.  3.  CKD stage  III, last creatinine 1.85.  Follow-up BMET.  4.  Permanent atrial fibrillation, asymptomatic in terms of palpitations.  She is on both Coreg and Cardizem CD for heart rate control and otherwise continues on low dose Eliquis.   Time:   Today, I have spent 7 minutes with the patient with telehealth technology discussing the above problems.     Medication Adjustments/Labs and Tests Ordered: Current medicines are reviewed at length with the patient today.  Concerns regarding medicines are outlined above.   Tests Ordered: Orders Placed This Encounter  Procedures  . Basic Metabolic Panel (BMET)    Medication Changes: No orders of the defined types were placed in this encounter.   Follow Up:  Either In Person or Virtual 6 months.  Signed, Rozann Lesches, MD  05/16/2019 2:31 PM  [facility]

## 2019-05-30 ENCOUNTER — Other Ambulatory Visit (HOSPITAL_COMMUNITY): Payer: Self-pay | Admitting: Internal Medicine

## 2019-05-30 ENCOUNTER — Other Ambulatory Visit: Payer: Self-pay | Admitting: Cardiology

## 2019-05-31 ENCOUNTER — Other Ambulatory Visit (HOSPITAL_COMMUNITY)
Admission: RE | Admit: 2019-05-31 | Discharge: 2019-05-31 | Disposition: A | Discharge status: Home / Self Care | Payer: PPO | Source: Ambulatory Visit | Attending: Cardiology | Admitting: Cardiology

## 2019-05-31 DIAGNOSIS — I4821 Permanent atrial fibrillation: Secondary | ICD-10-CM | POA: Diagnosis not present

## 2019-05-31 LAB — BASIC METABOLIC PANEL
Anion gap: 11 (ref 5–15)
BUN: 20 mg/dL (ref 8–23)
CO2: 33 mmol/L — ABNORMAL HIGH (ref 22–32)
Calcium: 9.2 mg/dL (ref 8.9–10.3)
Chloride: 97 mmol/L — ABNORMAL LOW (ref 98–111)
Creatinine, Ser: 1.66 mg/dL — ABNORMAL HIGH (ref 0.44–1.00)
GFR calc Af Amer: 33 mL/min — ABNORMAL LOW (ref >60)
GFR calc non Af Amer: 29 mL/min — ABNORMAL LOW (ref >60)
Glucose, Bld: 111 mg/dL — ABNORMAL HIGH (ref 70–99)
Potassium: 4.1 mmol/L (ref 3.5–5.1)
Sodium: 141 mmol/L (ref 135–145)

## 2019-06-01 DIAGNOSIS — R339 Retention of urine, unspecified: Secondary | ICD-10-CM | POA: Diagnosis not present

## 2019-06-02 DIAGNOSIS — I445 Left posterior fascicular block: Secondary | ICD-10-CM | POA: Diagnosis not present

## 2019-06-02 DIAGNOSIS — R0902 Hypoxemia: Secondary | ICD-10-CM | POA: Diagnosis not present

## 2019-06-03 ENCOUNTER — Telehealth: Payer: Self-pay | Admitting: *Deleted

## 2019-06-03 DIAGNOSIS — E1165 Type 2 diabetes mellitus with hyperglycemia: Secondary | ICD-10-CM | POA: Diagnosis not present

## 2019-06-03 DIAGNOSIS — F419 Anxiety disorder, unspecified: Secondary | ICD-10-CM | POA: Diagnosis not present

## 2019-06-03 NOTE — Telephone Encounter (Signed)
-----  Message from Satira Sark, MD sent at 05/31/2019  1:42 PM EDT ----- Results reviewed.  Potassium is normal, creatinine 1.66, improved from 1.85.  Continue with current regimen.

## 2019-06-03 NOTE — Telephone Encounter (Signed)
Patient informed. Copy sent to PCP

## 2019-06-10 DIAGNOSIS — E1122 Type 2 diabetes mellitus with diabetic chronic kidney disease: Secondary | ICD-10-CM | POA: Diagnosis not present

## 2019-06-10 DIAGNOSIS — N189 Chronic kidney disease, unspecified: Secondary | ICD-10-CM | POA: Diagnosis not present

## 2019-07-02 DIAGNOSIS — I445 Left posterior fascicular block: Secondary | ICD-10-CM | POA: Diagnosis not present

## 2019-07-02 DIAGNOSIS — R0902 Hypoxemia: Secondary | ICD-10-CM | POA: Diagnosis not present

## 2019-07-05 DIAGNOSIS — R339 Retention of urine, unspecified: Secondary | ICD-10-CM | POA: Diagnosis not present

## 2019-07-08 ENCOUNTER — Other Ambulatory Visit: Payer: Self-pay | Admitting: Cardiology

## 2019-08-01 ENCOUNTER — Telehealth: Payer: Self-pay | Admitting: Cardiology

## 2019-08-01 NOTE — Telephone Encounter (Signed)
  Patient Consent for Virtual Visit         Wanda [name] has provided verbal consent on 08/01/2019 for a virtual visit (video or telephone).   CONSENT FOR VIRTUAL VISIT FOR:  Wanda [name]  By participating in this virtual visit I agree to the following:  I hereby voluntarily request, consent and authorize [facility] and its employed or contracted physicians, physician assistants, nurse practitioners or other licensed health care professionals (the Practitioner), to provide me with telemedicine health care services (the "Services") as deemed necessary by the treating Practitioner. I acknowledge and consent to receive the Services by the Practitioner via telemedicine. I understand that the telemedicine visit will involve communicating with the Practitioner through live audiovisual communication technology and the disclosure of certain medical information by electronic transmission. I acknowledge that I have been given the opportunity to request an in-person assessment or other available alternative prior to the telemedicine visit and am voluntarily participating in the telemedicine visit.  I understand that I have the right to withhold or withdraw my consent to the use of telemedicine in the course of my care at any time, without affecting my right to future care or treatment, and that the Practitioner or I may terminate the telemedicine visit at any time. I understand that I have the right to inspect all information obtained and/or recorded in the course of the telemedicine visit and may receive copies of available information for a reasonable fee.  I understand that some of the potential risks of receiving the Services via telemedicine include:  Marland Kitchen Delay or interruption in medical evaluation due to technological equipment failure or disruption; . Information transmitted may not be sufficient (e.g. poor resolution of images) to allow for appropriate medical decision making by the  Practitioner; and/or  . In rare instances, security protocols could fail, causing a breach of personal health information.  Furthermore, I acknowledge that it is my responsibility to provide information about my medical history, conditions and care that is complete and accurate to the best of my ability. I acknowledge that Practitioner's advice, recommendations, and/or decision may be based on factors not within their control, such as incomplete or inaccurate data provided by me or distortions of diagnostic images or specimens that may result from electronic transmissions. I understand that the practice of medicine is not an exact science and that Practitioner makes no warranties or guarantees regarding treatment outcomes. I acknowledge that a copy of this consent can be made available to me via my patient portal ([facility]), or I can request a printed copy by calling the office of [place].    I understand that my insurance will be billed for this visit.   I have read or had this consent read to me. . I understand the contents of this consent, which adequately explains the benefits and risks of the Services being provided via telemedicine.  . I have been provided ample opportunity to ask questions regarding this consent and the Services and have had my questions answered to my satisfaction. . I give my informed consent for the services to be provided through the use of telemedicine in my medical care

## 2019-08-02 ENCOUNTER — Telehealth: Payer: PPO | Admitting: Cardiology

## 2019-08-02 DIAGNOSIS — I445 Left posterior fascicular block: Secondary | ICD-10-CM | POA: Diagnosis not present

## 2019-08-02 DIAGNOSIS — R0902 Hypoxemia: Secondary | ICD-10-CM | POA: Diagnosis not present

## 2019-08-02 DIAGNOSIS — R339 Retention of urine, unspecified: Secondary | ICD-10-CM | POA: Diagnosis not present

## 2019-08-10 DIAGNOSIS — J309 Allergic rhinitis, unspecified: Secondary | ICD-10-CM | POA: Diagnosis not present

## 2019-08-10 DIAGNOSIS — I10 Essential (primary) hypertension: Secondary | ICD-10-CM | POA: Diagnosis not present

## 2019-09-01 DIAGNOSIS — I445 Left posterior fascicular block: Secondary | ICD-10-CM | POA: Diagnosis not present

## 2019-09-01 DIAGNOSIS — R0902 Hypoxemia: Secondary | ICD-10-CM | POA: Diagnosis not present

## 2019-09-07 DIAGNOSIS — R339 Retention of urine, unspecified: Secondary | ICD-10-CM | POA: Diagnosis not present

## 2019-09-09 DIAGNOSIS — J309 Allergic rhinitis, unspecified: Secondary | ICD-10-CM | POA: Diagnosis not present

## 2019-09-09 DIAGNOSIS — I10 Essential (primary) hypertension: Secondary | ICD-10-CM | POA: Diagnosis not present

## 2019-09-13 DIAGNOSIS — I10 Essential (primary) hypertension: Secondary | ICD-10-CM | POA: Diagnosis not present

## 2019-09-13 DIAGNOSIS — Z1211 Encounter for screening for malignant neoplasm of colon: Secondary | ICD-10-CM | POA: Diagnosis not present

## 2019-09-13 DIAGNOSIS — E1122 Type 2 diabetes mellitus with diabetic chronic kidney disease: Secondary | ICD-10-CM | POA: Diagnosis not present

## 2019-09-13 DIAGNOSIS — Z1339 Encounter for screening examination for other mental health and behavioral disorders: Secondary | ICD-10-CM | POA: Diagnosis not present

## 2019-09-13 DIAGNOSIS — Z79899 Other long term (current) drug therapy: Secondary | ICD-10-CM | POA: Diagnosis not present

## 2019-09-13 DIAGNOSIS — Z6828 Body mass index (BMI) 28.0-28.9, adult: Secondary | ICD-10-CM | POA: Diagnosis not present

## 2019-09-13 DIAGNOSIS — R5383 Other fatigue: Secondary | ICD-10-CM | POA: Diagnosis not present

## 2019-09-13 DIAGNOSIS — E1165 Type 2 diabetes mellitus with hyperglycemia: Secondary | ICD-10-CM | POA: Diagnosis not present

## 2019-09-13 DIAGNOSIS — Z7189 Other specified counseling: Secondary | ICD-10-CM | POA: Diagnosis not present

## 2019-09-13 DIAGNOSIS — E7800 Pure hypercholesterolemia, unspecified: Secondary | ICD-10-CM | POA: Diagnosis not present

## 2019-09-13 DIAGNOSIS — Z1331 Encounter for screening for depression: Secondary | ICD-10-CM | POA: Diagnosis not present

## 2019-09-13 DIAGNOSIS — Z0000 Encounter for general adult medical examination without abnormal findings: Secondary | ICD-10-CM | POA: Diagnosis not present

## 2019-09-13 DIAGNOSIS — Z299 Encounter for prophylactic measures, unspecified: Secondary | ICD-10-CM | POA: Diagnosis not present

## 2019-09-13 DIAGNOSIS — E0590 Thyrotoxicosis, unspecified without thyrotoxic crisis or storm: Secondary | ICD-10-CM | POA: Diagnosis not present

## 2019-09-13 DIAGNOSIS — E1142 Type 2 diabetes mellitus with diabetic polyneuropathy: Secondary | ICD-10-CM | POA: Diagnosis not present

## 2019-09-13 LAB — TSH: TSH: 0.44 (ref <=5.90)

## 2019-09-24 ENCOUNTER — Other Ambulatory Visit (HOSPITAL_COMMUNITY): Payer: Self-pay | Admitting: Cardiology

## 2019-10-02 DIAGNOSIS — I445 Left posterior fascicular block: Secondary | ICD-10-CM | POA: Diagnosis not present

## 2019-10-02 DIAGNOSIS — R0902 Hypoxemia: Secondary | ICD-10-CM | POA: Diagnosis not present

## 2019-10-04 DIAGNOSIS — R339 Retention of urine, unspecified: Secondary | ICD-10-CM | POA: Diagnosis not present

## 2019-11-02 DIAGNOSIS — I445 Left posterior fascicular block: Secondary | ICD-10-CM | POA: Diagnosis not present

## 2019-11-02 DIAGNOSIS — R0902 Hypoxemia: Secondary | ICD-10-CM | POA: Diagnosis not present

## 2019-11-11 DIAGNOSIS — R339 Retention of urine, unspecified: Secondary | ICD-10-CM | POA: Diagnosis not present

## 2019-11-18 ENCOUNTER — Encounter: Payer: Self-pay | Admitting: Family Medicine

## 2019-11-18 ENCOUNTER — Other Ambulatory Visit: Payer: Self-pay

## 2019-11-18 ENCOUNTER — Ambulatory Visit (INDEPENDENT_AMBULATORY_CARE_PROVIDER_SITE_OTHER): Payer: PPO | Admitting: Family Medicine

## 2019-11-18 ENCOUNTER — Ambulatory Visit: Payer: PPO | Admitting: Cardiology

## 2019-11-18 VITALS — BP 108/60 | HR 66 | Ht 68.0 in | Wt 190.2 lb

## 2019-11-18 DIAGNOSIS — I272 Pulmonary hypertension, unspecified: Secondary | ICD-10-CM

## 2019-11-18 DIAGNOSIS — I10 Essential (primary) hypertension: Secondary | ICD-10-CM

## 2019-11-18 DIAGNOSIS — R011 Cardiac murmur, unspecified: Secondary | ICD-10-CM

## 2019-11-18 DIAGNOSIS — N183 Chronic kidney disease, stage 3 unspecified: Secondary | ICD-10-CM | POA: Diagnosis not present

## 2019-11-18 DIAGNOSIS — Z23 Encounter for immunization: Secondary | ICD-10-CM

## 2019-11-18 DIAGNOSIS — I5032 Chronic diastolic (congestive) heart failure: Secondary | ICD-10-CM

## 2019-11-18 DIAGNOSIS — E782 Mixed hyperlipidemia: Secondary | ICD-10-CM | POA: Diagnosis not present

## 2019-11-18 DIAGNOSIS — I4821 Permanent atrial fibrillation: Secondary | ICD-10-CM | POA: Diagnosis not present

## 2019-11-18 NOTE — Patient Instructions (Signed)
Medication Instructions:  Continue all current medications.  Labwork: none  Testing/Procedures:  Your physician has requested that you have an echocardiogram. Echocardiography is a painless test that uses sound waves to create images of your heart. It provides your doctor with information about the size and shape of your heart and how well your heart's chambers and valves are working. This procedure takes approximately one hour. There are no restrictions for this procedure.  Office will contact with results via phone or letter.    Follow-Up: 6 months   Any Other Special Instructions Will Be Listed Below (If Applicable).  If you need a refill on your cardiac medications before your next appointment, please call your pharmacy.

## 2019-11-18 NOTE — Progress Notes (Signed)
Cardiology Office Note  Date: 11/18/2019   ID: Wanda [name], DOB [birth year], MRN [id number]  PCP:  Glenda Chroman, MD  Cardiologist:  Rozann Lesches, MD Electrophysiologist:  None   Chief Complaint: Follow-up permanent atrial fibrillation  History of Present Illness: Wanda [name] is a 79 y.o. female with a history of permanent atrial fibrillation, COPD/former smoker, HTN, HLD, DM 2, pulmonary hypertension, PUD, breast CA status post mastectomy  Last encounter with Dr. Domenic Polite 05/16/2019 via telemedicine. She was reporting regular use of supplemental oxygen. She had lost some weight through diet. There were no major changes in her medications with the exception of no longer being on a diabetic regimen. Her last hemoglobin A1c was 5.6. She did have an interval visit with Dr. Haroldine Laws in January 2021. She did not report any bleeding on Eliquis. Follow-up BMP was ordered. She was continuing to follow with Dr. Haroldine Laws for pulmonary hypertension. 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 was negative for ATTR amyloid. Her last creatinine was 1.85. She was continuing on both Coreg and Cardizem CD for heart rate control along with Eliquis. She was asymptomatic.  She is here for 52-monthfollow-up.  She denies any recent anginal or exertional symptoms, sensation of palpitations or arrhythmias although she is in permanent atrial fibrillation.  Her rate is controlled today at 66.  Blood pressure is well controlled on current therapy.  EKG today with atrial fibrillation rate of 73.  She is on continuous O2 today.  States she is having some issues with odd sensations in her leg.  She denies any claudication-like symptoms.  She states she has neuropathy and has on gabapentin but it does not seem to be relieving the symptoms.  She was previously seen in heart failure clinic for pulmonary hypertension by Dr. BHaroldine Laws1/13/2021.  PAH classified as likely who group 3, PA pressures  not too bad on echo.  NYHA II/III.  Repeat PFTs showed mixed obstruction/restriction with moderately decreased DLCO.  PYP study negative for ATTR amyloid.   Past Medical History:  Diagnosis Date  . B12 deficiency   . Celiac disease   . COPD (chronic obstructive pulmonary disease) ([place]   . Essential hypertension   . Goiter   . History of breast cancer    Status post mastectomy  . History of cardiac catheterization    No significant CAD 2006  . Mixed hyperlipidemia   . Neuropathy   . Paroxysmal atrial fibrillation (HCC)    Coumadin therapy  . PUD (peptic ulcer disease)   . Pulmonary hypertension ([place]   . Type 2 diabetes mellitus ([place]     Past Surgical History:  Procedure Laterality Date  . DILATION AND CURETTAGE OF UTERUS    . HYSTEROSCOPY W/ ENDOMETRIAL ABLATION     ThermaChoice  . MASTECTOMY      Current Outpatient Medications  Medication Sig Dispense Refill  . Alpha-Lipoic Acid 300 MG CAPS Take 1 capsule by mouth 2 (two) times daily.    .Marland KitchenALPRAZolam (XANAX) 1 MG tablet Take 1 mg by mouth at bedtime.     .Marland Kitchenatorvastatin (LIPITOR) 10 MG tablet Take 10 mg by mouth every evening.     .Marland KitchenCARTIA XT 240 MG 24 hr capsule TAKE ONE CAPSULE BY MOUTH DAILY 90 capsule 2  . carvedilol (COREG) 25 MG tablet Take 25 mg by mouth 2 (two) times daily with a meal.     . ELIQUIS 2.5 MG TABS tablet TAKE ONE TABLET BY MOUTH  TWICE DAILY. 60 tablet 6  . furosemide (LASIX) 20 MG tablet TAKE ONE TABLET BY MOUTH EVERY DAY 30 tablet 6  . gabapentin (NEURONTIN) 100 MG capsule Take 100 mg by mouth 3 (three) times daily.     . hydrALAZINE (APRESOLINE) 25 MG tablet TAKE TWO (2) TABLETS BY MOUTH THREE TIMES DAILY 180 tablet 3  . lisinopril (ZESTRIL) 10 MG tablet Take 1 tablet (10 mg total) by mouth daily.    . Multiple Vitamins-Minerals (MULTIVITAMIN GUMMIES ADULT) CHEW Chew 1 each by mouth daily.    Marland Kitchen trimethoprim (TRIMPEX) 100 MG tablet Take 100 mg by mouth at bedtime.    . vitamin B-12  (CYANOCOBALAMIN) 1000 MCG tablet Take 1,000 mcg by mouth daily.    Marland Kitchen glimepiride (AMARYL) 1 MG tablet Take 1 mg by mouth daily.     No current facility-administered medications for this visit.   Allergies:  Gluten, Hydromorphone hcl, and Prednisone   Social History: The patient  reports that she quit smoking about 24 years ago. Her smoking use included cigarettes. She has never used smokeless tobacco. She reports current alcohol use. She reports that she does not use drugs.   Family History: The patient's family history includes Aortic aneurysm in her maternal grandfather; CVA in her maternal grandmother; Celiac disease in her brother; Heart attack in her mother.   ROS:  Please see the history of present illness. Otherwise, complete review of systems is positive for none.  All other systems are reviewed and negative.   Physical Exam: VS:  BP 108/60   Pulse 66   Ht _0  (1.727 m)   Wt 190 lb 3.2 oz (86.3 kg)   SpO2 98% Comment: on 3L O2 via [place]  BMI 28.92 kg/m , BMI Body mass index is 28.92 kg/m.  Wt Readings from Last 3 Encounters:  11/18/19 190 lb 3.2 oz (86.3 kg)  05/16/19 197 lb (89.4 kg)  01/14/19 204 lb 14.4 oz (92.9 kg)    General: Patient appears comfortable at rest. Neck: Supple, no elevated JVP or carotid bruits, no thyromegaly. Lungs: Clear to auscultation, nonlabored breathing at rest. Cardiac: Irregularly irregular rate and rhythm, no S3, positive for 2/3 out of 6 systolic ejection murmur best heard at right upper sternal border, no pericardial rub. Extremities: No pitting edema, distal pulses 2+. Skin: Warm and dry. Musculoskeletal: No kyphosis. Neuropsychiatric: Alert and oriented x3, affect grossly appropriate.  ECG:  An ECG dated 11/18/2019 was personally reviewed today and demonstrated:  Atrial fibrillation with rate of 73  Recent Labwork: 05/31/2019: BUN 20; Creatinine, Ser 1.66; Potassium 4.1; Sodium 141  No results found for: CHOL, TRIG, HDL, CHOLHDL, VLDL,  LDLCALC, LDLDIRECT  Other Studies Reviewed Today:  Echocardiogram 03/08/2018: Study Conclusions   - Left ventricle: The cavity size was normal. Wall thickness was  increased in a pattern of mild LVH. Systolic function was  vigorous. The estimated ejection fraction was in the range of 65%  to 70%. Wall motion was normal; there were no regional wall  motion abnormalities. The study is not technically sufficient to  allow evaluation of LV diastolic function.  - Aortic valve: Moderately calcified annulus. Moderately thickened  leaflets. There was mild stenosis. Mean gradient (S): 13 mm Hg.  Valve area (VTI): 1.61 cm^2. Valve area (Vmax): 1.74 cm^2. Valve  area (Vmean): 1.74 cm^2.  - Mitral valve: There was mild regurgitation.  - Left atrium: The atrium was severely dilated.  - Right ventricle: The cavity size was mildly dilated.  -  Right atrium: The atrium was severely dilated.  - Technically difficult study.  Assessment and Plan:  1. Pulmonary hypertension ([place])   2. Chronic diastolic heart failure (HCC)   3. Stage 3 chronic kidney disease, unspecified whether stage 3a or 3b CKD ([place])   4. Permanent atrial fibrillation ([place])   5. Murmur, cardiac   6. Mixed hyperlipidemia    1. Pulmonary hypertension ([place]) Patient is seen by Dr. Haroldine Laws in heart failure clinic for her pulmonary hypertension.  Last visit with Dr. Haroldine Laws was 02/13/2019.  Stated PAH was likely WHO group 3.  Pulmonary pressures were not bad on echo.  Repeat PFTs showed mixed obstruction/restriction with moderately decreased DLCO.  She continued on O2.  Dr. Haroldine Laws was going to check on home sleep study.  If not covered stated he could order in lab study at [facility].  2. Chronic diastolic heart failure ([place]) Echocardiogram last year in January 2020 EF 65 to 70%.  No WMA's, study not technically sufficient to evaluate LV diastolic function.  Mild aortic stenosis, mild MR severe LA dilation,  severe RA dilation.  Continue Coreg 25 mg p.o. twice daily.  Continue lisinopril 10 mg daily.  Continue Lasix 20 mg daily.    3. Stage 3 chronic kidney disease, unspecified whether stage 3a or 3b CKD ([place]) Recent renal function labs 5 months ago on 05/31/2019: Creatinine 1.66, GFR 29.  4. Permanent atrial fibrillation ([place]) Continues in atrial fibrillation with controlled rate.  EKG today shows atrial fibrillation with a rate of 73.  Continue Cartier XT 240 mg daily.  Continue Eliquis 2.5 mg p.o. twice daily.  5. Murmur, cardiac  On exam today she has a somewhat prominent systolic murmur best heard at right upper sternal border.  We will order a repeat echocardiogram to reassess valvular function.  6.  Hyperlipidemia Continue atorvastatin 10 mg daily.  7.  Hypertension Blood pressure well controlled today at 108/60. Continue carvedilol 25 mg p.o. twice daily.  Continue hydralazine 50 mg p.o. 3 times daily.  Lisinopril 10 mg daily.  Medication Adjustments/Labs and Tests Ordered: Current medicines are reviewed at length with the patient today.  Concerns regarding medicines are outlined above.   Disposition: Follow-up with Dr. Domenic Polite or APP 6 months  Signed, Levell July, NP 11/18/2019 4:27 PM    [facility] [place], [place], [place] [address] Phone: [phone]; Fax: [phone]

## 2019-12-02 DIAGNOSIS — I445 Left posterior fascicular block: Secondary | ICD-10-CM | POA: Diagnosis not present

## 2019-12-02 DIAGNOSIS — R0902 Hypoxemia: Secondary | ICD-10-CM | POA: Diagnosis not present

## 2019-12-08 ENCOUNTER — Ambulatory Visit (INDEPENDENT_AMBULATORY_CARE_PROVIDER_SITE_OTHER): Payer: PPO

## 2019-12-08 ENCOUNTER — Telehealth: Payer: Self-pay | Admitting: *Deleted

## 2019-12-08 DIAGNOSIS — R011 Cardiac murmur, unspecified: Secondary | ICD-10-CM | POA: Diagnosis not present

## 2019-12-08 LAB — ECHOCARDIOGRAM COMPLETE
Area-P 1/2: 4.23 cm2
Calc EF: 61.5 %
MV M vel: 4.17 m/s
MV Peak grad: 69.5 mmHg
S' Lateral: 2.36 cm
Single Plane A2C EF: 62.6 %
Single Plane A4C EF: 59.1 %

## 2019-12-08 NOTE — Telephone Encounter (Signed)
Patient informed. Copy sent to PCP °

## 2019-12-08 NOTE — Telephone Encounter (Signed)
-----  Message from Verta Ellen., NP sent at 12/08/2019  3:43 PM EDT ----- Please call the patient and let her know the pumping function of her heart is good with EF of 55 to 60%.  She has some mild LVH (unchanged from last echo).  Some moderate pulmonary hypertension with right ventricular systolic pressure at 55.7 mmHg (This has improved some since last echo when it was 50 mmHg),  Mild to moderate mitral valve regurgitation (this is a little better from previous echo in January 2020).  Thank you

## 2019-12-09 DIAGNOSIS — I129 Hypertensive chronic kidney disease with stage 1 through stage 4 chronic kidney disease, or unspecified chronic kidney disease: Secondary | ICD-10-CM | POA: Diagnosis not present

## 2019-12-09 DIAGNOSIS — E1122 Type 2 diabetes mellitus with diabetic chronic kidney disease: Secondary | ICD-10-CM | POA: Diagnosis not present

## 2019-12-09 DIAGNOSIS — N184 Chronic kidney disease, stage 4 (severe): Secondary | ICD-10-CM | POA: Diagnosis not present

## 2019-12-09 DIAGNOSIS — E7849 Other hyperlipidemia: Secondary | ICD-10-CM | POA: Diagnosis not present

## 2019-12-16 DIAGNOSIS — R339 Retention of urine, unspecified: Secondary | ICD-10-CM | POA: Diagnosis not present

## 2019-12-21 DIAGNOSIS — I10 Essential (primary) hypertension: Secondary | ICD-10-CM | POA: Diagnosis not present

## 2019-12-21 DIAGNOSIS — E1142 Type 2 diabetes mellitus with diabetic polyneuropathy: Secondary | ICD-10-CM | POA: Diagnosis not present

## 2019-12-21 DIAGNOSIS — E1165 Type 2 diabetes mellitus with hyperglycemia: Secondary | ICD-10-CM | POA: Diagnosis not present

## 2019-12-21 DIAGNOSIS — E1122 Type 2 diabetes mellitus with diabetic chronic kidney disease: Secondary | ICD-10-CM | POA: Diagnosis not present

## 2019-12-21 DIAGNOSIS — I509 Heart failure, unspecified: Secondary | ICD-10-CM | POA: Diagnosis not present

## 2019-12-21 DIAGNOSIS — Z299 Encounter for prophylactic measures, unspecified: Secondary | ICD-10-CM | POA: Diagnosis not present

## 2019-12-22 ENCOUNTER — Other Ambulatory Visit: Payer: Self-pay | Admitting: Cardiology

## 2020-01-02 DIAGNOSIS — R0902 Hypoxemia: Secondary | ICD-10-CM | POA: Diagnosis not present

## 2020-01-02 DIAGNOSIS — I445 Left posterior fascicular block: Secondary | ICD-10-CM | POA: Diagnosis not present

## 2020-01-23 ENCOUNTER — Other Ambulatory Visit (HOSPITAL_COMMUNITY): Payer: Self-pay | Admitting: Cardiology

## 2020-01-27 DIAGNOSIS — R339 Retention of urine, unspecified: Secondary | ICD-10-CM | POA: Diagnosis not present

## 2020-02-01 DIAGNOSIS — R0902 Hypoxemia: Secondary | ICD-10-CM | POA: Diagnosis not present

## 2020-02-01 DIAGNOSIS — I445 Left posterior fascicular block: Secondary | ICD-10-CM | POA: Diagnosis not present

## 2020-02-02 ENCOUNTER — Other Ambulatory Visit: Payer: Self-pay | Admitting: Cardiology

## 2020-02-17 DIAGNOSIS — E1122 Type 2 diabetes mellitus with diabetic chronic kidney disease: Secondary | ICD-10-CM | POA: Diagnosis not present

## 2020-02-17 DIAGNOSIS — E1142 Type 2 diabetes mellitus with diabetic polyneuropathy: Secondary | ICD-10-CM | POA: Diagnosis not present

## 2020-02-17 DIAGNOSIS — Z299 Encounter for prophylactic measures, unspecified: Secondary | ICD-10-CM | POA: Diagnosis not present

## 2020-02-17 DIAGNOSIS — I10 Essential (primary) hypertension: Secondary | ICD-10-CM | POA: Diagnosis not present

## 2020-02-17 DIAGNOSIS — E1165 Type 2 diabetes mellitus with hyperglycemia: Secondary | ICD-10-CM | POA: Diagnosis not present

## 2020-02-21 ENCOUNTER — Encounter: Payer: Self-pay | Admitting: Neurology

## 2020-03-03 DIAGNOSIS — I445 Left posterior fascicular block: Secondary | ICD-10-CM | POA: Diagnosis not present

## 2020-03-03 DIAGNOSIS — R0902 Hypoxemia: Secondary | ICD-10-CM | POA: Diagnosis not present

## 2020-03-29 DIAGNOSIS — N184 Chronic kidney disease, stage 4 (severe): Secondary | ICD-10-CM | POA: Diagnosis not present

## 2020-03-29 DIAGNOSIS — I10 Essential (primary) hypertension: Secondary | ICD-10-CM | POA: Diagnosis not present

## 2020-03-29 DIAGNOSIS — E1165 Type 2 diabetes mellitus with hyperglycemia: Secondary | ICD-10-CM | POA: Diagnosis not present

## 2020-03-29 DIAGNOSIS — J9611 Chronic respiratory failure with hypoxia: Secondary | ICD-10-CM | POA: Diagnosis not present

## 2020-03-29 DIAGNOSIS — E119 Type 2 diabetes mellitus without complications: Secondary | ICD-10-CM | POA: Diagnosis not present

## 2020-03-29 DIAGNOSIS — E1122 Type 2 diabetes mellitus with diabetic chronic kidney disease: Secondary | ICD-10-CM | POA: Diagnosis not present

## 2020-03-29 DIAGNOSIS — Z299 Encounter for prophylactic measures, unspecified: Secondary | ICD-10-CM | POA: Diagnosis not present

## 2020-04-02 DIAGNOSIS — M545 Low back pain, unspecified: Secondary | ICD-10-CM | POA: Diagnosis not present

## 2020-04-03 DIAGNOSIS — I445 Left posterior fascicular block: Secondary | ICD-10-CM | POA: Diagnosis not present

## 2020-04-03 DIAGNOSIS — R0902 Hypoxemia: Secondary | ICD-10-CM | POA: Diagnosis not present

## 2020-04-09 DIAGNOSIS — I10 Essential (primary) hypertension: Secondary | ICD-10-CM | POA: Diagnosis not present

## 2020-04-09 DIAGNOSIS — J309 Allergic rhinitis, unspecified: Secondary | ICD-10-CM | POA: Diagnosis not present

## 2020-04-09 DIAGNOSIS — N184 Chronic kidney disease, stage 4 (severe): Secondary | ICD-10-CM | POA: Diagnosis not present

## 2020-04-09 DIAGNOSIS — E1122 Type 2 diabetes mellitus with diabetic chronic kidney disease: Secondary | ICD-10-CM | POA: Diagnosis not present

## 2020-04-09 DIAGNOSIS — E1165 Type 2 diabetes mellitus with hyperglycemia: Secondary | ICD-10-CM | POA: Diagnosis not present

## 2020-04-09 DIAGNOSIS — J449 Chronic obstructive pulmonary disease, unspecified: Secondary | ICD-10-CM | POA: Diagnosis not present

## 2020-04-09 DIAGNOSIS — Z299 Encounter for prophylactic measures, unspecified: Secondary | ICD-10-CM | POA: Diagnosis not present

## 2020-04-12 ENCOUNTER — Other Ambulatory Visit (HOSPITAL_COMMUNITY): Payer: Self-pay | Admitting: Internal Medicine

## 2020-04-12 ENCOUNTER — Other Ambulatory Visit: Payer: Self-pay | Admitting: Internal Medicine

## 2020-04-12 DIAGNOSIS — M48061 Spinal stenosis, lumbar region without neurogenic claudication: Secondary | ICD-10-CM

## 2020-04-12 DIAGNOSIS — M545 Low back pain, unspecified: Secondary | ICD-10-CM

## 2020-04-16 DIAGNOSIS — R339 Retention of urine, unspecified: Secondary | ICD-10-CM | POA: Diagnosis not present

## 2020-04-24 ENCOUNTER — Ambulatory Visit (HOSPITAL_COMMUNITY)
Admission: RE | Admit: 2020-04-24 | Discharge: 2020-04-24 | Disposition: A | Discharge status: Home / Self Care | Payer: PPO | Source: Ambulatory Visit | Attending: Internal Medicine | Admitting: Internal Medicine

## 2020-04-24 ENCOUNTER — Other Ambulatory Visit: Payer: Self-pay

## 2020-04-24 DIAGNOSIS — M545 Low back pain, unspecified: Secondary | ICD-10-CM | POA: Diagnosis not present

## 2020-04-24 DIAGNOSIS — M48061 Spinal stenosis, lumbar region without neurogenic claudication: Secondary | ICD-10-CM | POA: Diagnosis not present

## 2020-05-01 DIAGNOSIS — I445 Left posterior fascicular block: Secondary | ICD-10-CM | POA: Diagnosis not present

## 2020-05-01 DIAGNOSIS — R0902 Hypoxemia: Secondary | ICD-10-CM | POA: Diagnosis not present

## 2020-05-09 DIAGNOSIS — I10 Essential (primary) hypertension: Secondary | ICD-10-CM | POA: Diagnosis not present

## 2020-05-11 ENCOUNTER — Ambulatory Visit: Payer: PPO | Admitting: Neurology

## 2020-05-11 ENCOUNTER — Other Ambulatory Visit: Payer: Self-pay

## 2020-05-11 ENCOUNTER — Encounter: Payer: Self-pay | Admitting: Neurology

## 2020-05-11 VITALS — BP 99/47 | HR 76 | Ht 68.0 in | Wt 185.0 lb

## 2020-05-11 DIAGNOSIS — E1142 Type 2 diabetes mellitus with diabetic polyneuropathy: Secondary | ICD-10-CM

## 2020-05-11 DIAGNOSIS — M47816 Spondylosis without myelopathy or radiculopathy, lumbar region: Secondary | ICD-10-CM

## 2020-05-11 DIAGNOSIS — R292 Abnormal reflex: Secondary | ICD-10-CM

## 2020-05-11 MED ORDER — TIZANIDINE HCL 2 MG PO TABS: 2.0000 mg | ORAL_TABLET | Freq: Every day | ORAL | 3 refills | Status: DC

## 2020-05-11 NOTE — Progress Notes (Signed)
[facility] Clinic Note - Initial Visit   Date: 05/11/20  Wanda [name] MRN: [id number] DOB: [birth year]   Dear Dr. Woody Seller:  Thank you for your kind referral of Wanda [name] for consultation of bilateral leg numbness. Although her history is well known to you, please allow Wanda [name] to reiterate it for the purpose of our medical record. The patient was accompanied to the clinic by son who also provides collateral information.     History of Present Illness: Wanda [name] is a 80 y.o. right-handed [place] female with atrial fibrillation, type 2 diabetes mellitus, COPD on oxygen (2-3L), pulmonary hypertension, hypertension, history of breast cancer s/p left mastectomy presenting for evaluation of bilateral leg numbness.  She is retired Marine scientist.   For the past 2 years, she has numbness and tight sensation of the feet, legs, and thighs.  Symptoms are constant.  Nothing which exacerbates or alleviates her symptoms.  She denies difficulty with walking and symptoms are not worse with prolonged standing or walking.  She endorses leg cramps.  No burning pain or tingling, leg weakness, or low back pain.  She walks unassisted.  Her diabetes has been well-controlled and now she is off medication. She takes gabapentin 139m three times daily and does not notice any benefit. 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  There is moderate anterolisthesis at L4-5.  Out-side paper records, electronic medical record, and images have been reviewed where available and summarized as:  MRI lumbar spine wo contrast 04/24/2020: 1. L4-5: Chronic bilateral pars defects with 11-12 mm of anterolisthesis. Chronic disc degeneration. Bilateral foraminal stenosis that could affect either or both L4 nerves. 2. L5-S1: Right foraminal narrowing because of osteophyte and bulging disc material could  possibly affect the exiting L5 nerve. 3. Lesser, grossly non-compressive degenerative changes at L3-4. 4. L2-3: Disc bulge. Facet and ligamentous hypertrophy. Bilateral lateral recess narrowing that could possibly be symptomatic.  Lab Results  Component Value Date   HGBA1C 5.9 (H) 10/02/2018   No results found for: VITAMINB12 Lab Results  Component Value Date   TSH 0.380 10/02/2018   No results found for: ESRSEDRATE, POCTSEDRATE  Past Medical History:  Diagnosis Date  . B12 deficiency   . Celiac disease   . COPD (chronic obstructive pulmonary disease) ([place]   . Essential hypertension   . Goiter   . History of breast cancer    Status post mastectomy  . History of cardiac catheterization    No significant CAD 2006  . Mixed hyperlipidemia   . Neuropathy   . Paroxysmal atrial fibrillation (HCC)    Coumadin therapy  . PUD (peptic ulcer disease)   . Pulmonary hypertension ([place]   . Type 2 diabetes mellitus ([place]     Past Surgical History:  Procedure Laterality Date  . DILATION AND CURETTAGE OF UTERUS    . HYSTEROSCOPY W/ ENDOMETRIAL ABLATION     ThermaChoice  . MASTECTOMY       Medications:  Outpatient Encounter Medications as of 05/11/2020  Medication Sig  . Alpha-Lipoic Acid 300 MG CAPS Take 1 capsule by mouth 2 (two) times daily.  .Marland KitchenALPRAZolam (XANAX) 1 MG tablet Take 1 mg by mouth at bedtime.   .Marland Kitchenatorvastatin (LIPITOR) 10 MG tablet Take 10 mg by mouth every evening.   .Marland KitchenCARTIA XT 240 MG 24 hr capsule TAKE ONE CAPSULE BY MOUTH DAILY  . carvedilol (  COREG) 25 MG tablet Take 25 mg by mouth 2 (two) times daily with a meal.   . ELIQUIS 2.5 MG TABS tablet TAKE ONE TABLET BY MOUTH TWICE DAILY  . furosemide (LASIX) 20 MG tablet TAKE ONE TABLET BY MOUTH EVERY DAY  . gabapentin (NEURONTIN) 100 MG capsule Take 100 mg by mouth 3 (three) times daily.   Marland Kitchen glimepiride (AMARYL) 1 MG tablet Take 1 mg by mouth daily.  . hydrALAZINE (APRESOLINE) 25 MG tablet TAKE TWO (2) TABLETS BY  MOUTH THREE TIMES DAILY  . lisinopril (ZESTRIL) 10 MG tablet Take 1 tablet (10 mg total) by mouth daily.  . Multiple Vitamins-Minerals (MULTIVITAMIN GUMMIES ADULT) CHEW Chew 1 each by mouth daily.  Marland Kitchen trimethoprim (TRIMPEX) 100 MG tablet Take 100 mg by mouth at bedtime.  . vitamin B-12 (CYANOCOBALAMIN) 1000 MCG tablet Take 1,000 mcg by mouth daily.   No facility-administered encounter medications on file as of 05/11/2020.    Allergies:  Allergies  Allergen Reactions  . Gluten Diarrhea  . Hydromorphone Hcl Other (See Comments)    REACTION: lowers BP levels  . Prednisone Other (See Comments)    DOES NOT LIKE SIDE EFFECTS, INTERACTION WITH WARFARIN    Family History: Family History  Problem Relation Age of Onset  . Heart attack Mother   . Celiac disease Brother   . CVA Maternal Grandmother   . Aortic aneurysm Maternal Grandfather     Social History: Social History   Tobacco Use  . Smoking status: Former Smoker    Types: Cigarettes    Quit date: 02/11/1995    Years since quitting: 25.2  . Smokeless tobacco: Never Used  Vaping Use  . Vaping Use: Never used  Substance Use Topics  . Alcohol use: Yes    Alcohol/week: 0.0 standard drinks    Comment: occ  . Drug use: No   Social History   Social History Narrative   Has a EMS son named Social worker   Lives in one story home    Retired Marine scientist    Drinks 1 cup of coffee a day     Vital Signs:  BP (!) 99/47   Pulse 76   Ht _0  (1.727 m)   Wt 185 lb (83.9 kg)   SpO2 98%   BMI 28.13 kg/m    Neurological Exam: MENTAL STATUS including orientation to time, place, person, recent and remote memory, attention span and concentration, language, and fund of knowledge is normal.  Speech is not dysarthric.  CRANIAL NERVES: II:  No visual field defects.  .   III-IV-VI: Pupils equal round and reactive to light.  Normal conjugate, extra-ocular eye movements in all directions of gaze.  No nystagmus.  No ptosis.   V:   Normal facial sensation.    VII:  Normal facial symmetry and movements.   VIII:  Normal hearing and vestibular function.   IX-X:  Normal palatal movement.   XI:  Normal shoulder shrug and head rotation.   XII:  Normal tongue strength and range of motion, no deviation or fasciculation.  MOTOR:  No atrophy, fasciculations or abnormal movements.  No pronator drift.   Upper Extremity:  Right  Left  Deltoid  5/5   5/5   Biceps  5/5   5/5   Triceps  5/5   5/5   Infraspinatus 5/5  5/5  Medial pectoralis 5/5  5/5  Wrist extensors  5/5   5/5   Wrist flexors  5/5  5/5   Finger extensors  5/5   5/5   Finger flexors  5/5   5/5   Dorsal interossei  5/5   5/5   Abductor pollicis  5/5   5/5   Tone (Ashworth scale)  0  0   Lower Extremity:  Right  Left  Hip flexors  5/5   5/5   Hip extensors  5/5   5/5   Adductor 5/5  5/5  Abductor 5/5  5/5  Knee flexors  5/5   5/5   Knee extensors  5/5   5/5   Dorsiflexors  5/5   5/5   Plantarflexors  5/5   5/5   Toe extensors  5/5   5/5   Toe flexors  5/5   5/5   Tone (Ashworth scale)  0  0   MSRs:  Right        Left                  brachioradialis 2+  2+  biceps 2+  2+  triceps 2+  2+  patellar 3+  3+  ankle jerk 2+  2+  Hoffman no  no  plantar response down  down   SENSORY:  Reduced vibration at the ankles, absent at the toes.  Temperature and pin prick reduced from mild-calf down.   COORDINATION/GAIT: Normal finger-to- nose-finger.  Intact rapid alternating movements bilaterally.  Able to rise from a chair without using arms.  Gait narrow based and stable, unassisted.   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 Exam shows hyperreflexia in the legs. She does not have severe canal stenosis in the lumbar region or symptoms of neurogenic claudication.    - To be sure there is no lesion higher up in the cord, I will get MRI thoracic spine wo contrast.   - For muscle spasms,  start tizanidine 65m at bedtime.   - Start PT for low back strengthening  Distal leg paresthesias secondary to peripheral neuropathy.  -  NCS/EMG bilateral legs will be ordered to characterize the nature of her neuropathy (axonal vs demyelinating) given her progressive symptoms over the past few months.   - She does not have pain, so I recommend stopping gabapentin.   Further recommendations pending results.    Thank you for allowing me to participate in patient's care.  If I can answer any additional questions, I would be pleased to do so.    Sincerely,    Arlena Marsan K. PPosey Pronto DO

## 2020-05-11 NOTE — Patient Instructions (Addendum)
1.  Stop gabapentin 2.  Start tizandine 42m at bedtime for muscle cramps 3.  MRI thoracic spine without contrast 4.  Nerve testing of the legs 5.  Start physical therapy for low back strengthening  ELECTROMYOGRAM AND NERVE CONDUCTION STUDIES (EMG/NCS) INSTRUCTIONS  How to Prepare The neurologist conducting the EMG will need to know if you have certain medical conditions. Tell the neurologist and other EMG lab personnel if you: . Have a pacemaker or any other electrical medical device . Take blood-thinning medications . Have hemophilia, a blood-clotting disorder that causes prolonged bleeding Bathing Take a shower or bath shortly before your exam in order to remove oils from your skin. Don't apply lotions or creams before the exam.  What to Expect You'll likely be asked to change into a hospital gown for the procedure and lie down on an examination table. The following explanations can help you understand what will happen during the exam.  . Electrodes. The neurologist or a technician places surface electrodes at various locations on your skin depending on where you're experiencing symptoms. Or the neurologist may insert needle electrodes at different sites depending on your symptoms.  . Sensations. The electrodes will at times transmit a tiny electrical current that you may feel as a twinge or spasm. The needle electrode may cause discomfort or pain that usually ends shortly after the needle is removed. If you are concerned about discomfort or pain, you may want to talk to the neurologist about taking a short break during the exam.  . Instructions. During the needle EMG, the neurologist will assess whether there is any spontaneous electrical activity when the muscle is at rest - activity that isn't present in healthy muscle tissue - and the degree of activity when you slightly contract the muscle.  He or she will give you instructions on resting and contracting a muscle at appropriate times.  Depending on what muscles and nerves the neurologist is examining, he or she may ask you to change positions during the exam.  After your EMG You may experience some temporary, minor bruising where the needle electrode was inserted into your muscle. This bruising should fade within several days. If it persists, contact your primary care doctor.

## 2020-05-14 ENCOUNTER — Telehealth: Payer: Self-pay | Admitting: Neurology

## 2020-05-14 ENCOUNTER — Other Ambulatory Visit: Payer: Self-pay

## 2020-05-14 DIAGNOSIS — M47816 Spondylosis without myelopathy or radiculopathy, lumbar region: Secondary | ICD-10-CM

## 2020-05-14 NOTE — Telephone Encounter (Signed)
OK to send PT order as requested for low back strengthening.

## 2020-05-14 NOTE — Telephone Encounter (Signed)
Patient wants to have her PT done at [facility] in [place]  Please send the order there

## 2020-05-16 DIAGNOSIS — R339 Retention of urine, unspecified: Secondary | ICD-10-CM | POA: Diagnosis not present

## 2020-05-22 ENCOUNTER — Other Ambulatory Visit (HOSPITAL_COMMUNITY): Payer: Self-pay | Admitting: Cardiology

## 2020-05-29 DIAGNOSIS — M47816 Spondylosis without myelopathy or radiculopathy, lumbar region: Secondary | ICD-10-CM | POA: Diagnosis not present

## 2020-05-31 DIAGNOSIS — M47816 Spondylosis without myelopathy or radiculopathy, lumbar region: Secondary | ICD-10-CM | POA: Diagnosis not present

## 2020-06-01 DIAGNOSIS — R0902 Hypoxemia: Secondary | ICD-10-CM | POA: Diagnosis not present

## 2020-06-01 DIAGNOSIS — I445 Left posterior fascicular block: Secondary | ICD-10-CM | POA: Diagnosis not present

## 2020-06-04 DIAGNOSIS — M47816 Spondylosis without myelopathy or radiculopathy, lumbar region: Secondary | ICD-10-CM | POA: Diagnosis not present

## 2020-06-07 DIAGNOSIS — M47816 Spondylosis without myelopathy or radiculopathy, lumbar region: Secondary | ICD-10-CM | POA: Diagnosis not present

## 2020-06-11 DIAGNOSIS — M47816 Spondylosis without myelopathy or radiculopathy, lumbar region: Secondary | ICD-10-CM | POA: Diagnosis not present

## 2020-06-11 DIAGNOSIS — R339 Retention of urine, unspecified: Secondary | ICD-10-CM | POA: Diagnosis not present

## 2020-06-11 IMAGING — DX DG CHEST 2V
2 series · 2 of 2 positions shown · non-contrast
Comparison: Radiographs August 21, 2017.

CLINICAL DATA: Shortness of breath, pulmonary hypertension.

EXAM:
CHEST - 2 VIEW

[chest pa]
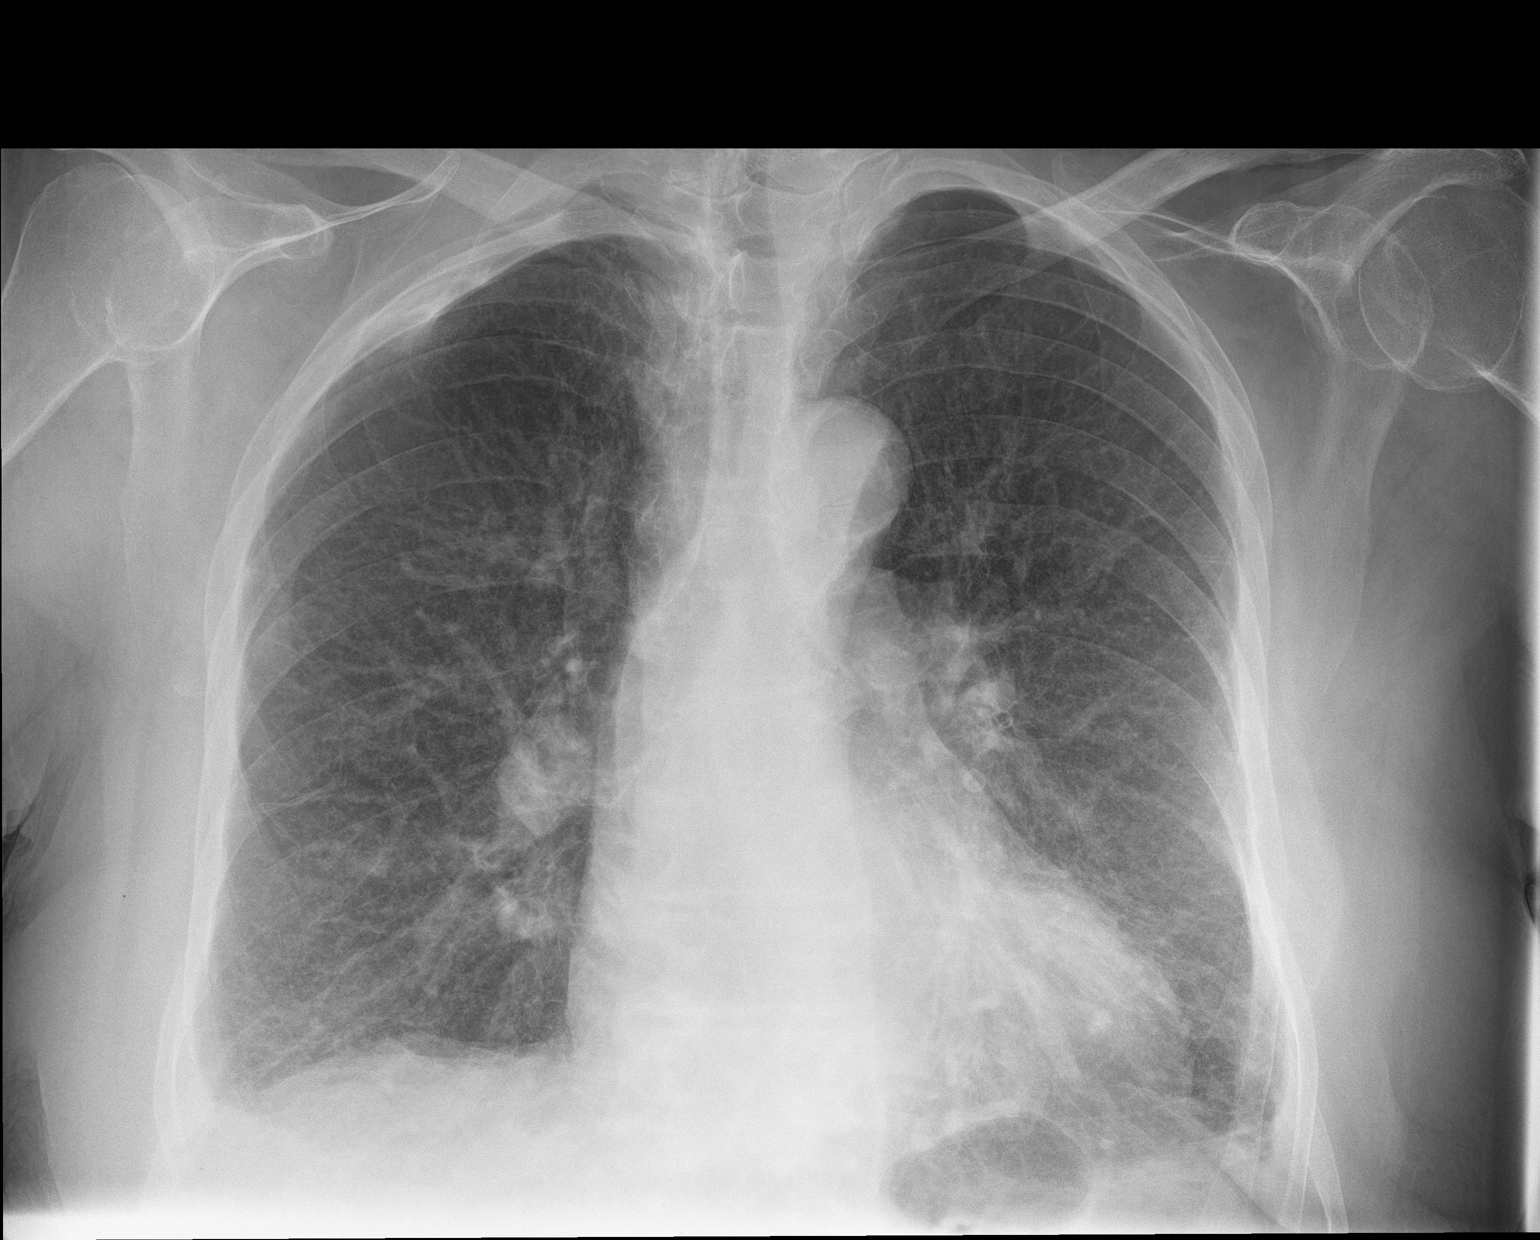

[chest lat]
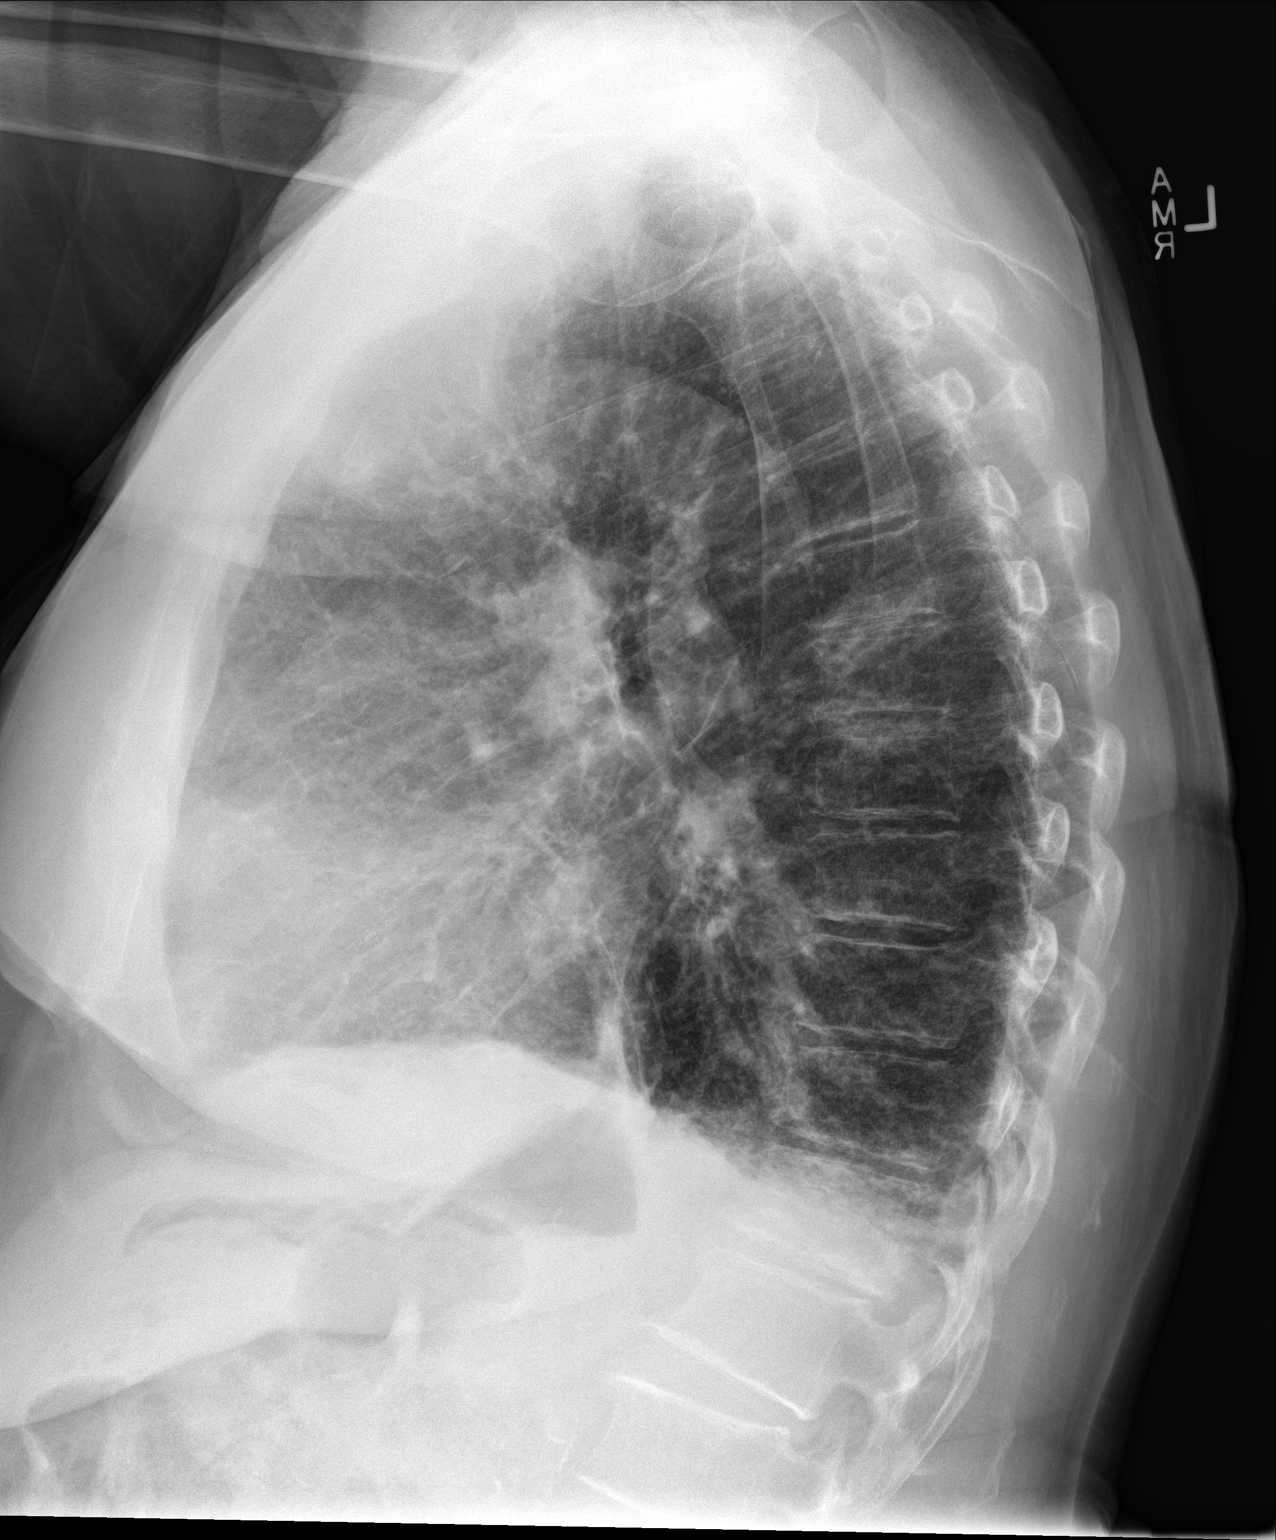

[2 of 2 positions shown; findings below may reference images not displayed]

FINDINGS: Stable cardiomediastinal silhouette. No pneumothorax is noted. Mild
bibasilar subsegmental atelectasis or possibly edema is noted with
minimal pleural effusions. Bony thorax is unremarkable.
IMPRESSION: Mild bibasilar subsegmental atelectasis or possibly edema with
minimal pleural effusions.

## 2020-06-14 DIAGNOSIS — M47816 Spondylosis without myelopathy or radiculopathy, lumbar region: Secondary | ICD-10-CM | POA: Diagnosis not present

## 2020-06-18 DIAGNOSIS — M47816 Spondylosis without myelopathy or radiculopathy, lumbar region: Secondary | ICD-10-CM | POA: Diagnosis not present

## 2020-06-21 ENCOUNTER — Other Ambulatory Visit (HOSPITAL_COMMUNITY): Payer: Self-pay | Admitting: Cardiology

## 2020-06-21 DIAGNOSIS — M47816 Spondylosis without myelopathy or radiculopathy, lumbar region: Secondary | ICD-10-CM | POA: Diagnosis not present

## 2020-07-01 DIAGNOSIS — R0902 Hypoxemia: Secondary | ICD-10-CM | POA: Diagnosis not present

## 2020-07-01 DIAGNOSIS — I445 Left posterior fascicular block: Secondary | ICD-10-CM | POA: Diagnosis not present

## 2020-07-02 DIAGNOSIS — E1165 Type 2 diabetes mellitus with hyperglycemia: Secondary | ICD-10-CM | POA: Diagnosis not present

## 2020-07-02 DIAGNOSIS — E1122 Type 2 diabetes mellitus with diabetic chronic kidney disease: Secondary | ICD-10-CM | POA: Diagnosis not present

## 2020-07-02 DIAGNOSIS — I10 Essential (primary) hypertension: Secondary | ICD-10-CM | POA: Diagnosis not present

## 2020-07-02 DIAGNOSIS — D692 Other nonthrombocytopenic purpura: Secondary | ICD-10-CM | POA: Diagnosis not present

## 2020-07-02 DIAGNOSIS — N184 Chronic kidney disease, stage 4 (severe): Secondary | ICD-10-CM | POA: Diagnosis not present

## 2020-07-02 DIAGNOSIS — Z299 Encounter for prophylactic measures, unspecified: Secondary | ICD-10-CM | POA: Diagnosis not present

## 2020-07-09 DIAGNOSIS — E7849 Other hyperlipidemia: Secondary | ICD-10-CM | POA: Diagnosis not present

## 2020-07-09 DIAGNOSIS — N184 Chronic kidney disease, stage 4 (severe): Secondary | ICD-10-CM | POA: Diagnosis not present

## 2020-07-09 DIAGNOSIS — I129 Hypertensive chronic kidney disease with stage 1 through stage 4 chronic kidney disease, or unspecified chronic kidney disease: Secondary | ICD-10-CM | POA: Diagnosis not present

## 2020-07-09 DIAGNOSIS — E1122 Type 2 diabetes mellitus with diabetic chronic kidney disease: Secondary | ICD-10-CM | POA: Diagnosis not present

## 2020-07-10 ENCOUNTER — Other Ambulatory Visit (HOSPITAL_COMMUNITY): Payer: Self-pay | Admitting: Cardiology

## 2020-07-11 DIAGNOSIS — R339 Retention of urine, unspecified: Secondary | ICD-10-CM | POA: Diagnosis not present

## 2020-07-13 ENCOUNTER — Ambulatory Visit (HOSPITAL_COMMUNITY)
Admission: RE | Admit: 2020-07-13 | Discharge: 2020-07-13 | Disposition: A | Discharge status: Home / Self Care | Payer: PPO | Source: Ambulatory Visit | Attending: Neurology | Admitting: Neurology

## 2020-07-13 DIAGNOSIS — R292 Abnormal reflex: Secondary | ICD-10-CM

## 2020-07-13 DIAGNOSIS — Q059 Spina bifida, unspecified: Secondary | ICD-10-CM | POA: Diagnosis not present

## 2020-07-13 DIAGNOSIS — M47816 Spondylosis without myelopathy or radiculopathy, lumbar region: Secondary | ICD-10-CM | POA: Insufficient documentation

## 2020-07-13 DIAGNOSIS — M4319 Spondylolisthesis, multiple sites in spine: Secondary | ICD-10-CM | POA: Diagnosis not present

## 2020-07-13 DIAGNOSIS — E1142 Type 2 diabetes mellitus with diabetic polyneuropathy: Secondary | ICD-10-CM | POA: Insufficient documentation

## 2020-07-13 DIAGNOSIS — Q064 Hydromyelia: Secondary | ICD-10-CM | POA: Diagnosis not present

## 2020-07-13 DIAGNOSIS — D1809 Hemangioma of other sites: Secondary | ICD-10-CM | POA: Diagnosis not present

## 2020-07-17 ENCOUNTER — Encounter: Payer: PPO | Admitting: Neurology

## 2020-07-18 DIAGNOSIS — I4821 Permanent atrial fibrillation: Secondary | ICD-10-CM | POA: Diagnosis not present

## 2020-07-18 DIAGNOSIS — Z299 Encounter for prophylactic measures, unspecified: Secondary | ICD-10-CM | POA: Diagnosis not present

## 2020-07-18 DIAGNOSIS — F339 Major depressive disorder, recurrent, unspecified: Secondary | ICD-10-CM | POA: Diagnosis not present

## 2020-07-18 DIAGNOSIS — I10 Essential (primary) hypertension: Secondary | ICD-10-CM | POA: Diagnosis not present

## 2020-07-18 DIAGNOSIS — F419 Anxiety disorder, unspecified: Secondary | ICD-10-CM | POA: Diagnosis not present

## 2020-07-19 ENCOUNTER — Other Ambulatory Visit: Payer: Self-pay | Admitting: Cardiology

## 2020-07-19 IMAGING — DX DG CHEST 2V
3 series · 3 of 3 positions shown · non-contrast
Comparison: 03/08/2018, 08/21/2017

CLINICAL DATA: Blood in right eye, pulmonary hypertension COPD

EXAM:
CHEST - 2 VIEW

[chest lat (1 of 2)]
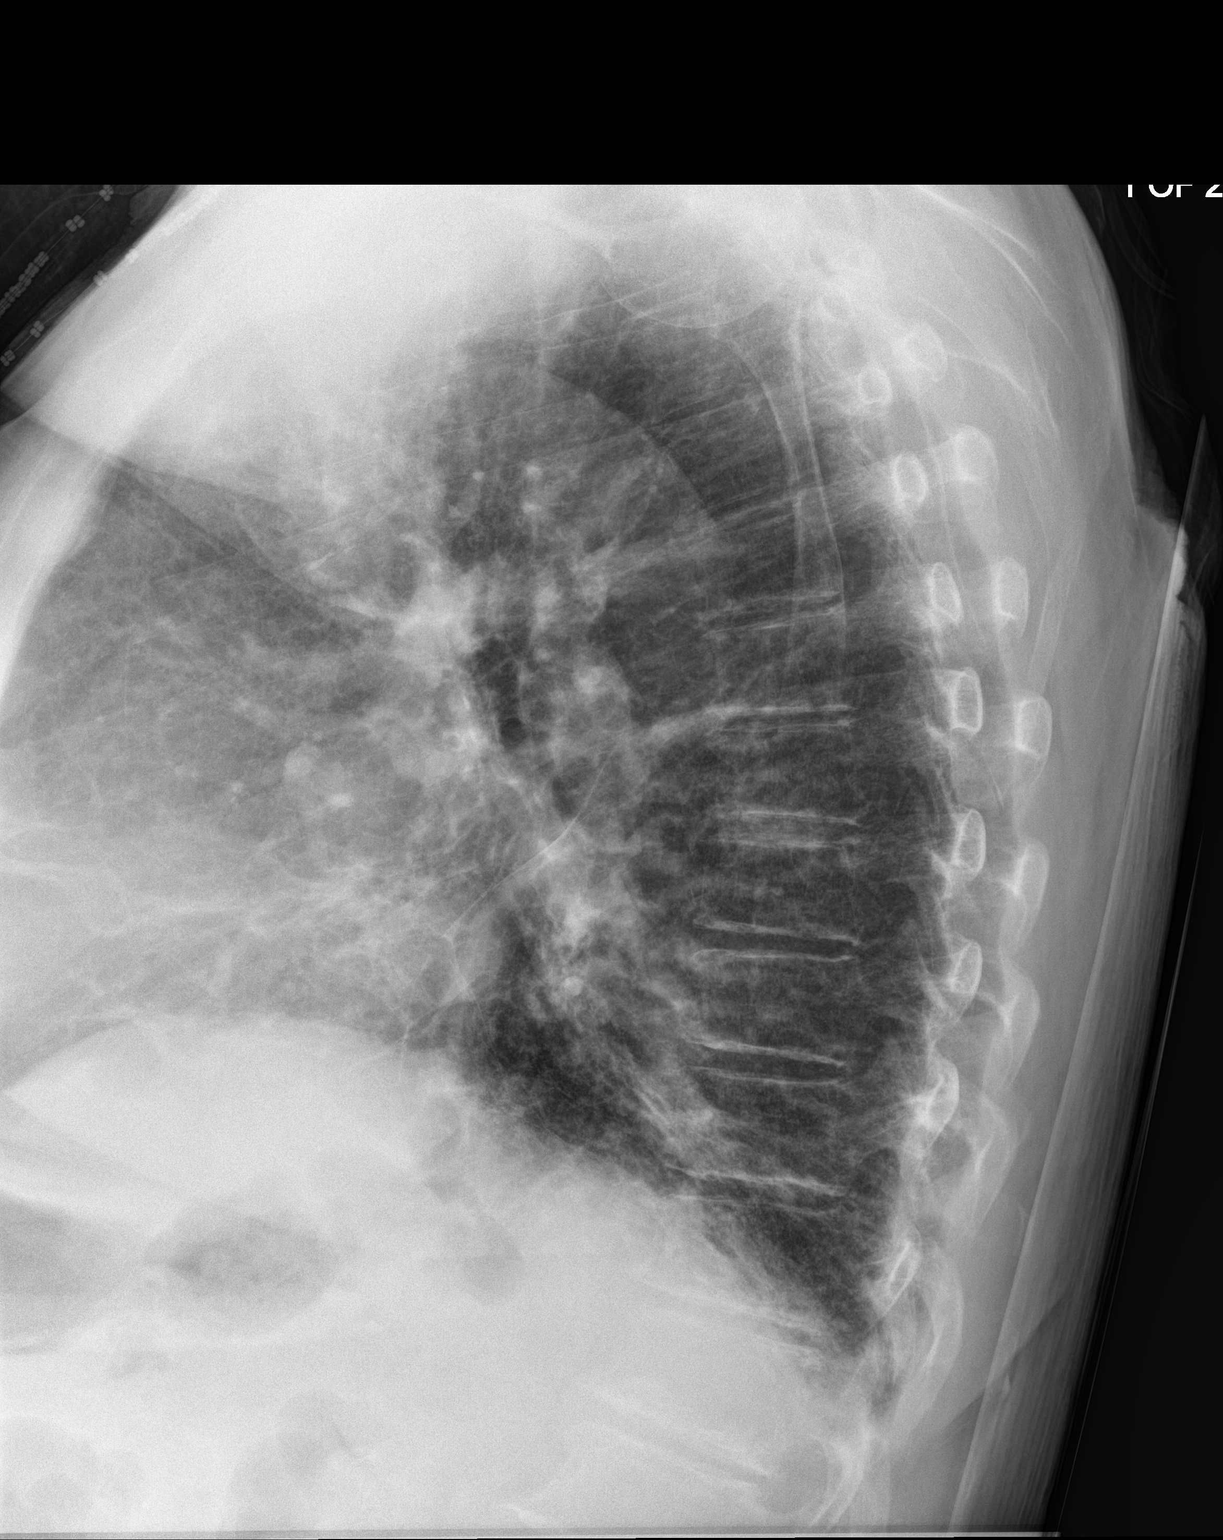

[chest ap]
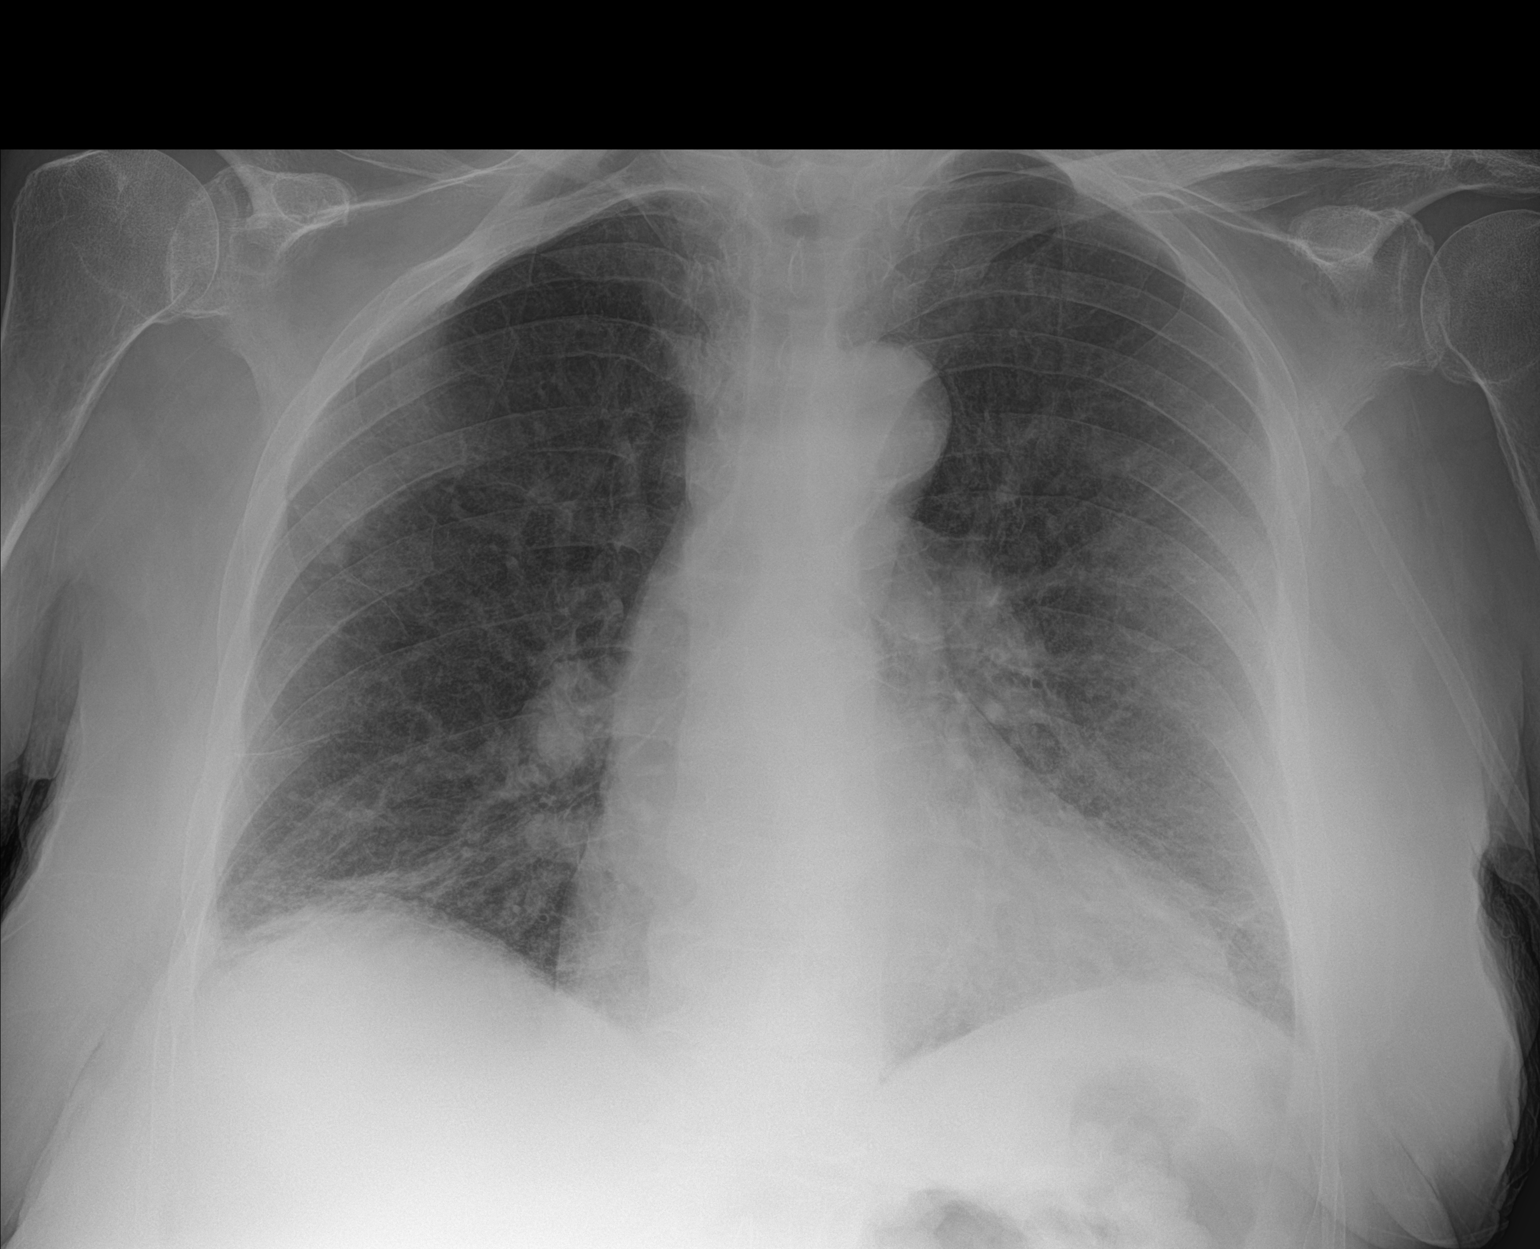

[chest lat (2 of 2)]
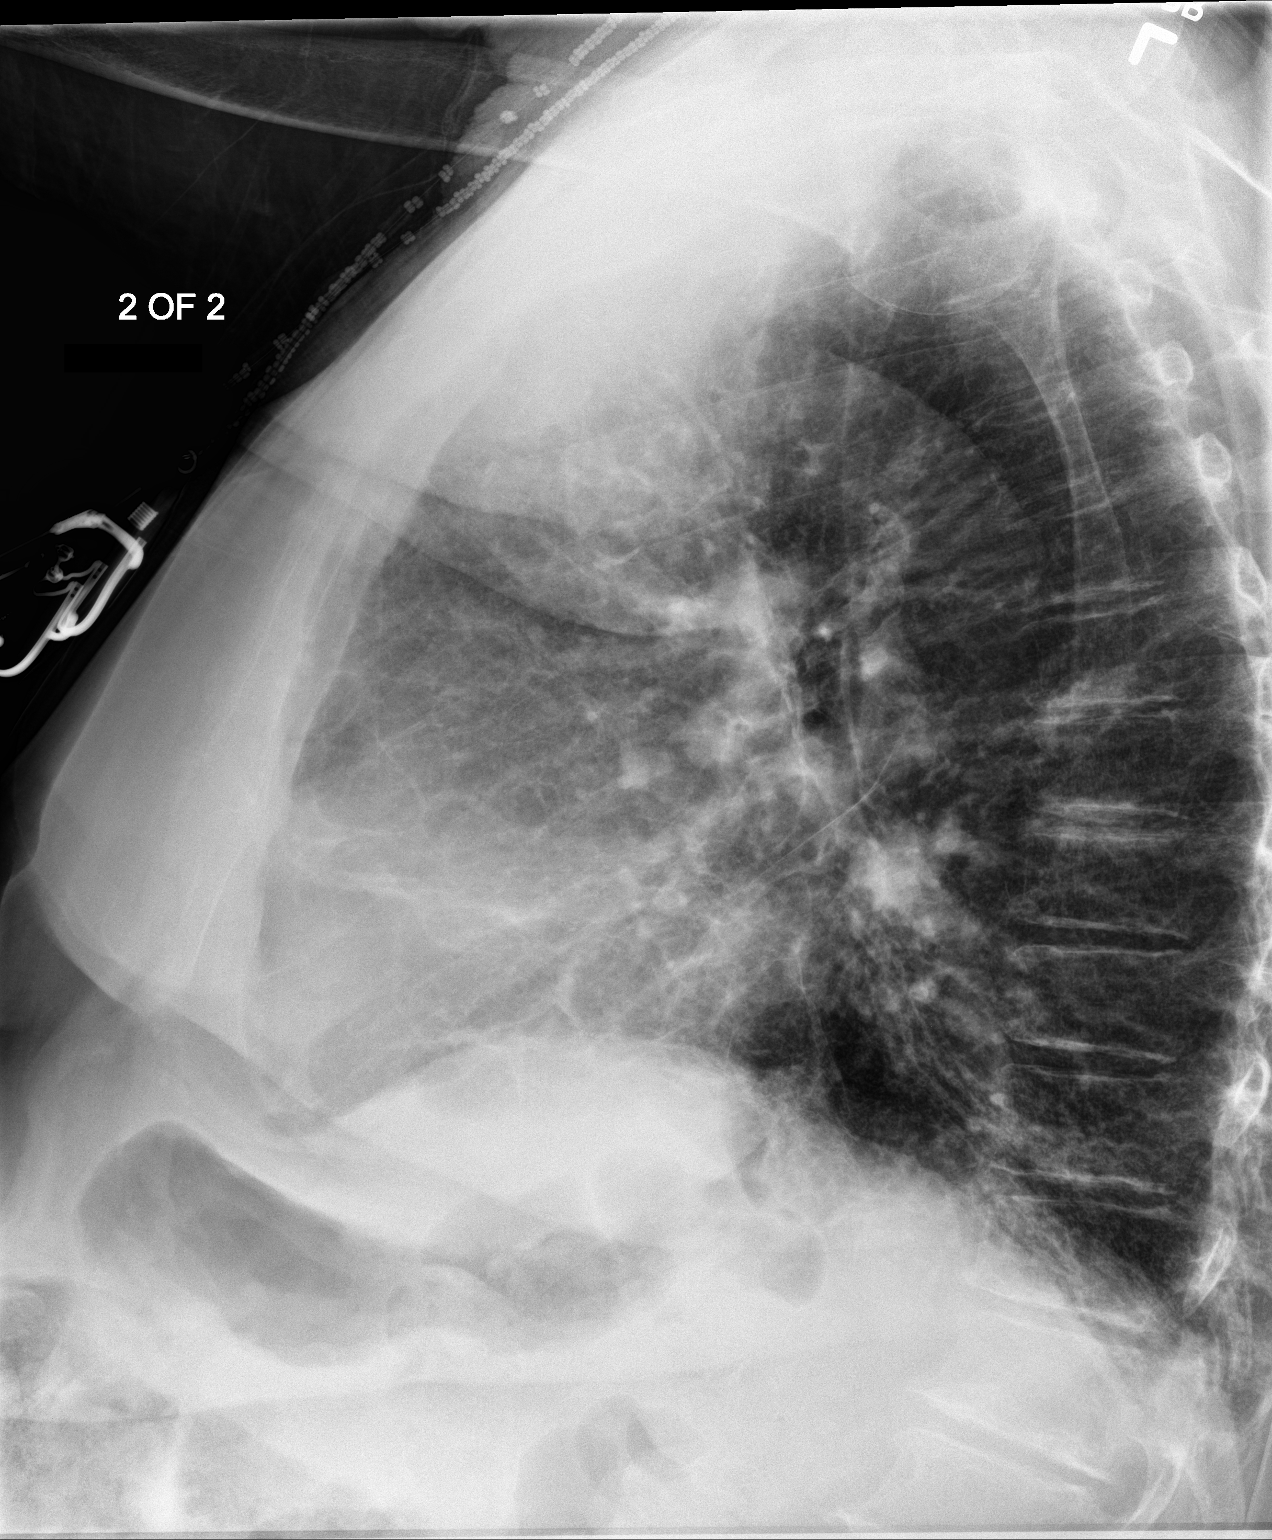

[3 of 3 positions shown; findings below may reference images not displayed]

FINDINGS: No acute opacity or pleural effusion. Coarse chronic interstitial
changes. Borderline to mild cardiomegaly. Generous central pulmonary
arteries. Aortic atherosclerosis. No pneumothorax. Mild reticular
changes at the bases on lateral view, consistent with fibrosis.
IMPRESSION: 1. Chronic interstitial lung changes with atelectasis or scarring at
both bases.
2. Mild cardiomegaly. Prominent central pulmonary arteries, may
correlate to history of pulmonary hypertension

## 2020-07-30 ENCOUNTER — Other Ambulatory Visit: Payer: Self-pay | Admitting: Cardiology

## 2020-07-30 NOTE — Telephone Encounter (Signed)
Prescription refill request for Eliquis received. Indication: Atrial fib Last office visit: 12/08/19 Scr: 1.66 on 05/31/19 Age: 80 Weight: 86.3  Based on above findings Eliquis 2.25m twice daily is the appropriate dose.  Refill approved.  Pt is past due for appt with Dr MDomenic Polite  Message sent to scheduler to make appt.  Pt will need BMP/CBC at that time for Eliquis refills

## 2020-08-01 DIAGNOSIS — I445 Left posterior fascicular block: Secondary | ICD-10-CM | POA: Diagnosis not present

## 2020-08-01 DIAGNOSIS — R0902 Hypoxemia: Secondary | ICD-10-CM | POA: Diagnosis not present

## 2020-08-10 DIAGNOSIS — R339 Retention of urine, unspecified: Secondary | ICD-10-CM | POA: Diagnosis not present

## 2020-08-14 ENCOUNTER — Other Ambulatory Visit (HOSPITAL_COMMUNITY): Payer: Self-pay | Admitting: Cardiology

## 2020-08-30 ENCOUNTER — Other Ambulatory Visit: Payer: Self-pay | Admitting: Cardiology

## 2020-08-31 DIAGNOSIS — I445 Left posterior fascicular block: Secondary | ICD-10-CM | POA: Diagnosis not present

## 2020-08-31 DIAGNOSIS — R0902 Hypoxemia: Secondary | ICD-10-CM | POA: Diagnosis not present

## 2020-09-12 ENCOUNTER — Encounter: Payer: Self-pay | Admitting: Cardiology

## 2020-09-12 DIAGNOSIS — Z299 Encounter for prophylactic measures, unspecified: Secondary | ICD-10-CM | POA: Diagnosis not present

## 2020-09-12 DIAGNOSIS — I10 Essential (primary) hypertension: Secondary | ICD-10-CM | POA: Diagnosis not present

## 2020-09-12 DIAGNOSIS — Z1339 Encounter for screening examination for other mental health and behavioral disorders: Secondary | ICD-10-CM | POA: Diagnosis not present

## 2020-09-12 DIAGNOSIS — Z79899 Other long term (current) drug therapy: Secondary | ICD-10-CM | POA: Diagnosis not present

## 2020-09-12 DIAGNOSIS — Z6827 Body mass index (BMI) 27.0-27.9, adult: Secondary | ICD-10-CM | POA: Diagnosis not present

## 2020-09-12 DIAGNOSIS — Z0000 Encounter for general adult medical examination without abnormal findings: Secondary | ICD-10-CM | POA: Diagnosis not present

## 2020-09-12 DIAGNOSIS — Z1331 Encounter for screening for depression: Secondary | ICD-10-CM | POA: Diagnosis not present

## 2020-09-12 DIAGNOSIS — E7800 Pure hypercholesterolemia, unspecified: Secondary | ICD-10-CM | POA: Diagnosis not present

## 2020-09-12 DIAGNOSIS — R5383 Other fatigue: Secondary | ICD-10-CM | POA: Diagnosis not present

## 2020-09-12 DIAGNOSIS — Z7189 Other specified counseling: Secondary | ICD-10-CM | POA: Diagnosis not present

## 2020-09-13 LAB — TSH: TSH: 0.13 — AB (ref <=5.90)

## 2020-09-14 ENCOUNTER — Encounter: Payer: Self-pay | Admitting: Neurology

## 2020-09-14 ENCOUNTER — Other Ambulatory Visit: Payer: Self-pay

## 2020-09-14 ENCOUNTER — Ambulatory Visit: Payer: PPO | Admitting: Neurology

## 2020-09-14 VITALS — BP 123/73 | HR 69 | Resp 18 | Wt 179.0 lb

## 2020-09-14 DIAGNOSIS — R292 Abnormal reflex: Secondary | ICD-10-CM

## 2020-09-14 DIAGNOSIS — M47816 Spondylosis without myelopathy or radiculopathy, lumbar region: Secondary | ICD-10-CM

## 2020-09-14 DIAGNOSIS — R339 Retention of urine, unspecified: Secondary | ICD-10-CM | POA: Diagnosis not present

## 2020-09-14 NOTE — Progress Notes (Signed)
Follow-up Visit   Date: 09/14/20   Wanda [name] MRN: [id number] DOB: 26-Jan-1941   Interim History: Wanda [name] is a 80 y.o. right-handed Caucasian female with atrial fibrillation, type 2 diabetes mellitus, COPD on oxygen (2-3L), pulmonary hypertension, hypertension, history of breast cancer s/p left mastectomy  returning to the clinic for follow-up of bilateral leg numbness.  The patient was accompanied to the clinic by son who also provides collateral information.    History of present illness: For the past 2 years, she has numbness and tight sensation of the feet, legs, and thighs.  Symptoms are constant.  Nothing which exacerbates or alleviates her symptoms.  She denies difficulty with walking and symptoms are not worse with prolonged standing or walking.  She endorses leg cramps.  No burning pain or tingling, leg weakness, or low back pain.  She walks unassisted.  Her diabetes has been well-controlled and now she is off medication. She takes gabapentin 130m three times daily and does not notice any benefit. 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  There is moderate anterolisthesis at L4-5.  UPDATE 09/14/2020: She has completed PT and did not appreciate any benefit in her leg numbness, so discharged early. She continues to have numbness from the hips down into her legs bilaterally.  No leg weakness. 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  She denies back pain.   Her most concerning issue that she tells me is pain in her vagina, especially with sitting, which has been present for months.  She has not seen a gynecologist.  Medications:  Current Outpatient Medications on File Prior to Visit  Medication Sig Dispense Refill   Alpha-Lipoic Acid 300 MG CAPS Take 1 capsule by mouth 2 (two) times daily.     ALPRAZolam (XANAX) 1 MG  tablet Take 1 mg by mouth at bedtime.     apixaban (ELIQUIS) 2.5 MG TABS tablet TAKE ONE TABLET BY MOUTH TWICE DAILY 60 tablet 3   atorvastatin (LIPITOR) 10 MG tablet Take 10 mg by mouth every evening.      carvedilol (COREG) 25 MG tablet Take 25 mg by mouth 2 (two) times daily with a meal.      citalopram (CELEXA) 20 MG tablet Take 20 mg by mouth daily.     diltiazem (CARDIZEM CD) 240 MG 24 hr capsule TAKE ONE CAPSULE BY MOUTH DAILY 60 capsule 0   diltiazem (DILACOR XR) 240 MG 24 hr capsule Take 240 mg by mouth daily.     furosemide (LASIX) 20 MG tablet TAKE ONE TABLET BY MOUTH EVERY DAY 60 tablet 0   gabapentin (NEURONTIN) 100 MG capsule Take 100 mg by mouth 3 (three) times daily.      glimepiride (AMARYL) 1 MG tablet Take 1 mg by mouth daily.     hydrALAZINE (APRESOLINE) 25 MG tablet TAKE TWO (2) TABLETS BY MOUTH THREE TIMES DAILY 180 tablet 0   lisinopril (ZESTRIL) 10 MG tablet Take 1 tablet (10 mg total) by mouth daily.     Multiple Vitamins-Minerals (MULTIVITAMIN GUMMIES ADULT) CHEW Chew 1 each by mouth daily.     tiZANidine (ZANAFLEX) 2 MG tablet Take 1 tablet (2 mg total) by mouth at bedtime. 30 tablet 3   trimethoprim (TRIMPEX) 100 MG tablet Take 100 mg by mouth at bedtime.     vitamin B-12 (CYANOCOBALAMIN) 1000 MCG tablet Take  1,000 mcg by mouth daily.     No current facility-administered medications on file prior to visit.    Allergies:  Allergies  Allergen Reactions   Gluten Diarrhea   Hydromorphone Hcl Other (See Comments)    REACTION: lowers BP levels   Prednisone Other (See Comments)    DOES NOT LIKE SIDE EFFECTS, INTERACTION WITH WARFARIN    Vital Signs:  BP 123/73   Pulse 69   Resp 18   Wt 179 lb (81.2 kg)   SpO2 98%   BMI 27.22 kg/m   Neurological Exam: MENTAL STATUS including orientation to time, place, person, recent and remote memory, attention span and concentration, language, and fund of knowledge is normal.  Speech is not dysarthric.  CRANIAL NERVES:   No visual field defects.  Pupils equal round and reactive to light.  Normal conjugate, extra-ocular eye movements in all directions of gaze.  No ptosis   MOTOR:  Motor strength is 5/5 in all extremities.  No atrophy, fasciculations or abnormal movements.  No pronator drift.  Tone is normal.    MSRs:  Reflexes are 2+/4 throughout, except 3+ bilateral patella.  SENSORY:  Vibration is diminished at the ankles, intact at the knees and hands. Temperature intact throughout  COORDINATION/GAIT:  Gait narrow based and stable.   Data: MRI lumbar spine wo contrast 04/24/2020: 1. L4-5: Chronic bilateral pars defects with 11-12 mm of anterolisthesis. Chronic disc degeneration. Bilateral foraminal stenosis that could affect either or both L4 nerves. 2. L5-S1: Right foraminal narrowing because of osteophyte and bulging disc material could possibly affect the exiting L5 nerve. 3. Lesser, grossly non-compressive degenerative changes at L3-4. 4. L2-3: Disc bulge. Facet and ligamentous hypertrophy. Bilateral lateral recess narrowing that could possibly be symptomatic.   MRI thoracic spine wo contrast 07/13/2020: 1. 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 No visible cause such as mass or degenerative compression. 2. Generalized disc and facet degeneration with mild upper thoracic listhesis. 3. Goiter.  IMPRESSION/PLAN: Bilateral leg numbness, most likely due to lumbar radiculopathy affeciting the L4-5 nerve roots. MRI lumbar spine shows spondylosis with biforaminal stenosis affecting the L4 nerve roots and right L5 nerve root. MRI thoracic spine shows mild diffuse hydromyelia, which may explain her hyperreflexia.  - No improvement with PT or muscle relaxants  - At this juncture, I discussed that we can refer her to PM&R at [facility] for consideration of ESI or monitor.  She prefers to monitor, as she would like to be evaluated by gynecology for her vaginal pain, which is more bothersome.   2.  Distal leg paresthesias secondary to peripheral neuropathy.  - Consider NCS/EMG, if symptoms get worse   Return to clinic as needed  Total time spent reviewing records, interview, history/exam, documentation, counseling, and coordination of care on day of encounter:  30 min    Thank you for allowing me to participate in patient's care.  If I can answer any additional questions, I would be pleased to do so.    Sincerely,    Thurmond Hildebran K. Posey Pronto, DO

## 2020-09-14 NOTE — Patient Instructions (Signed)
Please see a gynecologist for your vaginal and groin pain  If you would like to have spine injections for your back, please call my office and we can refer you

## 2020-09-27 ENCOUNTER — Encounter: Payer: Self-pay | Admitting: Orthopaedic Surgery

## 2020-09-27 ENCOUNTER — Other Ambulatory Visit: Payer: Self-pay

## 2020-09-27 ENCOUNTER — Ambulatory Visit: Payer: PPO | Admitting: Orthopaedic Surgery

## 2020-09-27 DIAGNOSIS — M545 Low back pain, unspecified: Secondary | ICD-10-CM | POA: Diagnosis not present

## 2020-10-01 DIAGNOSIS — I445 Left posterior fascicular block: Secondary | ICD-10-CM | POA: Diagnosis not present

## 2020-10-01 DIAGNOSIS — R0902 Hypoxemia: Secondary | ICD-10-CM | POA: Diagnosis not present

## 2020-10-01 NOTE — Progress Notes (Signed)
Office Visit Note   Patient: Wanda [name]           Date of Birth: Jan 06, 1941           MRN: [id number] Visit Date: 09/27/2020              Requested by: Glenda Chroman, MD [place],  [place] [address] PCP: Glenda Chroman, MD   Assessment & Plan: Visit Diagnoses:  1. Low back pain without sciatica, unspecified back pain laterality, unspecified chronicity     Plan: Patient has bilateral pars defects anterolisthesis without severe stenosis no neurogenic claudication symptoms currently.  We reviewed MRI scan and report.  Symptoms are not severe enough for her to consider operative intervention at this point.  She can follow-up if she has progressive symptoms.  Follow-Up Instructions: No follow-ups on file.   Orders:  No orders of the defined types were placed in this encounter.  No orders of the defined types were placed in this encounter.     Procedures: No procedures performed   Clinical Data: No additional findings.   Subjective: Chief Complaint  Patient presents with   Lower Back - Pain    HPI 80 year old female seen with low back pain and she states she has bilateral neuropathy times greater than a year.  She has pain when she sits and feels like a giant Band-Aid is wrapped around her waist.  She states she has had reaction with steroids in the past as well as gluten.  She is a previous smoker.  She has had the MRI scan which shows anterolisthesis at L4-5 with chronic pars defects and 11 to 12 mm anterolisthesis.  Mild foraminal narrowing at L5-S1.  Patient has a history of type 2 diabetes with nephropathy kidney disease stage III chronic respiratory failure with hypoxia COPD, GERD, hypertension, PVCs, atrial fibrillation, chronic Eliquis.  Review of Systems negative for fever chills no associated bowel or bladder symptoms all other systems noncontributory to HPI.   Objective: Vital Signs: Ht 5' 8" (1.727 m)   Wt 171 lb (77.6 kg)   BMI 26.00 kg/m    Physical Exam Constitutional:      Appearance: She is well-developed.  HENT:     Head: Normocephalic.     Right Ear: External ear normal.     Left Ear: External ear normal. There is no impacted cerumen.  Eyes:     Pupils: Pupils are equal, round, and reactive to light.  Neck:     Thyroid: No thyromegaly.     Trachea: No tracheal deviation.  Cardiovascular:     Rate and Rhythm: Normal rate.  Pulmonary:     Effort: Pulmonary effort is normal.  Abdominal:     Palpations: Abdomen is soft.  Musculoskeletal:     Cervical back: No rigidity.  Skin:    General: Skin is warm and dry.  Neurological:     Mental Status: She is alert and oriented to person, place, and time.  Psychiatric:        Behavior: Behavior normal.    Ortho Exam patient has intact reflexes anterior tib EHL is intact.  Some pain with straight leg raising 90 degrees negative logroll to the hips.  Specialty Comments:  No specialty comments available.  Imaging: CLINICAL DATA:  Low back pain radiating to both legs over the last 4 months.   EXAM: MRI LUMBAR SPINE WITHOUT CONTRAST   TECHNIQUE: Multiplanar, multisequence MR imaging of the lumbar spine was performed.  No intravenous contrast was administered.   COMPARISON:  None.   FINDINGS: Segmentation:  5 lumbar type vertebral bodies assumed.   Alignment:  Chronic spondylolisthesis at L4-5 of 11-12 mm.   Vertebrae:  No fracture or primary bone lesion.   Conus medullaris and cauda equina: Conus extends to the L1 level. Conus and cauda equina appear normal.   Paraspinal and other soft tissues: Small retroperitoneal cysts at the L2 level. These probably represent dilated benign and insignificant lymphatic spaces.   Disc levels:   T11-12 and T12-L1: Moderate bulging of the discs. Narrowing of the ventral subarachnoid space but no compressive effect upon the distal cord or neural foramina.   L1-2: Minimal disc bulge. Mild facet hypertrophy. Mild  narrowing of the lateral recesses without neural compression.   L2-3: Moderate disc bulge. Facet and ligamentous hypertrophy. Stenosis of both lateral recesses which could possibly cause neural compression or irritation.   L3-4: Mild bulging of the disc. Facet and ligamentous hypertrophy. Minimal lateral recess narrowing without neural compression.   L4-5: Chronic bilateral pars defects with 11-12 mm of anterolisthesis. Chronic disc degeneration. Sufficient patency of the central canal. Bilateral foraminal narrowing that could affect either or both L4 nerves.   L5-S1: Endplate osteophytes and mild bulging of the disc more prominent towards the right. Facet degeneration. The central canal is sufficiently patent. There is foraminal narrowing on the right that could possibly affect the exiting L5 nerve. Incidentally, left L5 and S1 root sleeves are conjoined.   IMPRESSION: 1. L4-5: Chronic bilateral pars defects with 11-12 mm of anterolisthesis. Chronic disc degeneration. Bilateral foraminal stenosis that could affect either or both L4 nerves. 2. L5-S1: Right foraminal narrowing because of osteophyte and bulging disc material could possibly affect the exiting L5 nerve. 3. Lesser, grossly non-compressive degenerative changes at L3-4. 4. L2-3: Disc bulge. Facet and ligamentous hypertrophy. Bilateral lateral recess narrowing that could possibly be symptomatic.     Electronically Signed   By: Nelson Chimes M.D.   On: 04/24/2020 15:38   PMFS History: Patient Active Problem List   Diagnosis Date Noted   Low back pain 10/02/2020   Acute on chronic diastolic HF (heart failure) ([place]) 10/02/2018   Pulmonary edema 10/02/2018   Type 2 diabetes with nephropathy ([place]) 10/02/2018   Chronic kidney disease, stage III (moderate) ([facility]) 10/02/2018   Chronic respiratory failure with hypoxia ([facility]) 10/02/2018   GERD (gastroesophageal reflux disease) 10/02/2018   COPD (chronic obstructive  pulmonary disease) ([place]) 10/02/2018   Atrial fibrillation ([place])    Pulmonary hypertension ([place])    Long term (current) use of anticoagulants 05/03/2010   HLD (hyperlipidemia) 01/26/2009   Essential hypertension, benign 01/26/2009   PREMATURE VENTRICULAR CONTRACTIONS 01/26/2009   Past Medical History:  Diagnosis Date   B12 deficiency    Celiac disease    COPD (chronic obstructive pulmonary disease) ([place])    Essential hypertension    Goiter    History of breast cancer    Status post mastectomy   History of cardiac catheterization    No significant CAD 2006   Mixed hyperlipidemia    Neuropathy    Paroxysmal atrial fibrillation ([facility])    Coumadin therapy   PUD (peptic ulcer disease)    Pulmonary hypertension ([place])    Type 2 diabetes mellitus ([facility])     Family History  Problem Relation Age of Onset   Heart attack Mother    Celiac disease Brother    CVA Maternal Grandmother    Aortic aneurysm Maternal  Grandfather     Past Surgical History:  Procedure Laterality Date   DILATION AND CURETTAGE OF UTERUS     HYSTEROSCOPY W/ ENDOMETRIAL ABLATION     [employer]   MASTECTOMY     Social History   Occupational History    Employer: RETIRED    Comment: Retired former Therapist, art at [employer]  Tobacco Use   Smoking status: Former    Types: Cigarettes    Quit date: 02/11/1995    Years since quitting: 25.6   Smokeless tobacco: Never  Vaping Use   Vaping Use: Never used  Substance and Sexual Activity   Alcohol use: Yes    Alcohol/week: 0.0 standard drinks    Comment: occ   Drug use: No   Sexual activity: Not on file

## 2020-10-02 DIAGNOSIS — M545 Low back pain, unspecified: Secondary | ICD-10-CM | POA: Insufficient documentation

## 2020-10-10 ENCOUNTER — Other Ambulatory Visit: Payer: Self-pay | Admitting: Cardiology

## 2020-10-10 DIAGNOSIS — M4316 Spondylolisthesis, lumbar region: Secondary | ICD-10-CM | POA: Diagnosis not present

## 2020-10-10 DIAGNOSIS — E1165 Type 2 diabetes mellitus with hyperglycemia: Secondary | ICD-10-CM | POA: Diagnosis not present

## 2020-10-10 DIAGNOSIS — E1122 Type 2 diabetes mellitus with diabetic chronic kidney disease: Secondary | ICD-10-CM | POA: Diagnosis not present

## 2020-10-10 DIAGNOSIS — I10 Essential (primary) hypertension: Secondary | ICD-10-CM | POA: Diagnosis not present

## 2020-10-10 DIAGNOSIS — Z299 Encounter for prophylactic measures, unspecified: Secondary | ICD-10-CM | POA: Diagnosis not present

## 2020-10-10 DIAGNOSIS — N184 Chronic kidney disease, stage 4 (severe): Secondary | ICD-10-CM | POA: Diagnosis not present

## 2020-10-12 DIAGNOSIS — R339 Retention of urine, unspecified: Secondary | ICD-10-CM | POA: Diagnosis not present

## 2020-10-25 DIAGNOSIS — E0590 Thyrotoxicosis, unspecified without thyrotoxic crisis or storm: Secondary | ICD-10-CM | POA: Diagnosis not present

## 2020-10-26 ENCOUNTER — Other Ambulatory Visit: Payer: Self-pay | Admitting: Cardiology

## 2020-10-26 LAB — TSH: TSH: 0.2 — AB (ref 0.41–5.90)

## 2020-11-01 DIAGNOSIS — I445 Left posterior fascicular block: Secondary | ICD-10-CM | POA: Diagnosis not present

## 2020-11-01 DIAGNOSIS — R0902 Hypoxemia: Secondary | ICD-10-CM | POA: Diagnosis not present

## 2020-11-09 DIAGNOSIS — I10 Essential (primary) hypertension: Secondary | ICD-10-CM | POA: Diagnosis not present

## 2020-11-09 DIAGNOSIS — E7800 Pure hypercholesterolemia, unspecified: Secondary | ICD-10-CM | POA: Diagnosis not present

## 2020-11-13 NOTE — Patient Instructions (Signed)
Thyroid-Stimulating Hormone Test Why am I having this test? The thyroid is a gland in the lower front of the neck. It makes hormones that affect many body parts and systems, including the system that affects how quickly the body burns fuel for energy (metabolism). The pituitary gland is located just below the brain, behind the eyes and nasal passages. It helps maintain thyroid hormone levels and thyroid gland function. You may have a thyroid-stimulating hormone (TSH) test if you have possible symptoms of abnormal thyroid hormone levels. This test can help your health care provider: Diagnose a disorder of the thyroid gland or pituitary gland. Manage your condition and treatment if you have an underactive thyroid (hypothyroidism) or an overactive thyroid (hyperthyroidism). Newborn babies may have this test done to screen for hypothyroidism that is present at birth (congenital). What is being tested? This test measures the amount of TSH in your blood. TSH may also be called thyrotropin. When the thyroid does not make enough hormones, the pituitary gland releases TSH into the bloodstream to stimulate the thyroid gland to make more hormones. What kind of sample is taken?   A blood sample is required for this test. It is usually collected by inserting a needle into a blood vessel. For newborns, a small amount of blood may be collected from the umbilical cord, or by using a small needle to prick the baby's heel (heel stick). Tell a health care provider about: All medicines you are taking, including vitamins, herbs, eye drops, creams, and over-the-counter medicines. Any blood disorders you have. Any surgeries you have had. Any medical conditions you have. Whether you are pregnant or may be pregnant. How are the results reported? Your test results will be reported as a value that indicates how much TSH is in your blood. Your health care provider will compare your results to normal ranges that were  established after testing a large group of people (reference ranges). Reference ranges may vary among labs and hospitals. For this test, common reference ranges are: Adult: 2-10 microunits/mL or 2-10 milliunits/L. Newborn: Heel stick: 3-18 microunits/mL or 3-18 milliunits/L. Umbilical cord: 2-35 microunits/mL or 3-12 milliunits/L. What do the results mean? Results that are within the reference range are considered normal. This means that you have a normal amount of TSH in your blood. Results that are higher than the reference range mean that your TSH levels are too high. This may mean: Your thyroid gland is not making enough thyroid hormones. Your thyroid medicine dosage is too low. You have a tumor on your pituitary gland. This is rare. Results that are lower than the reference range mean that your TSH levels are too low. This may be caused by hyperthyroidism or by a problem with the pituitary gland function. Talk with your health care provider about what your results mean. Questions to ask your health care provider Ask your health care provider, or the department that is doing the test: When will my results be ready? How will I get my results? What are my treatment options? What other tests do I need? What are my next steps? Summary You may have a thyroid-stimulating hormone (TSH) test if you have possible symptoms of abnormal thyroid hormone levels. The thyroid is a gland in the lower front of the neck. It makes hormones that affect many body parts and systems. The pituitary gland is located just below the brain, behind the eyes and nasal passages. It helps maintain thyroid hormone levels and thyroid gland function. This test measures the  amount of TSH in your blood. TSH is made by the pituitary gland. It may also be called thyrotropin. This information is not intended to replace advice given to you by your health care provider. Make sure you discuss any questions you have with your  health care provider. Document Revised: 10/13/2019 Document Reviewed: 10/13/2019 Elsevier Patient Education  2022 [employer].

## 2020-11-14 ENCOUNTER — Ambulatory Visit: Payer: PPO | Admitting: Nurse Practitioner

## 2020-11-14 ENCOUNTER — Encounter: Payer: Self-pay | Admitting: Nurse Practitioner

## 2020-11-14 ENCOUNTER — Other Ambulatory Visit: Payer: Self-pay

## 2020-11-14 VITALS — BP 112/71 | HR 74 | Ht 68.0 in | Wt 187.4 lb

## 2020-11-14 DIAGNOSIS — R7989 Other specified abnormal findings of blood chemistry: Secondary | ICD-10-CM | POA: Diagnosis not present

## 2020-11-14 DIAGNOSIS — R339 Retention of urine, unspecified: Secondary | ICD-10-CM | POA: Diagnosis not present

## 2020-11-14 NOTE — Progress Notes (Signed)
11/14/2020     Endocrinology Consult Note    Subjective:    Patient ID: Wanda [name], female    DOB: 04/27/1940, PCP Glenda Chroman, MD.   Past Medical History:  Diagnosis Date   B12 deficiency    Cancer ([place])    Celiac disease    COPD (chronic obstructive pulmonary disease) ([place])    Essential hypertension    Goiter    History of breast cancer    Status post mastectomy   History of cardiac catheterization    No significant CAD 2006   Hypertension    Mixed hyperlipidemia    Neuropathy    Paroxysmal atrial fibrillation (HCC)    Coumadin therapy   PUD (peptic ulcer disease)    Pulmonary hypertension ([place])    Type 2 diabetes mellitus ([place])     Past Surgical History:  Procedure Laterality Date   DILATION AND CURETTAGE OF UTERUS     HYSTEROSCOPY W/ ENDOMETRIAL ABLATION     ThermaChoice   MASTECTOMY      Social History   Socioeconomic History   Marital status: Widowed    Spouse name: Not on file   Number of children: 1   Years of education: Not on file   Highest education level: Not on file  Occupational History    Employer: RETIRED    Comment: Retired former Therapist, art at [employer]  Tobacco Use   Smoking status: Former    Types: Cigarettes    Quit date: 02/11/1995    Years since quitting: 25.7   Smokeless tobacco: Never  Vaping Use   Vaping Use: Never used  Substance and Sexual Activity   Alcohol use: Yes    Alcohol/week: 0.0 standard drinks    Comment: occ   Drug use: No   Sexual activity: Not on file  Other Topics Concern   Not on file  Social History Narrative   Has a EMS son named Justin    Right Handed   Lives in one story home    Retired Marine scientist    Drinks 1 cup of coffee a day       Patient is on 2.5 L of Oxygen    Social Determinants of Radio broadcast assistant Strain: Not on file  Food Insecurity: Not on file  Transportation Needs: Not on file  Physical Activity: Not on file  Stress: Not on file  Social Connections:  Not on file    Family History  Problem Relation Age of Onset   Heart attack Mother    Celiac disease Brother    CVA Maternal Grandmother    Aortic aneurysm Maternal Grandfather     Outpatient Encounter Medications as of 11/14/2020  Medication Sig   ALPRAZolam (XANAX) 1 MG tablet Take 1 mg by mouth at bedtime.   apixaban (ELIQUIS) 2.5 MG TABS tablet TAKE ONE TABLET BY MOUTH TWICE DAILY   atorvastatin (LIPITOR) 10 MG tablet Take 10 mg by mouth every evening.    carvedilol (COREG) 12.5 MG tablet Take 12.5 mg by mouth 2 (two) times daily.   citalopram (CELEXA) 20 MG tablet Take 20 mg by mouth daily.   diltiazem (CARDIZEM CD) 240 MG 24 hr capsule TAKE ONE CAPSULE BY MOUTH DAILY   diltiazem (DILACOR XR) 240 MG 24 hr capsule Take 240 mg by mouth daily.   furosemide (LASIX) 20 MG tablet TAKE ONE TABLET BY MOUTH EVERY DAY   glimepiride (AMARYL) 1 MG tablet Take 1 mg by  mouth daily.   hydrALAZINE (APRESOLINE) 25 MG tablet TAKE TWO (2) TABLETS BY MOUTH THREE TIMES DAILY   lisinopril (ZESTRIL) 10 MG tablet Take 1 tablet (10 mg total) by mouth daily.   Multiple Vitamins-Minerals (MULTIVITAMIN GUMMIES ADULT) CHEW Chew 1 each by mouth daily.   trimethoprim (TRIMPEX) 100 MG tablet Take 100 mg by mouth at bedtime.   vitamin B-12 (CYANOCOBALAMIN) 1000 MCG tablet Take 1,000 mcg by mouth daily.   Alpha-Lipoic Acid 300 MG CAPS Take 1 capsule by mouth 2 (two) times daily.   carvedilol (COREG) 25 MG tablet Take 25 mg by mouth 2 (two) times daily with a meal.  (Patient not taking: Reported on 11/14/2020)   gabapentin (NEURONTIN) 100 MG capsule Take 100 mg by mouth 3 (three) times daily.  (Patient not taking: Reported on 11/14/2020)   tiZANidine (ZANAFLEX) 2 MG tablet Take 1 tablet (2 mg total) by mouth at bedtime. (Patient not taking: Reported on 11/14/2020)   No facility-administered encounter medications on file as of 11/14/2020.    ALLERGIES: Allergies  Allergen Reactions   Gluten Diarrhea    Hydromorphone Hcl Other (See Comments)    REACTION: lowers BP levels   Prednisone Other (See Comments)    DOES NOT LIKE SIDE EFFECTS, INTERACTION WITH WARFARIN    VACCINATION STATUS: Immunization History  Administered Date(s) Administered   Influenza,inj,Quad PF,6+ Mos 11/18/2019     HPI  Wanda [name] is 80 y.o. female who presents today with a medical history as above. she is being seen in consultation for hyperthyroidism requested by Glenda Chroman, MD.  she denies any specific symptoms of thyroid dysfunction.  her most recent thyroid labs revealed marginally suppressed TSH of 0.204 and normal Free T4 level of 1.33 on 10/26/20.  She is noted to have suppressed TSH level dating back to August 2021.  She did have thyroid ultrasound in 2011 which showed suspicious nodules in which a biopsy was performed favoring benignity.   she denies dysphagia, choking, shortness of breath, no recent voice change.    she denies family history of thyroid dysfunction (however she does not know her fathers health history) and denies family hx of thyroid cancer. she denies personal history of goiter. she is not on any anti-thyroid medications nor on any thyroid hormone supplements. She does take a women's formulated multivitamin gummy which has Biotin.  she is willing to proceed with appropriate work up and therapy for thyrotoxicosis.   Review of systems  Constitutional: + Minimally fluctuating body weight, current Body mass index is 28.49 kg/m., no fatigue, no subjective hyperthermia, no subjective hypothermia Eyes: no blurry vision, no xerophthalmia ENT: no sore throat, no nodules palpated in throat, no dysphagia/odynophagia, no hoarseness Cardiovascular: no chest pain, no shortness of breath, no palpitations- hx afib, no leg swelling Respiratory: no cough, no shortness of breath Gastrointestinal: no nausea/vomiting/diarrhea Musculoskeletal: no muscle/joint aches Skin: no rashes, no  hyperemia Neurological: no tremors, no numbness, no tingling, no dizziness Psychiatric: no depression, no anxiety   Objective:    BP 112/71   Pulse 74   Ht _0  (1.727 m)   Wt 187 lb 6.4 oz (85 kg)   BMI 28.49 kg/m   Wt Readings from Last 3 Encounters:  11/14/20 187 lb 6.4 oz (85 kg)  09/27/20 171 lb (77.6 kg)  09/14/20 179 lb (81.2 kg)     BP Readings from Last 3 Encounters:  11/14/20 112/71  09/14/20 123/73  05/11/20 (!) 99/47  Physical Exam- Limited  Constitutional:  Body mass index is 28.49 kg/m. , not in acute distress, normal state of mind Eyes:  EOMI, no exophthalmos Neck: Supple, mild fullness to left neck Thyroid: No gross goiter Cardiovascular: irregular HR- hx afib, + murmurs, rubs, or gallops, no edema Respiratory: Adequate breathing efforts, no crackles, rales, rhonchi, or wheezing Musculoskeletal: no gross deformities, strength intact in all four extremities, no gross restriction of joint movements Skin:  no rashes, no hyperemia Neurological: slight tremor with outstretched hands   CMP     Component Value Date/Time   NA 141 05/31/2019 1239   K 4.1 05/31/2019 1239   CL 97 (L) 05/31/2019 1239   CO2 33 (H) 05/31/2019 1239   GLUCOSE 111 (H) 05/31/2019 1239   BUN 20 05/31/2019 1239   CREATININE 1.66 (H) 05/31/2019 1239   CALCIUM 9.2 05/31/2019 1239   PROT 6.3 (L) 10/02/2018 0742   ALBUMIN 3.4 (L) 10/02/2018 0742   AST 13 (L) 10/02/2018 0742   ALT 14 10/02/2018 0742   ALKPHOS 78 10/02/2018 0742   BILITOT 1.8 (H) 10/02/2018 0742   GFRNONAA 29 (L) 05/31/2019 1239   GFRAA 33 (L) 05/31/2019 1239     CBC    Component Value Date/Time   WBC 9.4 10/02/2018 0742   RBC 3.38 (L) 10/02/2018 0742   HGB 9.0 (L) 10/02/2018 0742   HCT 30.8 (L) 10/02/2018 0742   PLT 318 10/02/2018 0742   MCV 91.1 10/02/2018 0742   MCH 26.6 10/02/2018 0742   MCHC 29.2 (L) 10/02/2018 0742   RDW 14.1 10/02/2018 0742   LYMPHSABS 0.7 10/02/2018 0742    MONOABS 0.9 10/02/2018 0742   EOSABS 0.1 10/02/2018 0742   BASOSABS 0.0 10/02/2018 0742     Diabetic Labs (most recent): Lab Results  Component Value Date   HGBA1C 5.9 (H) 10/02/2018    Lipid Panel  No results found for: CHOL, TRIG, HDL, CHOLHDL, VLDL, LDLCALC, LDLDIRECT, LABVLDL   Lab Results  Component Value Date   TSH 0.20 (A) 10/26/2020   TSH 0.13 (A) 09/13/2020   TSH 0.44 09/13/2019   TSH 0.380 10/02/2018        Assessment & Plan:   1. Abnormal TSH  she is being seen at a kind request of Vyas, Dhruv B, MD.  her history and most recent labs are reviewed, and she was examined clinically. Subjective and objective findings are inconsistent with thyrotoxicosis, however more information is needed to rule this possibility out.  Will repeat thyroid function studies including antibody testing for autoimmune thyroid dysfunction after she has been off her Biotin supplement for at least 5 days.   The potential risks of untreated thyrotoxicosis and the need for definitive therapy have been discussed in detail with her, and she agrees to proceed with diagnostic workup and treatment plan.   she will return in 2 weeks to discuss test results.  No need for imaging at this time.  May consider repeat thyroid ultrasound given her history of suspicious nodule with negative biopsy back in 2011.  I did not initiate any new prescriptions at today's visit- she is already on beta-blocker and HR was stable.    -Patient is advised to maintain close follow up with Glenda Chroman, MD for primary care needs.   - Time spent with the patient: 60 minutes, of which >50% was spent in obtaining information about her symptoms, reviewing her previous labs, evaluations, and treatments, counseling her about her hyperthyroidism , and developing a plan  to confirm the diagnosis and long term treatment as necessary. Please refer to "Patient Self Inventory" in the Media tab for reviewed elements of pertinent  patient history.  Wanda [name] participated in the discussions, expressed understanding, and voiced agreement with the above plans.  All questions were answered to her satisfaction. she is encouraged to contact clinic should she have any questions or concerns prior to her return visit.   Follow up plan: Return in about 2 weeks (around 11/28/2020) for Thyroid follow up, Previsit labs.   Thank you for involving me in the care of this pleasant patient, and I will continue to update you with her progress.    Rayetta Pigg, [facility] [address] [place], [place] [address] Phone: [phone] Fax: [phone]  11/14/2020, 12:29 PM

## 2020-11-17 ENCOUNTER — Other Ambulatory Visit: Payer: Self-pay | Admitting: Cardiology

## 2020-11-19 ENCOUNTER — Telehealth: Payer: Self-pay | Admitting: Cardiology

## 2020-11-19 MED ORDER — FUROSEMIDE 20 MG PO TABS: 20.0000 mg | ORAL_TABLET | Freq: Every day | ORAL | 1 refills | Status: DC

## 2020-11-19 MED ORDER — HYDRALAZINE HCL 25 MG PO TABS: ORAL_TABLET | ORAL | 2 refills | Status: DC

## 2020-11-19 NOTE — Telephone Encounter (Signed)
Refilled to [facility] in [place]. Has apt 11/21/20 with Dr.McDowell

## 2020-11-19 NOTE — Telephone Encounter (Signed)
New message    Needs new prescriptions for    *STAT* If patient is at the pharmacy, call can be transferred to refill team.   1. Which medications need to be refilled? (please list name of each medication and dose if known) hydrALAZINE (APRESOLINE) 25 MG tablet  furosemide (LASIX) 20 MG tablet  2. Which pharmacy/location (including street and city if local pharmacy) is medication to be sent to? [facility]  3. Do they need a 30 day or 90 day supply? [place]

## 2020-11-21 ENCOUNTER — Encounter: Payer: Self-pay | Admitting: Cardiology

## 2020-11-21 ENCOUNTER — Ambulatory Visit: Payer: PPO | Admitting: Cardiology

## 2020-11-21 VITALS — BP 138/80 | HR 72 | Ht 68.0 in | Wt 185.4 lb

## 2020-11-21 DIAGNOSIS — I4821 Permanent atrial fibrillation: Secondary | ICD-10-CM | POA: Diagnosis not present

## 2020-11-21 DIAGNOSIS — I272 Pulmonary hypertension, unspecified: Secondary | ICD-10-CM

## 2020-11-21 DIAGNOSIS — R7989 Other specified abnormal findings of blood chemistry: Secondary | ICD-10-CM | POA: Diagnosis not present

## 2020-11-21 LAB — TSH: TSH: 0.1 — AB (ref 0.41–5.90)

## 2020-11-21 NOTE — Progress Notes (Signed)
Cardiology Office Note  Date: 11/21/2020   ID: Wanda [name], DOB October 14, 1940, MRN [id number]  PCP:  Glenda Chroman, MD  Cardiologist:  Rozann Lesches, MD Electrophysiologist:  None   Chief Complaint  Patient presents with   Cardiac follow-up    History of Present Illness: Wanda [name] is an 80 y.o. female last seen in October 2021 by Mr. Leonides Sake NP.  She is here today with her granddaughter.  She does not report any obvious change in stamina or sense of palpitations.  Still using supplemental oxygen.  She tells me that she has been diagnosed with dementia, but seems to be doing reasonably well.  Follow-up echocardiogram in October 2021 revealed LVEF 55 to 60%, normal RV contraction with moderately elevated RVSP of 46 mmHg, biatrial enlargement, mild to moderate mitral regurgitation, and sclerotic aortic valve.  She was seen recently by endocrinology for assessment of thyroid status, I reviewed the note.  I personally reviewed her ECG today which shows rate controlled atrial fibrillation.  Medications are noted below.  She does not describe any active bleeding problems on Eliquis.  She continues to follow with Dr. Woody Seller for routine lab work.  Past Medical History:  Diagnosis Date   B12 deficiency    Cancer ([place])    Celiac disease    COPD (chronic obstructive pulmonary disease) ([place])    Essential hypertension    Goiter    History of breast cancer    Status post mastectomy   History of cardiac catheterization    No significant CAD 2006   Hypertension    Mixed hyperlipidemia    Neuropathy    Paroxysmal atrial fibrillation (H[facility])    Coumadin therapy   PUD (peptic ulcer disease)    Pulmonary hypertension ([facility])    Type 2 diabetes mellitus ([place])     Past Surgical History:  Procedure Laterality Date   DILATION AND CURETTAGE OF UTERUS     HYSTEROSCOPY W/ ENDOMETRIAL ABLATION     ThermaChoice   MASTECTOMY      Current Outpatient Medications  Medication Sig Dispense  Refill   ALPRAZolam (XANAX) 1 MG tablet Take 1 mg by mouth at bedtime.     apixaban (ELIQUIS) 2.5 MG TABS tablet TAKE ONE TABLET BY MOUTH TWICE DAILY 60 tablet 3   atorvastatin (LIPITOR) 10 MG tablet Take 10 mg by mouth every evening.      carvedilol (COREG) 12.5 MG tablet Take 12.5 mg by mouth 2 (two) times daily.     citalopram (CELEXA) 20 MG tablet Take 20 mg by mouth daily.     diltiazem (DILACOR XR) 240 MG 24 hr capsule Take 240 mg by mouth daily.     furosemide (LASIX) 20 MG tablet Take 1 tablet (20 mg total) by mouth daily. 90 tablet 1   glimepiride (AMARYL) 1 MG tablet Take 1 mg by mouth daily.     hydrALAZINE (APRESOLINE) 25 MG tablet TAKE TWO (2) TABLETS BY MOUTH THREE TIMES DAILY 180 tablet 2   lisinopril (ZESTRIL) 10 MG tablet Take 1 tablet (10 mg total) by mouth daily.     trimethoprim (TRIMPEX) 100 MG tablet Take 100 mg by mouth at bedtime.     vitamin B-12 (CYANOCOBALAMIN) 1000 MCG tablet Take 1,000 mcg by mouth daily.     No current facility-administered medications for this visit.   Allergies:  Gluten, Hydromorphone hcl, and Prednisone   ROS: No palpitations or syncope.  Physical Exam: VS:  BP 138/80  Pulse 72   Ht _0  (1.727 m)   Wt 185 lb 6.4 oz (84.1 kg)   SpO2 97%   BMI 28.19 kg/m , BMI Body mass index is 28.19 kg/m.  Wt Readings from Last 3 Encounters:  11/21/20 185 lb 6.4 oz (84.1 kg)  11/14/20 187 lb 6.4 oz (85 kg)  09/27/20 171 lb (77.6 kg)    General: Patient appears comfortable at rest. HEENT: Conjunctiva and lids normal, wearing a mask. Neck: Supple, no elevated JVP or carotid bruits, no thyromegaly. Lungs: Clear to auscultation, nonlabored breathing at rest. Cardiac: Irregularly irregular, no S3, 7-9/5 basal systolic murmur, no pericardial rub. Extremities: No pitting edema.  ECG:  An ECG dated 11/18/2019 was personally reviewed today and demonstrated:  Atrial fibrillation with controlled ventricular response.  Recent Labwork: 10/26/2020: TSH  0.20   Other Studies Reviewed Today:  Echocardiogram 12/08/2019:  1. Images are limited.   2. Left ventricular ejection fraction, by estimation, is 55 to 60%. The  left ventricle has normal function. The left ventricle has no regional  wall motion abnormalities. There is mild left ventricular hypertrophy.  Left ventricular diastolic parameters  are indeterminate.   3. Right ventricular systolic function is normal. The right ventricular  size is normal. There is moderately elevated pulmonary artery systolic  pressure. The estimated right ventricular systolic pressure is 36.9 mmHg.   4. Left atrial size was severely dilated.   5. Right atrial size was moderately dilated.   6. The mitral valve is grossly normal. Mild to moderate mitral valve  regurgitation.   7. The aortic valve was not well visualized. There is moderate  calcification of the aortic valve. Aortic valve regurgitation is not  visualized. Cusp excursion appears reduced, although LVOT gradient in  normal range.   8. The inferior vena cava is dilated in size with >50% respiratory  variability, suggesting right atrial pressure of 8 mmHg.   Assessment and Plan:  1.  Permanent atrial fibrillation with CHA2DS2-VASc score of 5-6.  She is asymptomatic with heart rate control strategy, currently on combination of Coreg and Cardizem CD.  Also on Eliquis for stroke prophylaxis without spontaneous bleeding problems.  2.  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  She remains on stable diuretic regimen, also has associated pulmonary hypertension and moderate range and on supplemental oxygen.  Plan follow-up echocardiogram for surveillance.  Continue current dose of Lasix.  3.  CKD stage IIIb, routine lab work followed by PCP.  Creatinine ranging 1.5-2.0.  Medication Adjustments/Labs and Tests Ordered: Current medicines are reviewed at length with the patient today.   Concerns regarding medicines are outlined above.   Tests Ordered: Orders Placed This Encounter  Procedures   EKG 12-Lead   ECHOCARDIOGRAM COMPLETE    Medication Changes: No orders of the defined types were placed in this encounter.   Disposition:  Follow up  6 months.  Signed, Satira Sark, MD, [facility] 11/21/2020 11:28 AM    [facility] [place], [place], [place] [address] Phone: [phone]; Fax: [phone]

## 2020-11-21 NOTE — Patient Instructions (Signed)
Medication Instructions:  Your physician recommends that you continue on your current medications as directed. Please refer to the Current Medication list given to you today.  Labwork: none  Testing/Procedures: Your physician has requested that you have an echocardiogram. Echocardiography is a painless test that uses sound waves to create images of your heart. It provides your doctor with information about the size and shape of your heart and how well your heart's chambers and valves are working. This procedure takes approximately one hour. There are no restrictions for this procedure.  Follow-Up: Your physician recommends that you schedule a follow-up appointment in: 6 months  Any Other Special Instructions Will Be Listed Below (If Applicable).  If you need a refill on your cardiac medications before your next appointment, please call your pharmacy.

## 2020-11-27 ENCOUNTER — Ambulatory Visit: Payer: PPO | Admitting: Nurse Practitioner

## 2020-11-27 DIAGNOSIS — R7989 Other specified abnormal findings of blood chemistry: Secondary | ICD-10-CM

## 2020-12-01 DIAGNOSIS — R0902 Hypoxemia: Secondary | ICD-10-CM | POA: Diagnosis not present

## 2020-12-01 DIAGNOSIS — I445 Left posterior fascicular block: Secondary | ICD-10-CM | POA: Diagnosis not present

## 2020-12-17 DIAGNOSIS — R339 Retention of urine, unspecified: Secondary | ICD-10-CM | POA: Diagnosis not present

## 2020-12-26 ENCOUNTER — Ambulatory Visit (HOSPITAL_COMMUNITY): Admission: RE | Admit: 2020-12-26 | Payer: PPO | Source: Ambulatory Visit

## 2021-01-05 ENCOUNTER — Other Ambulatory Visit: Payer: Self-pay | Admitting: Cardiology

## 2021-01-05 DIAGNOSIS — I480 Paroxysmal atrial fibrillation: Secondary | ICD-10-CM

## 2021-01-07 NOTE — Telephone Encounter (Signed)
Prescription refill request for Eliquis received. Indication: Atrial Fib Last office visit: 11/21/20 Scr: 1.66 on 05/31/19 Age: 80 Weight: 84.1kg  Based on above findings Eliquis 2.31m twice daily is the appropriate dose.  Pt is past due for lab work.  Lab request mailed to patient.  Refill approved x 1 only.

## 2021-01-21 DIAGNOSIS — Z299 Encounter for prophylactic measures, unspecified: Secondary | ICD-10-CM | POA: Diagnosis not present

## 2021-01-21 DIAGNOSIS — J449 Chronic obstructive pulmonary disease, unspecified: Secondary | ICD-10-CM | POA: Diagnosis not present

## 2021-01-21 DIAGNOSIS — E1165 Type 2 diabetes mellitus with hyperglycemia: Secondary | ICD-10-CM | POA: Diagnosis not present

## 2021-01-21 DIAGNOSIS — I10 Essential (primary) hypertension: Secondary | ICD-10-CM | POA: Diagnosis not present

## 2021-01-21 DIAGNOSIS — I270 Primary pulmonary hypertension: Secondary | ICD-10-CM | POA: Diagnosis not present

## 2021-01-21 DIAGNOSIS — F419 Anxiety disorder, unspecified: Secondary | ICD-10-CM | POA: Diagnosis not present

## 2021-01-23 ENCOUNTER — Telehealth: Payer: Self-pay | Admitting: Cardiology

## 2021-01-23 DIAGNOSIS — R5383 Other fatigue: Secondary | ICD-10-CM | POA: Diagnosis not present

## 2021-01-23 DIAGNOSIS — N184 Chronic kidney disease, stage 4 (severe): Secondary | ICD-10-CM | POA: Diagnosis not present

## 2021-01-23 NOTE — Telephone Encounter (Signed)
Awaiting lab results.  Have not received yet.  Will call pt after they are received and reviewed by Dr Domenic Polite.

## 2021-01-23 NOTE — Telephone Encounter (Signed)
Pt called to advise that [facility] will be sending our office her lab results today

## 2021-01-24 ENCOUNTER — Encounter: Payer: Self-pay | Admitting: *Deleted

## 2021-02-01 ENCOUNTER — Other Ambulatory Visit: Payer: Self-pay | Admitting: Cardiology

## 2021-02-01 DIAGNOSIS — I480 Paroxysmal atrial fibrillation: Secondary | ICD-10-CM

## 2021-02-01 NOTE — Telephone Encounter (Signed)
Eliquis 2.69m refill request received. Patient is 80years old, weight-84.1kg, Crea-1.52 on 01/23/21 via scanned labs from PCP office, Diagnosis-Afib, and last seen by Dr. MDomenic Politeon 11/21/2020. Dose is appropriate based on dosing criteria. Will send in refill to requested pharmacy.

## 2021-03-01 ENCOUNTER — Other Ambulatory Visit: Payer: Self-pay | Admitting: Cardiology

## 2021-03-01 DIAGNOSIS — R339 Retention of urine, unspecified: Secondary | ICD-10-CM | POA: Diagnosis not present

## 2021-04-04 ENCOUNTER — Other Ambulatory Visit: Payer: Self-pay

## 2021-04-04 ENCOUNTER — Ambulatory Visit (INDEPENDENT_AMBULATORY_CARE_PROVIDER_SITE_OTHER): Payer: PPO | Admitting: Orthopaedic Surgery

## 2021-04-04 DIAGNOSIS — M4316 Spondylolisthesis, lumbar region: Secondary | ICD-10-CM | POA: Diagnosis not present

## 2021-04-04 NOTE — Progress Notes (Signed)
Office Visit Note   Patient: Wanda [name]           Date of Birth: 1940-12-26           MRN: [id number] Visit Date: 04/04/2021              Requested by: Glenda Chroman, MD [place],  [place] [address] PCP: Glenda Chroman, MD   Assessment & Plan: Visit Diagnoses:  1. Spondylolisthesis, lumbar region     Plan: Recheck 6 months.  She will return if she develops claudication symptoms or significant radiculopathy symptoms.  Pathophysiology discussed.  Follow-Up Instructions: Return in about 6 months (around 10/02/2021).   Orders:  No orders of the defined types were placed in this encounter.  No orders of the defined types were placed in this encounter.     Procedures: No procedures performed   Clinical Data: No additional findings.   Subjective: Chief Complaint  Patient presents with   Lower Back - Pain    HPI 81 year old female returns with pars defect central listhesis with aching pain in the lumbar that radiates in the buttocks.  She does not have any radicular symptoms on a consistent basis.  Anterolisthesis at L4-5 with pars defects grade 2 shift.  Foraminal narrowing L5-S1.  No bowel bladder symptoms no chills or fever.  Review of Systems smoking history with neuropathy stage III kidney disease, COPD hypertension PVCs atrial fibs chronic Eliquis.   Objective: Vital Signs: There were no vitals taken for this visit.  Physical Exam Constitutional:      Appearance: She is well-developed.  HENT:     Head: Normocephalic.     Right Ear: External ear normal.     Left Ear: External ear normal. There is no impacted cerumen.  Eyes:     Pupils: Pupils are equal, round, and reactive to light.  Neck:     Thyroid: No thyromegaly.     Trachea: No tracheal deviation.  Cardiovascular:     Rate and Rhythm: Normal rate.  Pulmonary:     Effort: Pulmonary effort is normal.  Abdominal:     Palpations: Abdomen is soft.  Musculoskeletal:     Cervical back: No  rigidity.  Skin:    General: Skin is warm and dry.  Neurological:     Mental Status: She is alert and oriented to person, place, and time.  Psychiatric:        Behavior: Behavior normal.    Ortho Exam patient is amatory anterior tib EHL is intact negative logroll the hips.  Specialty Comments:  No specialty comments available.  Imaging: No results found.   PMFS History: Patient Active Problem List   Diagnosis Date Noted   Spondylolisthesis, lumbar region 04/04/2021   Low back pain 10/02/2020   Acute on chronic diastolic HF (heart failure) ([place]) 10/02/2018   Pulmonary edema 10/02/2018   Type 2 diabetes with nephropathy ([place]) 10/02/2018   Chronic kidney disease, stage III (moderate) (H[facility]) 10/02/2018   Chronic respiratory failure with hypoxia ([place]) 10/02/2018   GERD (gastroesophageal reflux disease) 10/02/2018   COPD (chronic obstructive pulmonary disease) ([place]) 10/02/2018   Atrial fibrillation ([place])    Pulmonary hypertension ([place])    Long term (current) use of anticoagulants 05/03/2010   HLD (hyperlipidemia) 01/26/2009   Essential hypertension, benign 01/26/2009   PREMATURE VENTRICULAR CONTRACTIONS 01/26/2009   Past Medical History:  Diagnosis Date   B12 deficiency    Cancer ([place])    Celiac disease  COPD (chronic obstructive pulmonary disease) (H[facility])    Essential hypertension    Goiter    History of breast cancer    Status post mastectomy   History of cardiac catheterization    No significant CAD 2006   Hypertension    Mixed hyperlipidemia    Neuropathy    Paroxysmal atrial fibrillation ([facility])    Coumadin therapy   PUD (peptic ulcer disease)    Pulmonary hypertension ([place])    Type 2 diabetes mellitus ([place])     Family History  Problem Relation Age of Onset   Heart attack Mother    Celiac disease Brother    CVA Maternal Grandmother    Aortic aneurysm Maternal Grandfather     Past Surgical History:  Procedure Laterality Date   DILATION AND CURETTAGE OF  UTERUS     HYSTEROSCOPY W/ ENDOMETRIAL ABLATION     ThermaChoice   MASTECTOMY     Social History   Occupational History    Employer: RETIRED    Comment: Retired former Therapist, art at [employer]  Tobacco Use   Smoking status: Former    Types: Cigarettes    Quit date: 02/11/1995    Years since quitting: 26.1   Smokeless tobacco: Never  Vaping Use   Vaping Use: Never used  Substance and Sexual Activity   Alcohol use: Yes    Alcohol/week: 0.0 standard drinks    Comment: occ   Drug use: No   Sexual activity: Not on file

## 2021-04-08 DIAGNOSIS — R339 Retention of urine, unspecified: Secondary | ICD-10-CM | POA: Diagnosis not present

## 2021-04-15 DIAGNOSIS — I10 Essential (primary) hypertension: Secondary | ICD-10-CM | POA: Diagnosis not present

## 2021-04-15 DIAGNOSIS — Z299 Encounter for prophylactic measures, unspecified: Secondary | ICD-10-CM | POA: Diagnosis not present

## 2021-04-15 DIAGNOSIS — Z789 Other specified health status: Secondary | ICD-10-CM | POA: Diagnosis not present

## 2021-04-15 DIAGNOSIS — S8001XA Contusion of right knee, initial encounter: Secondary | ICD-10-CM | POA: Diagnosis not present

## 2021-04-24 DIAGNOSIS — L97811 Non-pressure chronic ulcer of other part of right lower leg limited to breakdown of skin: Secondary | ICD-10-CM | POA: Diagnosis not present

## 2021-04-24 DIAGNOSIS — N184 Chronic kidney disease, stage 4 (severe): Secondary | ICD-10-CM | POA: Diagnosis not present

## 2021-04-24 DIAGNOSIS — E1165 Type 2 diabetes mellitus with hyperglycemia: Secondary | ICD-10-CM | POA: Diagnosis not present

## 2021-04-24 DIAGNOSIS — I10 Essential (primary) hypertension: Secondary | ICD-10-CM | POA: Diagnosis not present

## 2021-04-24 DIAGNOSIS — Z299 Encounter for prophylactic measures, unspecified: Secondary | ICD-10-CM | POA: Diagnosis not present

## 2021-05-06 DIAGNOSIS — Z299 Encounter for prophylactic measures, unspecified: Secondary | ICD-10-CM | POA: Diagnosis not present

## 2021-05-06 DIAGNOSIS — E1165 Type 2 diabetes mellitus with hyperglycemia: Secondary | ICD-10-CM | POA: Diagnosis not present

## 2021-05-06 DIAGNOSIS — Z789 Other specified health status: Secondary | ICD-10-CM | POA: Diagnosis not present

## 2021-05-06 DIAGNOSIS — I272 Pulmonary hypertension, unspecified: Secondary | ICD-10-CM | POA: Diagnosis not present

## 2021-05-06 DIAGNOSIS — L97811 Non-pressure chronic ulcer of other part of right lower leg limited to breakdown of skin: Secondary | ICD-10-CM | POA: Diagnosis not present

## 2021-05-06 DIAGNOSIS — I10 Essential (primary) hypertension: Secondary | ICD-10-CM | POA: Diagnosis not present

## 2021-05-09 DIAGNOSIS — E7800 Pure hypercholesterolemia, unspecified: Secondary | ICD-10-CM | POA: Diagnosis not present

## 2021-05-09 DIAGNOSIS — I10 Essential (primary) hypertension: Secondary | ICD-10-CM | POA: Diagnosis not present

## 2021-05-10 DIAGNOSIS — R339 Retention of urine, unspecified: Secondary | ICD-10-CM | POA: Diagnosis not present

## 2021-05-30 ENCOUNTER — Ambulatory Visit: Payer: PPO | Admitting: Cardiology

## 2021-05-30 NOTE — Progress Notes (Deleted)
Cardiology Office Note  Date: 05/30/2021   ID: Wanda [name], DOB 1940-12-01, MRN [id number]  PCP:  Glenda Chroman, MD  Cardiologist:  Rozann Lesches, MD Electrophysiologist:  None   No chief complaint on file.   History of Present Illness: Wanda [name] is an 81 y.o. female last seen in October 2022.  Past Medical History:  Diagnosis Date   B12 deficiency    Cancer ([place])    Celiac disease    COPD (chronic obstructive pulmonary disease) ([facility])    Essential hypertension    Goiter    History of breast cancer    Status post mastectomy   History of cardiac catheterization    No significant CAD 2006   Hypertension    Mixed hyperlipidemia    Neuropathy    Paroxysmal atrial fibrillation ([facility])    Coumadin therapy   PUD (peptic ulcer disease)    Pulmonary hypertension ([facility])    Type 2 diabetes mellitus ([place])     Past Surgical History:  Procedure Laterality Date   DILATION AND CURETTAGE OF UTERUS     HYSTEROSCOPY W/ ENDOMETRIAL ABLATION     [employer]   MASTECTOMY      Current Outpatient Medications  Medication Sig Dispense Refill   ALPRAZolam (XANAX) 1 MG tablet Take 1 mg by mouth at bedtime.     apixaban (ELIQUIS) 2.5 MG TABS tablet TAKE ONE TABLET BY MOUTH TWICE DAILY 60 tablet 5   atorvastatin (LIPITOR) 10 MG tablet Take 10 mg by mouth every evening.      carvedilol (COREG) 12.5 MG tablet Take 12.5 mg by mouth 2 (two) times daily.     citalopram (CELEXA) 20 MG tablet Take 20 mg by mouth daily.     diltiazem (DILACOR XR) 240 MG 24 hr capsule Take 240 mg by mouth daily.     furosemide (LASIX) 20 MG tablet TAKE ONE TABLET BY MOUTH EVERY DAY 90 tablet 2   glimepiride (AMARYL) 1 MG tablet Take 1 mg by mouth daily.     hydrALAZINE (APRESOLINE) 25 MG tablet TAKE TWO TABLETS BY MOUTH 3 TIMES DAILY 540 tablet 1   lisinopril (ZESTRIL) 10 MG tablet Take 1 tablet (10 mg total) by mouth daily.     trimethoprim (TRIMPEX) 100 MG tablet Take 100 mg by mouth at bedtime.      vitamin B-12 (CYANOCOBALAMIN) 1000 MCG tablet Take 1,000 mcg by mouth daily.     No current facility-administered medications for this visit.   Allergies:  Gluten, Hydromorphone hcl, and Prednisone   Social History: The patient  reports that she quit smoking about 26 years ago. Her smoking use included cigarettes. She has never used smokeless tobacco. She reports current alcohol use. She reports that she does not use drugs.   Family History: The patient's family history includes Aortic aneurysm in her maternal grandfather; CVA in her maternal grandmother; Celiac disease in her brother; Heart attack in her mother.   ROS:  Please see the history of present illness. Otherwise, complete review of systems is positive for {NONE DEFAULTED:18576}.  All other systems are reviewed and negative.   Physical Exam: VS:  There were no vitals taken for this visit., BMI There is no height or weight on file to calculate BMI.  Wt Readings from Last 3 Encounters:  11/21/20 185 lb 6.4 oz (84.1 kg)  11/14/20 187 lb 6.4 oz (85 kg)  09/27/20 171 lb (77.6 kg)    General: Patient appears comfortable at rest.  HEENT: Conjunctiva and lids normal, oropharynx clear with moist mucosa. Neck: Supple, no elevated JVP or carotid bruits, no thyromegaly. Lungs: Clear to auscultation, nonlabored breathing at rest. Cardiac: Regular rate and rhythm, no S3 or significant systolic murmur, no pericardial rub. Abdomen: Soft, nontender, no hepatomegaly, bowel sounds present, no guarding or rebound. Extremities: No pitting edema, distal pulses 2+. Skin: Warm and dry. Musculoskeletal: No kyphosis. Neuropsychiatric: Alert and oriented x3, affect grossly appropriate.  ECG:  An ECG dated 11/21/2020 was personally reviewed today and demonstrated:  Atrial fibrillation.  Recent Labwork: 11/21/2020: TSH 0.19 January 2021: Hemoglobin 10.8, platelets 192, BUN 19, creatinine 1.52, potassium 3.9  Other Studies Reviewed  Today:  Echocardiogram 12/08/2019:  1. Images are limited.   2. Left ventricular ejection fraction, by estimation, is 55 to 60%. The  left ventricle has normal function. The left ventricle has no regional  wall motion abnormalities. There is mild left ventricular hypertrophy.  Left ventricular diastolic parameters  are indeterminate.   3. Right ventricular systolic function is normal. The right ventricular  size is normal. There is moderately elevated pulmonary artery systolic  pressure. The estimated right ventricular systolic pressure is 47.2 mmHg.   4. Left atrial size was severely dilated.   5. Right atrial size was moderately dilated.   6. The mitral valve is grossly normal. Mild to moderate mitral valve  regurgitation.   7. The aortic valve was not well visualized. There is moderate  calcification of the aortic valve. Aortic valve regurgitation is not  visualized. Cusp excursion appears reduced, although LVOT gradient in  normal range.   8. The inferior vena cava is dilated in size with >50% respiratory  variability, suggesting right atrial pressure of 8 mmHg.   Assessment and Plan:    Medication Adjustments/Labs and Tests Ordered: Current medicines are reviewed at length with the patient today.  Concerns regarding medicines are outlined above.   Tests Ordered: No orders of the defined types were placed in this encounter.   Medication Changes: No orders of the defined types were placed in this encounter.   Disposition:  Follow up {follow up:15908}  Signed, Satira Sark, MD, [facility] 05/30/2021 10:10 AM    [facility] [address] [facility] [place], [place],  [id number] Phone: [phone]; Fax: [phone]

## 2021-06-01 DIAGNOSIS — R0902 Hypoxemia: Secondary | ICD-10-CM | POA: Diagnosis not present

## 2021-06-01 DIAGNOSIS — I445 Left posterior fascicular block: Secondary | ICD-10-CM | POA: Diagnosis not present

## 2021-06-10 DIAGNOSIS — R339 Retention of urine, unspecified: Secondary | ICD-10-CM | POA: Diagnosis not present

## 2021-06-22 ENCOUNTER — Other Ambulatory Visit: Payer: Self-pay | Admitting: Cardiology

## 2021-07-01 DIAGNOSIS — R0902 Hypoxemia: Secondary | ICD-10-CM | POA: Diagnosis not present

## 2021-07-01 DIAGNOSIS — I445 Left posterior fascicular block: Secondary | ICD-10-CM | POA: Diagnosis not present

## 2021-07-02 DIAGNOSIS — R339 Retention of urine, unspecified: Secondary | ICD-10-CM | POA: Diagnosis not present

## 2021-07-22 DIAGNOSIS — Z6828 Body mass index (BMI) 28.0-28.9, adult: Secondary | ICD-10-CM | POA: Diagnosis not present

## 2021-07-22 DIAGNOSIS — F419 Anxiety disorder, unspecified: Secondary | ICD-10-CM | POA: Diagnosis not present

## 2021-07-22 DIAGNOSIS — Z299 Encounter for prophylactic measures, unspecified: Secondary | ICD-10-CM | POA: Diagnosis not present

## 2021-07-23 DIAGNOSIS — Z880 Allergy status to penicillin: Secondary | ICD-10-CM | POA: Diagnosis not present

## 2021-07-23 DIAGNOSIS — R52 Pain, unspecified: Secondary | ICD-10-CM | POA: Diagnosis not present

## 2021-07-23 DIAGNOSIS — R531 Weakness: Secondary | ICD-10-CM | POA: Diagnosis not present

## 2021-07-23 DIAGNOSIS — R1110 Vomiting, unspecified: Secondary | ICD-10-CM | POA: Diagnosis not present

## 2021-07-23 DIAGNOSIS — I469 Cardiac arrest, cause unspecified: Secondary | ICD-10-CM | POA: Diagnosis not present

## 2021-07-23 DIAGNOSIS — R112 Nausea with vomiting, unspecified: Secondary | ICD-10-CM | POA: Diagnosis not present

## 2021-07-23 DIAGNOSIS — I959 Hypotension, unspecified: Secondary | ICD-10-CM | POA: Diagnosis not present

## 2021-07-24 ENCOUNTER — Telehealth: Payer: Self-pay | Admitting: *Deleted

## 2021-08-10 DEATH — deceased
